# Patient Record
Sex: Male | Born: 1960 | Race: White | Hispanic: No | Smoking: Former smoker
Health system: Southern US, Community
[De-identification: ages and names within clinical notes are randomized; demographics above are authoritative.]

## PROBLEM LIST (undated history)

## (undated) ENCOUNTER — Emergency Department (HOSPITAL_COMMUNITY)

## (undated) DIAGNOSIS — C801 Malignant (primary) neoplasm, unspecified: Secondary | ICD-10-CM

## (undated) DIAGNOSIS — G8929 Other chronic pain: Secondary | ICD-10-CM

## (undated) DIAGNOSIS — J189 Pneumonia, unspecified organism: Secondary | ICD-10-CM

## (undated) DIAGNOSIS — F1910 Other psychoactive substance abuse, uncomplicated: Secondary | ICD-10-CM

## (undated) DIAGNOSIS — K089 Disorder of teeth and supporting structures, unspecified: Secondary | ICD-10-CM

## (undated) DIAGNOSIS — J449 Chronic obstructive pulmonary disease, unspecified: Secondary | ICD-10-CM

## (undated) HISTORY — PX: BACK SURGERY: SHX140

## (undated) HISTORY — PX: OTHER SURGICAL HISTORY: SHX169

---

## 2002-12-25 ENCOUNTER — Encounter: Payer: Self-pay | Admitting: Family Medicine

## 2002-12-25 ENCOUNTER — Ambulatory Visit (HOSPITAL_COMMUNITY): Admission: RE | Admit: 2002-12-25 | Discharge: 2002-12-25 | Payer: Self-pay | Admitting: Family Medicine

## 2004-11-29 ENCOUNTER — Emergency Department (HOSPITAL_COMMUNITY): Admission: EM | Admit: 2004-11-29 | Discharge: 2004-11-29 | Payer: Self-pay | Admitting: Emergency Medicine

## 2004-12-01 ENCOUNTER — Ambulatory Visit (HOSPITAL_BASED_OUTPATIENT_CLINIC_OR_DEPARTMENT_OTHER): Admission: RE | Admit: 2004-12-01 | Discharge: 2004-12-01 | Payer: Self-pay | Admitting: *Deleted

## 2004-12-01 ENCOUNTER — Ambulatory Visit (HOSPITAL_COMMUNITY): Admission: RE | Admit: 2004-12-01 | Discharge: 2004-12-01 | Payer: Self-pay | Admitting: *Deleted

## 2007-01-08 ENCOUNTER — Ambulatory Visit (HOSPITAL_COMMUNITY): Admission: RE | Admit: 2007-01-08 | Discharge: 2007-01-08 | Payer: Self-pay | Admitting: Family Medicine

## 2007-12-18 ENCOUNTER — Ambulatory Visit (HOSPITAL_COMMUNITY): Admission: RE | Admit: 2007-12-18 | Discharge: 2007-12-18 | Payer: Self-pay | Admitting: Family Medicine

## 2008-01-14 ENCOUNTER — Ambulatory Visit (HOSPITAL_COMMUNITY): Admission: RE | Admit: 2008-01-14 | Discharge: 2008-01-14 | Payer: Self-pay | Admitting: Family Medicine

## 2008-03-25 ENCOUNTER — Ambulatory Visit (HOSPITAL_COMMUNITY): Admission: RE | Admit: 2008-03-25 | Discharge: 2008-03-26 | Payer: Self-pay | Admitting: Neurosurgery

## 2008-09-14 ENCOUNTER — Inpatient Hospital Stay (HOSPITAL_COMMUNITY): Admission: EM | Admit: 2008-09-14 | Discharge: 2008-09-17 | Payer: Self-pay | Admitting: Emergency Medicine

## 2010-11-11 ENCOUNTER — Ambulatory Visit (HOSPITAL_COMMUNITY)
Admission: RE | Admit: 2010-11-11 | Discharge: 2010-11-11 | Payer: Self-pay | Source: Home / Self Care | Attending: Pulmonary Disease | Admitting: Pulmonary Disease
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
## 2011-03-01 NOTE — Op Note (Signed)
NAMEMARQUAY, Jerome Daniel                 ACCOUNT NO.:  [id number]   MEDICAL RECORD NO.:  [id number]          PATIENT TYPE:  OIB   LOCATION:  3029                         FACILITY:  [facility]   PHYSICIAN:  Payton Doughty, M.D.      DATE OF BIRTH:  1960/12/25   DATE OF PROCEDURE:  03/25/2008  DATE OF DISCHARGE:                               OPERATIVE REPORT   PREOPERATIVE DIAGNOSES:  1. Spondylosis L4-L5.  2. Spondylosis and herniated disk, L5-S1.   POSTOPERATIVE DIAGNOSES:  1. Spondylosis L4-L5.  2. Spondylosis and herniated disk, L5-S1.   PROCEDURES:  L4-L5, L5-S1 laminotomy, foraminotomy, and left L5-S1  diskectomy.   DICTATING DOCTOR:  Payton Doughty, MD   ANESTHESIA:  General endotracheal.   PREPARATION:  Prepped with alcohol wipe.   COMPLICATIONS:  None.   NURSE ASSISTANT:  Evon Slack   DOCTOR ASSISTANT:  Hewitt Shorts, MD   BODY TEXT:  A 50 year old gentleman with left L5-S1 radiculopathy, taken  to operating room, smoothly induced and intubated, placed prone on the  operating table.  Following shave, prep, and drape in the usual sterile  fashion, skin was infiltrated with 1% lidocaine with 1:400,000  epinephrine.  Skin was incised from the top of L4 to the bottom of L5,  and the lamina of L4-L5 were exposed on the left side in subperiosteal  plane.  An intraoperative x-ray confirmed correct level.  Having  confirmed correct level, hemi/semi-laminotomy was carried out at both L4  and L5 using the high-speed drill to the top of ligamentum flavum, which  was then removed with a Kerrison punch.  The laminotomy defect widened  laterally.  The S1 nerve root was identified, which was gently retracted  medially underneath.  There was found an annular tear with protruding  disk material.  This was removed without difficulty.  Disk space was  carefully explored.  No graspable fragments removed.  The anterior  epidural space was explored as well as neuroforamen found to be free.  At L4-L5, once the ligament have been removed, there was some bulging  disk demonstrated, but no frank herniated disk.  Generous foraminotomy  was carried out over the L5 root.  The nerve was explored in all  quadrant and found to be free.  The wound was irrigated.  Hemostasis  assured.  Depo-  Medrol soaked pad was used to fill both laminotomy defects and  successive layers of 0 Vicryl, 2-0 Vicryl, and 3-0 nylon were used to  close.  Betadine and Telfa dressing was applied, made occlusive to the  OpSite.  The patient returned to the recovery room in good condition.           ______________________________  Payton Doughty, M.D.     MWR/MEDQ  D:  03/25/2008  T:  03/26/2008  Job:  [id number]

## 2011-03-04 NOTE — Op Note (Signed)
NAMEMYLIN, HIRANO                 ACCOUNT NO.:  [id number]   MEDICAL RECORD NO.:  [id number]          PATIENT TYPE:  AMB   LOCATION:  DSC                          FACILITY:  [facility]   PHYSICIAN:  Tennis Must Meyerdierks, M.D.DATE OF BIRTH:  1961/01/13   DATE OF PROCEDURE:  12/01/2004  DATE OF DISCHARGE:                                 OPERATIVE REPORT   PREOPERATIVE DIAGNOSIS:  Comminuted intra-articular fracture distal phalanx,  right thumb.   POSTOPERATIVE DIAGNOSIS:  Comminuted intra-articular fracture distal  phalanx, right thumb.   PROCEDURE:  Closed reduction and percutaneous pinning of distal phalanx  fracture, right thumb.   SURGEON:  Lowell Bouton, M.D.   ANESTHESIA:  General.   OPERATIVE FINDINGS:  The patient had a fracture that extended through the  radial condyle of the distal phalanx and also a mallet type avulsion  fracture dorsally.  The distal phalanx was subluxed volarly. There was  significant comminution of the fracture at the IP joint.   PROCEDURE:  Under general anesthesia, the right hand was prepped and draped  in the usual fashion and under x-ray control, a 0.045 K-wire was placed  longitudinally through the distal phalanx.  The IP joint was reduced and the  K-wire was passed across the IP joint. X-ray showed good alignment. A second  0.045 K-wire was then used through the radial condyle fracture and it was  pinned from radial to ulnar to the distal phalanx. A third 0.045 K-wire was  then used dorsally to pin the dorsal fragment back to the distal phalanx.  The pins were bent over and left protruding from the skin. X-ray showed good  alignment. Then 0.5% Marcaine digital block was inserted in the thumb for  pain control. The patient was splinted with an Alumafoam splint. He went to  the recovery room awake, stable, in good condition.      EMM/MEDQ  D:  12/01/2004  T:  12/01/2004  Job:  [id number]   cc:   Teola Bradley, M.D.  [phone]  [place] [address]  Fax: [phone]

## 2011-04-04 ENCOUNTER — Other Ambulatory Visit (HOSPITAL_COMMUNITY): Payer: Self-pay | Admitting: Pulmonary Disease

## 2011-04-04 DIAGNOSIS — R911 Solitary pulmonary nodule: Secondary | ICD-10-CM

## 2011-04-25 ENCOUNTER — Ambulatory Visit (HOSPITAL_COMMUNITY)
Admission: RE | Admit: 2011-04-25 | Discharge: 2011-04-25 | Disposition: A | Discharge status: Home / Self Care | Payer: PRIVATE HEALTH INSURANCE | Source: Ambulatory Visit | Attending: Pulmonary Disease | Admitting: Pulmonary Disease

## 2011-04-25 DIAGNOSIS — J984 Other disorders of lung: Secondary | ICD-10-CM | POA: Insufficient documentation

## 2011-04-25 DIAGNOSIS — R911 Solitary pulmonary nodule: Secondary | ICD-10-CM

## 2011-07-14 LAB — CBC
HCT: 43.5
Hemoglobin: 15.2
RDW: 12.8
WBC: 7

## 2011-07-14 LAB — URINALYSIS, ROUTINE W REFLEX MICROSCOPIC
Bilirubin Urine: NEGATIVE
Hgb urine dipstick: NEGATIVE
Ketones, ur: NEGATIVE
Specific Gravity, Urine: 1.029

## 2011-07-14 LAB — COMPREHENSIVE METABOLIC PANEL
ALT: 37
AST: 24
Alkaline Phosphatase: 42
BUN: 11
Calcium: 9.4
GFR calc non Af Amer: >60
Sodium: 135

## 2011-07-14 LAB — PROTIME-INR
INR: 0.9
Prothrombin Time: 12.1

## 2011-07-14 LAB — DIFFERENTIAL
Basophils Absolute: 0.1
Eosinophils Absolute: 0.1
Eosinophils Relative: 2
Lymphocytes Relative: 26
Lymphs Abs: 1.8
Monocytes Absolute: 0.6
Neutro Abs: 4.4
Neutrophils Relative %: 64

## 2011-07-14 LAB — APTT: aPTT: 33

## 2011-07-19 LAB — CBC
HCT: 39.6 % (ref 39.0–52.0)
MCHC: 34.3 g/dL (ref 30.0–36.0)
MCV: 96.8 fL (ref 78.0–100.0)
MCV: 99.1 fL (ref 78.0–100.0)
Platelets: 233 10*3/uL (ref 150–400)
RBC: 3.68 MIL/uL — ABNORMAL LOW (ref 4.22–5.81)
RDW: 12.7 % (ref 11.5–15.5)
RDW: 13.3 % (ref 11.5–15.5)
WBC: 7.1 10*3/uL (ref 4.0–10.5)
WBC: 7.2 10*3/uL (ref 4.0–10.5)

## 2011-07-19 LAB — TYPE AND SCREEN: ABO/RH(D): O NEG

## 2011-07-19 LAB — POCT I-STAT, CHEM 8
Calcium, Ion: 1.12 mmol/L (ref 1.12–1.32)
Chloride: 105 mEq/L (ref 96–112)
Potassium: 4.3 mEq/L (ref 3.5–5.1)
Sodium: 138 mEq/L (ref 135–145)
TCO2: 24 mmol/L (ref 0–100)

## 2011-07-19 LAB — BASIC METABOLIC PANEL
BUN: 7 mg/dL (ref 6–23)
CO2: 25 mEq/L (ref 19–32)
Calcium: 7.6 mg/dL — ABNORMAL LOW (ref 8.4–10.5)
Sodium: 134 mEq/L — ABNORMAL LOW (ref 135–145)

## 2011-07-19 LAB — ABO/RH: ABO/RH(D): O NEG

## 2011-07-22 LAB — BASIC METABOLIC PANEL
CO2: 27 mEq/L (ref 19–32)
Calcium: 8.2 mg/dL — ABNORMAL LOW (ref 8.4–10.5)
Chloride: 101 mEq/L (ref 96–112)
Creatinine, Ser: 0.85 mg/dL (ref 0.4–1.5)
GFR calc Af Amer: >60 mL/min (ref >60)
Sodium: 136 mEq/L (ref 135–145)

## 2011-07-22 LAB — CBC
Hemoglobin: 12.1 g/dL — ABNORMAL LOW (ref 13.0–17.0)
MCHC: 34.5 g/dL (ref 30.0–36.0)
RBC: 3.55 MIL/uL — ABNORMAL LOW (ref 4.22–5.81)
WBC: 6.1 10*3/uL (ref 4.0–10.5)

## 2011-11-06 DIAGNOSIS — Z720 Tobacco use: Secondary | ICD-10-CM | POA: Insufficient documentation

## 2012-07-24 ENCOUNTER — Emergency Department (HOSPITAL_COMMUNITY)
Admission: EM | Admit: 2012-07-24 | Discharge: 2012-07-24 | Disposition: A | Discharge status: Home / Self Care | Payer: Self-pay | Attending: Emergency Medicine | Admitting: Emergency Medicine

## 2012-07-24 ENCOUNTER — Encounter (HOSPITAL_COMMUNITY): Payer: Self-pay | Admitting: *Deleted

## 2012-07-24 DIAGNOSIS — F1010 Alcohol abuse, uncomplicated: Secondary | ICD-10-CM | POA: Insufficient documentation

## 2012-07-24 DIAGNOSIS — F1310 Sedative, hypnotic or anxiolytic abuse, uncomplicated: Secondary | ICD-10-CM | POA: Insufficient documentation

## 2012-07-24 DIAGNOSIS — F1110 Opioid abuse, uncomplicated: Secondary | ICD-10-CM

## 2012-07-24 DIAGNOSIS — J449 Chronic obstructive pulmonary disease, unspecified: Secondary | ICD-10-CM | POA: Insufficient documentation

## 2012-07-24 DIAGNOSIS — J4489 Other specified chronic obstructive pulmonary disease: Secondary | ICD-10-CM | POA: Insufficient documentation

## 2012-07-24 HISTORY — DX: Chronic obstructive pulmonary disease, unspecified: J44.9

## 2012-07-24 LAB — CBC WITH DIFFERENTIAL/PLATELET
Eosinophils Absolute: 0.4 10*3/uL (ref 0.0–0.7)
Eosinophils Relative: 6 % — ABNORMAL HIGH (ref 0–5)
HCT: 44.1 % (ref 39.0–52.0)
Lymphocytes Relative: 34 % (ref 12–46)
Lymphs Abs: 2.2 10*3/uL (ref 0.7–4.0)
MCH: 34.1 pg — ABNORMAL HIGH (ref 26.0–34.0)
MCV: 102.3 fL — ABNORMAL HIGH (ref 78.0–100.0)
Monocytes Absolute: 0.3 10*3/uL (ref 0.1–1.0)
Monocytes Relative: 5 % (ref 3–12)
RBC: 4.31 MIL/uL (ref 4.22–5.81)
WBC: 6.6 10*3/uL (ref 4.0–10.5)

## 2012-07-24 LAB — COMPREHENSIVE METABOLIC PANEL
ALT: 30 U/L (ref 0–53)
BUN: 7 mg/dL (ref 6–23)
CO2: 30 mEq/L (ref 19–32)
Calcium: 9.4 mg/dL (ref 8.4–10.5)
Creatinine, Ser: 0.89 mg/dL (ref 0.50–1.35)
GFR calc Af Amer: >90 mL/min (ref >90)
GFR calc non Af Amer: >90 mL/min (ref >90)
Glucose, Bld: 100 mg/dL — ABNORMAL HIGH (ref 70–99)
Total Protein: 7.3 g/dL (ref 6.0–8.3)

## 2012-07-24 LAB — ETHANOL: Alcohol, Ethyl (B): 12 mg/dL — ABNORMAL HIGH (ref 0–11)

## 2012-07-24 LAB — RAPID URINE DRUG SCREEN, HOSP PERFORMED
Benzodiazepines: NOT DETECTED
Cocaine: NOT DETECTED
Opiates: NOT DETECTED

## 2012-07-24 MED ORDER — IBUPROFEN 800 MG PO TABS
800.0000 mg | ORAL_TABLET | Freq: Once | ORAL | Status: AC
  Administered 2012-07-24: 800 mg via ORAL
  Filled 2012-07-24: qty 1

## 2012-07-24 NOTE — ED Notes (Signed)
Wants to get off pain pills,tylox,

## 2012-07-24 NOTE — Progress Notes (Signed)
PT ACCEPTED AT [facility] BY [facility]. CALLED [facility], SPOKE WITH KATHY RECEIVED AUTHORIZATION # [id number].  INFORMED SANDY OF AUTHORIZATION #.  PT IS READY FOR D/C AND IS BEING TRANSPORTED TO [facility] BY HIS GIRLFRIEND
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
## 2012-07-24 NOTE — ED Notes (Signed)
Pt accepted at [facility] per Samson Frederic with ACT team
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
## 2012-11-24 ENCOUNTER — Emergency Department (HOSPITAL_COMMUNITY): Payer: Self-pay

## 2012-11-24 ENCOUNTER — Emergency Department (HOSPITAL_COMMUNITY)
Admission: EM | Admit: 2012-11-24 | Discharge: 2012-11-24 | Disposition: A | Discharge status: Home / Self Care | Payer: Self-pay | Attending: Emergency Medicine | Admitting: Emergency Medicine

## 2012-11-24 ENCOUNTER — Encounter (HOSPITAL_COMMUNITY): Payer: Self-pay

## 2012-11-24 DIAGNOSIS — Z79899 Other long term (current) drug therapy: Secondary | ICD-10-CM | POA: Insufficient documentation

## 2012-11-24 DIAGNOSIS — Z87891 Personal history of nicotine dependence: Secondary | ICD-10-CM | POA: Insufficient documentation

## 2012-11-24 DIAGNOSIS — Z87828 Personal history of other (healed) physical injury and trauma: Secondary | ICD-10-CM | POA: Insufficient documentation

## 2012-11-24 DIAGNOSIS — R059 Cough, unspecified: Secondary | ICD-10-CM | POA: Insufficient documentation

## 2012-11-24 DIAGNOSIS — J441 Chronic obstructive pulmonary disease with (acute) exacerbation: Secondary | ICD-10-CM | POA: Insufficient documentation

## 2012-11-24 DIAGNOSIS — R062 Wheezing: Secondary | ICD-10-CM | POA: Insufficient documentation

## 2012-11-24 DIAGNOSIS — R05 Cough: Secondary | ICD-10-CM | POA: Insufficient documentation

## 2012-11-24 LAB — CBC WITH DIFFERENTIAL/PLATELET
Eosinophils Relative: 8 % — ABNORMAL HIGH (ref 0–5)
Hemoglobin: 17.7 g/dL — ABNORMAL HIGH (ref 13.0–17.0)
Lymphocytes Relative: 31 % (ref 12–46)
Lymphs Abs: 2.2 10*3/uL (ref 0.7–4.0)
MCV: 101.1 fL — ABNORMAL HIGH (ref 78.0–100.0)
Monocytes Relative: 5 % (ref 3–12)
Platelets: 205 10*3/uL (ref 150–400)
RBC: 5.27 MIL/uL (ref 4.22–5.81)
WBC: 7.2 10*3/uL (ref 4.0–10.5)

## 2012-11-24 LAB — COMPREHENSIVE METABOLIC PANEL
ALT: 21 U/L (ref 0–53)
Alkaline Phosphatase: 63 U/L (ref 39–117)
CO2: 31 mEq/L (ref 19–32)
GFR calc Af Amer: >90 mL/min (ref >90)
GFR calc non Af Amer: >90 mL/min (ref >90)
Glucose, Bld: 101 mg/dL — ABNORMAL HIGH (ref 70–99)
Potassium: 4.3 mEq/L (ref 3.5–5.1)
Sodium: 138 mEq/L (ref 135–145)

## 2012-11-24 LAB — URINALYSIS, ROUTINE W REFLEX MICROSCOPIC
Bilirubin Urine: NEGATIVE
Ketones, ur: NEGATIVE mg/dL
Nitrite: NEGATIVE
Protein, ur: NEGATIVE mg/dL
Urobilinogen, UA: 0.2 mg/dL (ref 0.0–1.0)

## 2012-11-24 MED ORDER — PREDNISONE 20 MG PO TABS: ORAL_TABLET | ORAL | Status: DC

## 2012-11-24 MED ORDER — OXYCODONE-ACETAMINOPHEN 5-325 MG PO TABS
1.0000 | ORAL_TABLET | Freq: Once | ORAL | Status: AC
  Administered 2012-11-24: 1 via ORAL
  Filled 2012-11-24: qty 1

## 2012-11-24 MED ORDER — ALBUTEROL SULFATE HFA 108 (90 BASE) MCG/ACT IN AERS
2.0000 | INHALATION_SPRAY | RESPIRATORY_TRACT | Status: DC | PRN
  Administered 2012-11-24: 2 via RESPIRATORY_TRACT
  Filled 2012-11-24: qty 6.7

## 2012-11-24 MED ORDER — METHYLPREDNISOLONE SODIUM SUCC 125 MG IJ SOLR
125.0000 mg | Freq: Once | INTRAMUSCULAR | Status: AC
  Administered 2012-11-24: 125 mg via INTRAVENOUS
  Filled 2012-11-24: qty 2

## 2012-11-24 MED ORDER — ALBUTEROL (5 MG/ML) CONTINUOUS INHALATION SOLN
10.0000 mg/h | INHALATION_SOLUTION | RESPIRATORY_TRACT | Status: DC
  Administered 2012-11-24: 10 mg/h via RESPIRATORY_TRACT
  Filled 2012-11-24: qty 20

## 2012-11-24 MED ORDER — SODIUM CHLORIDE 0.9 % IV SOLN
INTRAVENOUS | Status: DC
  Administered 2012-11-24: 150 mL/h via INTRAVENOUS

## 2012-11-24 NOTE — ED Notes (Signed)
Pt c/o SOB since last night.  Reports " little" productive cough.  Denies pain or fever.  Pt has audible wheezing.

## 2012-11-24 NOTE — ED Notes (Signed)
Pt states that he is feeling better, pt and family updated on plan of care,
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
## 2012-11-24 NOTE — ED Notes (Signed)
Pt returned from xray, requesting pain medication for a headache, pt states "i need oxycodone or something strong,  advil or anything like that will not cut it for me", Dr Ignacia Palma notified additional orders given

## 2012-11-24 NOTE — ED Notes (Signed)
Pt arrives to er with c/o sob, wheezing, pt states that he first noticed having to use his inhaler more over the past month, cough is productive at times, unsure of any color, states "I don't look at it", during the night and this am inhaler did not seem to help with the wheezing, pt has audible expiratory wheezing noted on arrival to tx room,. Dr. Ignacia Palma at bedside,

## 2013-01-04 ENCOUNTER — Encounter (HOSPITAL_COMMUNITY): Payer: Self-pay | Admitting: *Deleted

## 2013-01-04 ENCOUNTER — Emergency Department (HOSPITAL_COMMUNITY)
Admission: EM | Admit: 2013-01-04 | Discharge: 2013-01-04 | Disposition: A | Discharge status: Home / Self Care | Payer: Self-pay | Attending: Emergency Medicine | Admitting: Emergency Medicine

## 2013-01-04 DIAGNOSIS — Z87891 Personal history of nicotine dependence: Secondary | ICD-10-CM | POA: Insufficient documentation

## 2013-01-04 DIAGNOSIS — Z79899 Other long term (current) drug therapy: Secondary | ICD-10-CM | POA: Insufficient documentation

## 2013-01-04 DIAGNOSIS — J449 Chronic obstructive pulmonary disease, unspecified: Secondary | ICD-10-CM

## 2013-01-04 DIAGNOSIS — R059 Cough, unspecified: Secondary | ICD-10-CM | POA: Insufficient documentation

## 2013-01-04 DIAGNOSIS — R05 Cough: Secondary | ICD-10-CM | POA: Insufficient documentation

## 2013-01-04 DIAGNOSIS — J441 Chronic obstructive pulmonary disease with (acute) exacerbation: Secondary | ICD-10-CM | POA: Insufficient documentation

## 2013-01-04 MED ORDER — PREDNISONE 50 MG PO TABS
60.0000 mg | ORAL_TABLET | Freq: Once | ORAL | Status: AC
  Administered 2013-01-04: 60 mg via ORAL
  Filled 2013-01-04: qty 1

## 2013-01-04 MED ORDER — ALBUTEROL SULFATE HFA 108 (90 BASE) MCG/ACT IN AERS
2.0000 | INHALATION_SPRAY | Freq: Once | RESPIRATORY_TRACT | Status: AC
  Administered 2013-01-04: 2 via RESPIRATORY_TRACT
  Filled 2013-01-04: qty 6.7

## 2013-01-04 MED ORDER — PREDNISONE 20 MG PO TABS: ORAL_TABLET | ORAL | Status: DC

## 2013-01-04 NOTE — ED Notes (Signed)
Once Respiratory did inhaler, pt left without receiving d/c instructions

## 2013-01-04 NOTE — ED Notes (Signed)
Pt states he has ran out of inhaler and usually uses 3-4 x/day, + cough and productive at times per pt, exp. Wheezing noted in all 4 fields

## 2013-01-04 NOTE — ED Notes (Signed)
Patient was informed that there is another MD that has just arrived and he shouldn't have to wait to much longer to see a physician.
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
## 2013-01-04 NOTE — ED Notes (Signed)
Patient is upset that a MD has not been in his room at this time. RN made aware.

## 2013-01-04 NOTE — ED Notes (Signed)
Ran out of inhaler today.  States here for another one.  Has rx for same at pharmacy but does not have the money to pay.  Speaking clear full sentences in triage.  No resp distress noted.

## 2013-01-04 NOTE — ED Notes (Signed)
MD at bedside. 

## 2013-03-05 ENCOUNTER — Emergency Department (HOSPITAL_COMMUNITY): Payer: Self-pay

## 2013-03-05 ENCOUNTER — Emergency Department (HOSPITAL_COMMUNITY)
Admission: EM | Admit: 2013-03-05 | Discharge: 2013-03-05 | Disposition: A | Discharge status: Home / Self Care | Payer: Self-pay | Attending: Emergency Medicine | Admitting: Emergency Medicine

## 2013-03-05 ENCOUNTER — Encounter (HOSPITAL_COMMUNITY): Payer: Self-pay | Admitting: *Deleted

## 2013-03-05 DIAGNOSIS — J441 Chronic obstructive pulmonary disease with (acute) exacerbation: Secondary | ICD-10-CM | POA: Insufficient documentation

## 2013-03-05 DIAGNOSIS — R05 Cough: Secondary | ICD-10-CM | POA: Insufficient documentation

## 2013-03-05 DIAGNOSIS — F17200 Nicotine dependence, unspecified, uncomplicated: Secondary | ICD-10-CM | POA: Insufficient documentation

## 2013-03-05 DIAGNOSIS — Z87828 Personal history of other (healed) physical injury and trauma: Secondary | ICD-10-CM | POA: Insufficient documentation

## 2013-03-05 DIAGNOSIS — J449 Chronic obstructive pulmonary disease, unspecified: Secondary | ICD-10-CM

## 2013-03-05 DIAGNOSIS — R059 Cough, unspecified: Secondary | ICD-10-CM | POA: Insufficient documentation

## 2013-03-05 DIAGNOSIS — Z79899 Other long term (current) drug therapy: Secondary | ICD-10-CM | POA: Insufficient documentation

## 2013-03-05 LAB — CBC WITH DIFFERENTIAL/PLATELET
Eosinophils Absolute: 0.7 10*3/uL (ref 0.0–0.7)
Eosinophils Relative: 7 % — ABNORMAL HIGH (ref 0–5)
HCT: 49.7 % (ref 39.0–52.0)
Hemoglobin: 16.7 g/dL (ref 13.0–17.0)
Lymphs Abs: 3.3 10*3/uL (ref 0.7–4.0)
MCH: 32.8 pg (ref 26.0–34.0)
MCV: 97.6 fL (ref 78.0–100.0)
Monocytes Relative: 5 % (ref 3–12)
RBC: 5.09 MIL/uL (ref 4.22–5.81)

## 2013-03-05 LAB — COMPREHENSIVE METABOLIC PANEL
Alkaline Phosphatase: 62 U/L (ref 39–117)
BUN: 11 mg/dL (ref 6–23)
Calcium: 8.6 mg/dL (ref 8.4–10.5)
GFR calc Af Amer: >90 mL/min (ref >90)
Glucose, Bld: 88 mg/dL (ref 70–99)
Potassium: 3.7 mEq/L (ref 3.5–5.1)
Total Protein: 7.5 g/dL (ref 6.0–8.3)

## 2013-03-05 MED ORDER — ALBUTEROL SULFATE HFA 108 (90 BASE) MCG/ACT IN AERS
2.0000 | INHALATION_SPRAY | RESPIRATORY_TRACT | Status: DC | PRN
  Filled 2013-03-05: qty 6.7

## 2013-03-05 MED ORDER — PREDNISONE 10 MG PO TABS: 20.0000 mg | ORAL_TABLET | Freq: Every day | ORAL | Status: DC

## 2013-03-05 MED ORDER — ALBUTEROL SULFATE HFA 108 (90 BASE) MCG/ACT IN AERS: 1.0000 | INHALATION_SPRAY | Freq: Four times a day (QID) | RESPIRATORY_TRACT | Status: DC | PRN

## 2013-03-05 MED ORDER — IPRATROPIUM BROMIDE 0.02 % IN SOLN
0.5000 mg | Freq: Once | RESPIRATORY_TRACT | Status: AC
  Administered 2013-03-05: 0.5 mg via RESPIRATORY_TRACT
  Filled 2013-03-05: qty 2.5

## 2013-03-05 MED ORDER — ALBUTEROL SULFATE (5 MG/ML) 0.5% IN NEBU
2.5000 mg | INHALATION_SOLUTION | Freq: Once | RESPIRATORY_TRACT | Status: AC
  Administered 2013-03-05: 2.5 mg via RESPIRATORY_TRACT
  Filled 2013-03-05: qty 0.5

## 2013-03-05 MED ORDER — AMOXICILLIN 500 MG PO CAPS: 500.0000 mg | ORAL_CAPSULE | Freq: Three times a day (TID) | ORAL | Status: DC

## 2013-03-05 MED ORDER — METHYLPREDNISOLONE SODIUM SUCC 125 MG IJ SOLR
125.0000 mg | Freq: Once | INTRAMUSCULAR | Status: AC
  Administered 2013-03-05: 125 mg via INTRAVENOUS
  Filled 2013-03-05: qty 2

## 2013-03-05 NOTE — ED Notes (Signed)
Still has expiratory wheezing but pt states he feels much better. Discussed options for filling medication RX. ( $4 rx @ [facility], forgo 2 packs of cigarretts for RX)

## 2013-03-05 NOTE — ED Notes (Signed)
Sob, I ran out of my inhaler, I cannot afford  Them.
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
## 2013-03-11 ENCOUNTER — Emergency Department (HOSPITAL_COMMUNITY): Payer: Self-pay

## 2013-03-11 ENCOUNTER — Encounter (HOSPITAL_COMMUNITY): Payer: Self-pay | Admitting: *Deleted

## 2013-03-11 ENCOUNTER — Emergency Department (HOSPITAL_COMMUNITY)
Admission: EM | Admit: 2013-03-11 | Discharge: 2013-03-12 | Disposition: A | Discharge status: Home / Self Care | Payer: Self-pay | Attending: Emergency Medicine | Admitting: Emergency Medicine

## 2013-03-11 DIAGNOSIS — R059 Cough, unspecified: Secondary | ICD-10-CM | POA: Insufficient documentation

## 2013-03-11 DIAGNOSIS — IMO0002 Reserved for concepts with insufficient information to code with codable children: Secondary | ICD-10-CM | POA: Insufficient documentation

## 2013-03-11 DIAGNOSIS — R05 Cough: Secondary | ICD-10-CM | POA: Insufficient documentation

## 2013-03-11 DIAGNOSIS — R51 Headache: Secondary | ICD-10-CM | POA: Insufficient documentation

## 2013-03-11 DIAGNOSIS — F17200 Nicotine dependence, unspecified, uncomplicated: Secondary | ICD-10-CM | POA: Insufficient documentation

## 2013-03-11 DIAGNOSIS — Z79899 Other long term (current) drug therapy: Secondary | ICD-10-CM | POA: Insufficient documentation

## 2013-03-11 DIAGNOSIS — J3489 Other specified disorders of nose and nasal sinuses: Secondary | ICD-10-CM | POA: Insufficient documentation

## 2013-03-11 DIAGNOSIS — J441 Chronic obstructive pulmonary disease with (acute) exacerbation: Secondary | ICD-10-CM | POA: Insufficient documentation

## 2013-03-11 MED ORDER — MOMETASONE FUROATE 50 MCG/ACT NA SUSP: 2.0000 | Freq: Every day | NASAL | Status: DC

## 2013-03-11 MED ORDER — OXYMETAZOLINE HCL 0.05 % NA SOLN: 2.0000 | Freq: Two times a day (BID) | NASAL | Status: DC

## 2013-03-11 MED ORDER — PREDNISONE 20 MG PO TABS: 40.0000 mg | ORAL_TABLET | Freq: Every day | ORAL | Status: DC

## 2013-03-11 MED ORDER — IBUPROFEN 400 MG PO TABS
600.0000 mg | ORAL_TABLET | Freq: Once | ORAL | Status: AC
  Administered 2013-03-11: 600 mg via ORAL
  Filled 2013-03-11: qty 2

## 2013-03-11 MED ORDER — GUAIFENESIN 200 MG PO TABS: 400.0000 mg | ORAL_TABLET | ORAL | Status: DC | PRN

## 2013-03-11 MED ORDER — METHYLPREDNISOLONE SODIUM SUCC 125 MG IJ SOLR
125.0000 mg | Freq: Once | INTRAMUSCULAR | Status: AC
  Administered 2013-03-11: 125 mg via INTRAMUSCULAR
  Filled 2013-03-11: qty 2

## 2013-03-11 MED ORDER — ALBUTEROL (5 MG/ML) CONTINUOUS INHALATION SOLN
10.0000 mg/h | INHALATION_SOLUTION | RESPIRATORY_TRACT | Status: DC
  Administered 2013-03-11: 10 mg/h via RESPIRATORY_TRACT
  Filled 2013-03-11: qty 20

## 2013-03-11 MED ORDER — TRAMADOL HCL 50 MG PO TABS
50.0000 mg | ORAL_TABLET | Freq: Once | ORAL | Status: AC
  Administered 2013-03-11: 50 mg via ORAL
  Filled 2013-03-11: qty 1

## 2013-03-11 NOTE — ED Notes (Signed)
States he has the worst headache he has ever had

## 2013-03-11 NOTE — ED Notes (Signed)
Short of breath for the past few hours
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
## 2013-03-11 NOTE — ED Notes (Signed)
Pt c/o severe headache and wheezing. Pt states she was seen here last week for sob.

## 2013-03-13 ENCOUNTER — Encounter (HOSPITAL_COMMUNITY): Payer: Self-pay | Admitting: *Deleted

## 2013-03-13 ENCOUNTER — Emergency Department (HOSPITAL_COMMUNITY)
Admission: EM | Admit: 2013-03-13 | Discharge: 2013-03-14 | Disposition: A | Discharge status: Home / Self Care | Payer: Self-pay | Attending: Emergency Medicine | Admitting: Emergency Medicine

## 2013-03-13 DIAGNOSIS — R51 Headache: Secondary | ICD-10-CM | POA: Insufficient documentation

## 2013-03-13 DIAGNOSIS — F17200 Nicotine dependence, unspecified, uncomplicated: Secondary | ICD-10-CM | POA: Insufficient documentation

## 2013-03-13 DIAGNOSIS — J441 Chronic obstructive pulmonary disease with (acute) exacerbation: Secondary | ICD-10-CM | POA: Insufficient documentation

## 2013-03-13 DIAGNOSIS — Z79899 Other long term (current) drug therapy: Secondary | ICD-10-CM | POA: Insufficient documentation

## 2013-03-13 DIAGNOSIS — J40 Bronchitis, not specified as acute or chronic: Secondary | ICD-10-CM

## 2013-03-13 MED ORDER — ALBUTEROL SULFATE (5 MG/ML) 0.5% IN NEBU
5.0000 mg | INHALATION_SOLUTION | Freq: Once | RESPIRATORY_TRACT | Status: AC
  Administered 2013-03-13: 5 mg via RESPIRATORY_TRACT

## 2013-03-13 MED ORDER — IPRATROPIUM BROMIDE 0.02 % IN SOLN
0.5000 mg | Freq: Once | RESPIRATORY_TRACT | Status: AC
  Administered 2013-03-13: 0.5 mg via RESPIRATORY_TRACT
  Filled 2013-03-13: qty 2.5

## 2013-03-13 NOTE — ED Notes (Signed)
Pt c/o sob x 3hrs

## 2013-03-14 ENCOUNTER — Emergency Department (HOSPITAL_COMMUNITY): Payer: Self-pay

## 2013-03-14 LAB — URINALYSIS, ROUTINE W REFLEX MICROSCOPIC
Ketones, ur: NEGATIVE mg/dL
Leukocytes, UA: NEGATIVE
Protein, ur: NEGATIVE mg/dL
Urobilinogen, UA: 0.2 mg/dL (ref 0.0–1.0)

## 2013-03-14 LAB — BLOOD GAS, ARTERIAL
Drawn by: 22223
O2 Content: 2 L/min
pCO2 arterial: 47.4 mmHg — ABNORMAL HIGH (ref 35.0–45.0)
pH, Arterial: 7.335 — ABNORMAL LOW (ref 7.350–7.450)
pO2, Arterial: 107 mmHg — ABNORMAL HIGH (ref 80.0–100.0)

## 2013-03-14 LAB — CBC WITH DIFFERENTIAL/PLATELET
Basophils Absolute: 0 10*3/uL (ref 0.0–0.1)
HCT: 48.5 % (ref 39.0–52.0)
Lymphocytes Relative: 45 % (ref 12–46)
Monocytes Absolute: 0.4 10*3/uL (ref 0.1–1.0)
Neutro Abs: 4.1 10*3/uL (ref 1.7–7.7)
Neutrophils Relative %: 46 % (ref 43–77)
RDW: 13.8 % (ref 11.5–15.5)
WBC: 8.8 10*3/uL (ref 4.0–10.5)

## 2013-03-14 LAB — ETHANOL: Alcohol, Ethyl (B): 31 mg/dL — ABNORMAL HIGH (ref 0–11)

## 2013-03-14 LAB — BASIC METABOLIC PANEL
CO2: 25 mEq/L (ref 19–32)
Chloride: 102 mEq/L (ref 96–112)
Potassium: 3.4 mEq/L — ABNORMAL LOW (ref 3.5–5.1)
Sodium: 141 mEq/L (ref 135–145)

## 2013-03-14 LAB — RAPID URINE DRUG SCREEN, HOSP PERFORMED
Opiates: NOT DETECTED
Tetrahydrocannabinol: NOT DETECTED

## 2013-03-14 MED ORDER — AEROCHAMBER Z-STAT PLUS/MEDIUM MISC
1.0000 | Freq: Once | Status: AC
  Administered 2013-03-14: 1
  Filled 2013-03-14: qty 1

## 2013-03-14 MED ORDER — ALBUTEROL SULFATE HFA 108 (90 BASE) MCG/ACT IN AERS
2.0000 | INHALATION_SPRAY | RESPIRATORY_TRACT | Status: DC | PRN
  Administered 2013-03-14: 2 via RESPIRATORY_TRACT
  Filled 2013-03-14: qty 6.7

## 2013-03-14 NOTE — ED Notes (Signed)
Patient in room resting with eyes closed and snoring. Dr.wentz in room and unable to talk patient

## 2013-03-14 NOTE — ED Notes (Signed)
Patient remains asleep.

## 2013-03-14 NOTE — ED Notes (Signed)
Patient went to ct and refused the ct.

## 2013-03-14 NOTE — ED Notes (Signed)
Patient in room snoring. No distress noted

## 2013-03-14 NOTE — ED Notes (Signed)
Patient in room sleeping and resting comfortably
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
## 2013-03-22 ENCOUNTER — Other Ambulatory Visit (HOSPITAL_COMMUNITY): Payer: Self-pay | Admitting: *Deleted

## 2013-03-22 ENCOUNTER — Ambulatory Visit (HOSPITAL_COMMUNITY)
Admission: RE | Admit: 2013-03-22 | Discharge: 2013-03-22 | Disposition: A | Discharge status: Home / Self Care | Payer: Self-pay | Source: Ambulatory Visit | Attending: Pulmonary Disease | Admitting: Pulmonary Disease

## 2013-03-22 DIAGNOSIS — M51379 Other intervertebral disc degeneration, lumbosacral region without mention of lumbar back pain or lower extremity pain: Secondary | ICD-10-CM | POA: Insufficient documentation

## 2013-03-22 DIAGNOSIS — M5137 Other intervertebral disc degeneration, lumbosacral region: Secondary | ICD-10-CM | POA: Insufficient documentation

## 2013-03-22 DIAGNOSIS — M549 Dorsalgia, unspecified: Secondary | ICD-10-CM

## 2013-03-22 DIAGNOSIS — M545 Low back pain, unspecified: Secondary | ICD-10-CM | POA: Insufficient documentation

## 2013-03-27 ENCOUNTER — Emergency Department (HOSPITAL_COMMUNITY)
Admission: EM | Admit: 2013-03-27 | Discharge: 2013-03-27 | Disposition: A | Discharge status: Home / Self Care | Payer: Self-pay | Attending: Emergency Medicine | Admitting: Emergency Medicine

## 2013-03-27 ENCOUNTER — Encounter (HOSPITAL_COMMUNITY): Payer: Self-pay | Admitting: Emergency Medicine

## 2013-03-27 DIAGNOSIS — J441 Chronic obstructive pulmonary disease with (acute) exacerbation: Secondary | ICD-10-CM | POA: Insufficient documentation

## 2013-03-27 DIAGNOSIS — Z79899 Other long term (current) drug therapy: Secondary | ICD-10-CM | POA: Insufficient documentation

## 2013-03-27 DIAGNOSIS — Z760 Encounter for issue of repeat prescription: Secondary | ICD-10-CM | POA: Insufficient documentation

## 2013-03-27 DIAGNOSIS — F17200 Nicotine dependence, unspecified, uncomplicated: Secondary | ICD-10-CM | POA: Insufficient documentation

## 2013-03-27 MED ORDER — ALBUTEROL SULFATE (5 MG/ML) 0.5% IN NEBU
2.5000 mg | INHALATION_SOLUTION | Freq: Once | RESPIRATORY_TRACT | Status: AC
  Administered 2013-03-27: 2.5 mg via RESPIRATORY_TRACT
  Filled 2013-03-27: qty 1

## 2013-03-27 MED ORDER — IPRATROPIUM BROMIDE 0.02 % IN SOLN: 0.5000 mg | Freq: Once | RESPIRATORY_TRACT | Status: DC

## 2013-03-27 MED ORDER — ALBUTEROL SULFATE HFA 108 (90 BASE) MCG/ACT IN AERS
INHALATION_SPRAY | RESPIRATORY_TRACT | Status: AC
  Administered 2013-03-27: 17:00:00
  Filled 2013-03-27: qty 6.7

## 2013-03-27 MED ORDER — ALBUTEROL SULFATE (5 MG/ML) 0.5% IN NEBU: 5.0000 mg | INHALATION_SOLUTION | Freq: Once | RESPIRATORY_TRACT | Status: DC

## 2013-03-27 MED ORDER — IPRATROPIUM BROMIDE 0.02 % IN SOLN
0.5000 mg | Freq: Once | RESPIRATORY_TRACT | Status: AC
  Administered 2013-03-27: 0.5 mg via RESPIRATORY_TRACT
  Filled 2013-03-27: qty 2.5

## 2013-03-27 MED ORDER — PREDNISONE 50 MG PO TABS: 60.0000 mg | ORAL_TABLET | Freq: Once | ORAL | Status: DC

## 2013-03-27 MED ORDER — IPRATROPIUM BROMIDE 0.02 % IN SOLN
RESPIRATORY_TRACT | Status: AC
  Administered 2013-03-27: 0.5 mg
  Filled 2013-03-27: qty 2.5

## 2013-03-27 MED ORDER — PREDNISONE 20 MG PO TABS: 40.0000 mg | ORAL_TABLET | Freq: Every day | ORAL | Status: DC

## 2013-03-27 MED ORDER — PREDNISONE 50 MG PO TABS
60.0000 mg | ORAL_TABLET | Freq: Once | ORAL | Status: AC
  Administered 2013-03-27: 60 mg via ORAL
  Filled 2013-03-27: qty 1

## 2013-03-27 MED ORDER — ALBUTEROL SULFATE HFA 108 (90 BASE) MCG/ACT IN AERS: 2.0000 | INHALATION_SPRAY | RESPIRATORY_TRACT | Status: DC

## 2013-03-27 MED ORDER — ALBUTEROL SULFATE (5 MG/ML) 0.5% IN NEBU
INHALATION_SOLUTION | RESPIRATORY_TRACT | Status: AC
  Administered 2013-03-27: 5 mg
  Filled 2013-03-27: qty 0.5

## 2013-03-27 NOTE — ED Notes (Signed)
Pt left without discharge paperwork.
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
## 2013-03-27 NOTE — ED Notes (Addendum)
Pt c/o sob while taking out trash this am. Pt walked to triage, speaking complete sentences.

## 2013-04-02 ENCOUNTER — Encounter (HOSPITAL_COMMUNITY): Payer: Self-pay

## 2013-04-02 ENCOUNTER — Emergency Department (HOSPITAL_COMMUNITY)
Admission: EM | Admit: 2013-04-02 | Discharge: 2013-04-02 | Disposition: A | Discharge status: Home / Self Care | Payer: Self-pay | Attending: Emergency Medicine | Admitting: Emergency Medicine

## 2013-04-02 ENCOUNTER — Emergency Department (HOSPITAL_COMMUNITY): Payer: Self-pay

## 2013-04-02 ENCOUNTER — Other Ambulatory Visit: Payer: Self-pay

## 2013-04-02 DIAGNOSIS — J441 Chronic obstructive pulmonary disease with (acute) exacerbation: Secondary | ICD-10-CM | POA: Insufficient documentation

## 2013-04-02 DIAGNOSIS — R000 Tachycardia, unspecified: Secondary | ICD-10-CM | POA: Insufficient documentation

## 2013-04-02 DIAGNOSIS — Z79899 Other long term (current) drug therapy: Secondary | ICD-10-CM | POA: Insufficient documentation

## 2013-04-02 DIAGNOSIS — F17200 Nicotine dependence, unspecified, uncomplicated: Secondary | ICD-10-CM | POA: Insufficient documentation

## 2013-04-02 MED ORDER — ALBUTEROL SULFATE HFA 108 (90 BASE) MCG/ACT IN AERS: 1.0000 | INHALATION_SPRAY | Freq: Four times a day (QID) | RESPIRATORY_TRACT | Status: DC | PRN

## 2013-04-02 MED ORDER — METHYLPREDNISOLONE SODIUM SUCC 125 MG IJ SOLR
125.0000 mg | Freq: Once | INTRAMUSCULAR | Status: AC
  Administered 2013-04-02: 125 mg via INTRAVENOUS
  Filled 2013-04-02: qty 2

## 2013-04-02 MED ORDER — ALBUTEROL SULFATE (5 MG/ML) 0.5% IN NEBU
2.5000 mg | INHALATION_SOLUTION | Freq: Once | RESPIRATORY_TRACT | Status: AC
  Administered 2013-04-02: 2.5 mg via RESPIRATORY_TRACT
  Filled 2013-04-02: qty 0.5

## 2013-04-02 MED ORDER — ALBUTEROL SULFATE HFA 108 (90 BASE) MCG/ACT IN AERS
2.0000 | INHALATION_SPRAY | Freq: Once | RESPIRATORY_TRACT | Status: AC
  Administered 2013-04-02: 2 via RESPIRATORY_TRACT
  Filled 2013-04-02: qty 6.7

## 2013-04-02 MED ORDER — IPRATROPIUM BROMIDE 0.02 % IN SOLN
0.5000 mg | Freq: Once | RESPIRATORY_TRACT | Status: AC
  Administered 2013-04-02: 0.5 mg via RESPIRATORY_TRACT
  Filled 2013-04-02: qty 2.5

## 2013-04-02 MED ORDER — PREDNISONE 20 MG PO TABS: ORAL_TABLET | ORAL | Status: DC

## 2013-04-02 NOTE — ED Notes (Signed)
Respiratory therapy notified of inhaler.

## 2013-04-02 NOTE — ED Notes (Signed)
Pt c/o SOb since yesterday.  Reports has COPD and lost his inhaler yesterday.  Pt c/o SOB, audible wheezing.  Denies any pain or cough.
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
## 2013-04-02 NOTE — ED Notes (Signed)
Patient with no complaints at this time. Respirations even and unlabored. Skin warm/dry. Discharge instructions reviewed with patient at this time. Patient given opportunity to voice concerns/ask questions. IV removed per policy and band-aid applied to site. Patient discharged at this time and left Emergency Department with steady gait.  

## 2013-04-20 ENCOUNTER — Encounter (HOSPITAL_COMMUNITY): Payer: Self-pay | Admitting: *Deleted

## 2013-04-20 ENCOUNTER — Emergency Department (HOSPITAL_COMMUNITY)
Admission: EM | Admit: 2013-04-20 | Discharge: 2013-04-20 | Disposition: A | Discharge status: Home / Self Care | Payer: Self-pay | Attending: Emergency Medicine | Admitting: Emergency Medicine

## 2013-04-20 DIAGNOSIS — J449 Chronic obstructive pulmonary disease, unspecified: Secondary | ICD-10-CM | POA: Insufficient documentation

## 2013-04-20 DIAGNOSIS — F411 Generalized anxiety disorder: Secondary | ICD-10-CM | POA: Insufficient documentation

## 2013-04-20 DIAGNOSIS — Z87828 Personal history of other (healed) physical injury and trauma: Secondary | ICD-10-CM | POA: Insufficient documentation

## 2013-04-20 DIAGNOSIS — F17200 Nicotine dependence, unspecified, uncomplicated: Secondary | ICD-10-CM | POA: Insufficient documentation

## 2013-04-20 DIAGNOSIS — Z79899 Other long term (current) drug therapy: Secondary | ICD-10-CM | POA: Insufficient documentation

## 2013-04-20 DIAGNOSIS — F419 Anxiety disorder, unspecified: Secondary | ICD-10-CM

## 2013-04-20 DIAGNOSIS — R450 Nervousness: Secondary | ICD-10-CM | POA: Insufficient documentation

## 2013-04-20 DIAGNOSIS — J4489 Other specified chronic obstructive pulmonary disease: Secondary | ICD-10-CM | POA: Insufficient documentation

## 2013-04-20 MED ORDER — LORAZEPAM 1 MG PO TABS
1.0000 mg | ORAL_TABLET | Freq: Once | ORAL | Status: AC
  Administered 2013-04-20: 1 mg via ORAL
  Filled 2013-04-20: qty 1

## 2013-04-20 MED ORDER — LORAZEPAM 1 MG PO TABS: 1.0000 mg | ORAL_TABLET | Freq: Three times a day (TID) | ORAL | Status: DC | PRN

## 2013-04-20 NOTE — ED Notes (Signed)
Pt under a lot of stress, feels like he is having a "nervous breakdown", " i can't handle it"
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
## 2013-04-20 NOTE — ED Notes (Signed)
Pt left d/c instructions in room, contact number called and left message with girlfriend that his d/c papers are at nurses desk for pick up

## 2013-04-20 NOTE — ED Notes (Signed)
Pt denies SI of HI at this time

## 2013-05-01 ENCOUNTER — Emergency Department (HOSPITAL_COMMUNITY)
Admission: EM | Admit: 2013-05-01 | Discharge: 2013-05-01 | Disposition: A | Discharge status: Home / Self Care | Payer: Self-pay | Attending: Emergency Medicine | Admitting: Emergency Medicine

## 2013-05-01 ENCOUNTER — Encounter (HOSPITAL_COMMUNITY): Payer: Self-pay | Admitting: *Deleted

## 2013-05-01 DIAGNOSIS — Z5900 Homelessness unspecified: Secondary | ICD-10-CM | POA: Insufficient documentation

## 2013-05-01 DIAGNOSIS — Z760 Encounter for issue of repeat prescription: Secondary | ICD-10-CM | POA: Insufficient documentation

## 2013-05-01 DIAGNOSIS — R05 Cough: Secondary | ICD-10-CM | POA: Insufficient documentation

## 2013-05-01 DIAGNOSIS — Z79899 Other long term (current) drug therapy: Secondary | ICD-10-CM | POA: Insufficient documentation

## 2013-05-01 DIAGNOSIS — J441 Chronic obstructive pulmonary disease with (acute) exacerbation: Secondary | ICD-10-CM | POA: Insufficient documentation

## 2013-05-01 DIAGNOSIS — R059 Cough, unspecified: Secondary | ICD-10-CM | POA: Insufficient documentation

## 2013-05-01 DIAGNOSIS — F17200 Nicotine dependence, unspecified, uncomplicated: Secondary | ICD-10-CM | POA: Insufficient documentation

## 2013-05-01 DIAGNOSIS — R0789 Other chest pain: Secondary | ICD-10-CM | POA: Insufficient documentation

## 2013-05-01 MED ORDER — PREDNISONE 50 MG PO TABS
60.0000 mg | ORAL_TABLET | Freq: Once | ORAL | Status: AC
  Administered 2013-05-01: 60 mg via ORAL
  Filled 2013-05-01: qty 1

## 2013-05-01 MED ORDER — ALBUTEROL SULFATE HFA 108 (90 BASE) MCG/ACT IN AERS
2.0000 | INHALATION_SPRAY | Freq: Once | RESPIRATORY_TRACT | Status: AC
  Administered 2013-05-01: 2 via RESPIRATORY_TRACT
  Filled 2013-05-01: qty 6.7

## 2013-05-01 MED ORDER — PREDNISONE 10 MG PO TABS: ORAL_TABLET | ORAL | Status: DC

## 2013-05-01 NOTE — ED Notes (Signed)
Pt presents with SOB complaint and worsening productive cough with clear/white sputum. Pt denies pain, states" feels good except COPD and cough and lack of an inhaler. Pt reports cannot afford the inhaler. Pt received an albuterol treatment via EMS with positive results. NAD noted.

## 2013-05-01 NOTE — ED Notes (Signed)
Pt presents with c/o COPD, cough x 2 days. Received via EMS. Pt refused IV, given albuterol treatment.
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
## 2013-05-05 NOTE — ED Provider Notes (Signed)
Medical screening examination/treatment/procedure(s) were performed by non-physician practitioner and as supervising physician I was immediately available for consultation/collaboration.  Charles B. Sheldon, MD 05/05/13 1655 

## 2013-05-15 ENCOUNTER — Emergency Department (HOSPITAL_COMMUNITY)
Admission: EM | Admit: 2013-05-15 | Discharge: 2013-05-15 | Disposition: A | Discharge status: Home / Self Care | Payer: Self-pay | Attending: Emergency Medicine | Admitting: Emergency Medicine

## 2013-05-15 ENCOUNTER — Encounter (HOSPITAL_COMMUNITY): Payer: Self-pay | Admitting: Emergency Medicine

## 2013-05-15 DIAGNOSIS — F17200 Nicotine dependence, unspecified, uncomplicated: Secondary | ICD-10-CM | POA: Insufficient documentation

## 2013-05-15 DIAGNOSIS — R062 Wheezing: Secondary | ICD-10-CM | POA: Insufficient documentation

## 2013-05-15 DIAGNOSIS — R059 Cough, unspecified: Secondary | ICD-10-CM | POA: Insufficient documentation

## 2013-05-15 DIAGNOSIS — R05 Cough: Secondary | ICD-10-CM | POA: Insufficient documentation

## 2013-05-15 DIAGNOSIS — J441 Chronic obstructive pulmonary disease with (acute) exacerbation: Secondary | ICD-10-CM

## 2013-05-15 MED ORDER — DEXAMETHASONE SODIUM PHOSPHATE 4 MG/ML IJ SOLN
10.0000 mg | Freq: Once | INTRAMUSCULAR | Status: AC
  Administered 2013-05-15: 10 mg via INTRAMUSCULAR
  Filled 2013-05-15: qty 3

## 2013-05-15 MED ORDER — PREDNISONE 50 MG PO TABS: 50.0000 mg | ORAL_TABLET | Freq: Every day | ORAL | Status: DC

## 2013-05-15 MED ORDER — ALBUTEROL SULFATE HFA 108 (90 BASE) MCG/ACT IN AERS
1.0000 | INHALATION_SPRAY | Freq: Once | RESPIRATORY_TRACT | Status: AC
  Administered 2013-05-15: 2 via RESPIRATORY_TRACT
  Filled 2013-05-15: qty 6.7
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
## 2013-05-15 NOTE — Progress Notes (Signed)
MATCH program, qualified and copay over ride will be processed.

## 2013-05-15 NOTE — ED Notes (Signed)
Productive cough with white sputum x 2 days. Sob since last night when ran out of inhaler. Pt refused iv in route. Pt states all he wants is an inhaler and wants to speak with betty ratlif. Pt educated on [facility] also. Pt in nad. Fire Dpt states pt was 86%ra on their arrival. Pt received 2.5 albuterol in route. Pt 98-100% ra at this time. No sob noted. Pt refused xray.

## 2013-05-16 NOTE — Care Management Note (Signed)
    Page 1 of 1   05/15/2013     12:08:07 PM   CARE MANAGEMENT NOTE 05/15/2013  Patient:  Jerome Daniel, Jerome Daniel   Account Number:  [id number]  Date Initiated:  05/15/2013  Documentation initiated by:  Rosemary Holms  Subjective/Objective Assessment:   Pt presented in the ED. 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 Needs medication assistance     Action/Plan:   Anticipated DC Date:  05/15/2013   Anticipated DC Plan:  HOME/SELF CARE      DC Planning Services  CM consult  MATCH Program      Choice offered to / List presented to:             Status of service:  Completed, signed off Medicare Important Message given?   (If response is "NO", the following Medicare IM given date fields will be blank) Date Medicare IM given:   Date Additional Medicare IM given:    Discharge Disposition:    Per UR Regulation:    If discussed at Long Length of Stay Meetings, dates discussed:    Comments:  05/15/13 Rosemary Holms RN BSN CM Amber RN to call [facility] when medication is in the AVM and Arline Asp will have copay overridden.

## 2013-08-08 ENCOUNTER — Other Ambulatory Visit (HOSPITAL_COMMUNITY): Payer: Self-pay | Admitting: Physician Assistant

## 2013-08-08 DIAGNOSIS — R222 Localized swelling, mass and lump, trunk: Secondary | ICD-10-CM

## 2013-08-26 ENCOUNTER — Ambulatory Visit (HOSPITAL_COMMUNITY)
Admission: RE | Admit: 2013-08-26 | Discharge: 2013-08-26 | Disposition: A | Discharge status: Home / Self Care | Payer: Self-pay | Source: Ambulatory Visit | Attending: Physician Assistant | Admitting: Physician Assistant

## 2013-08-26 DIAGNOSIS — J438 Other emphysema: Secondary | ICD-10-CM | POA: Insufficient documentation

## 2013-08-26 DIAGNOSIS — J984 Other disorders of lung: Secondary | ICD-10-CM | POA: Insufficient documentation

## 2013-08-26 DIAGNOSIS — R911 Solitary pulmonary nodule: Secondary | ICD-10-CM | POA: Insufficient documentation

## 2013-08-26 DIAGNOSIS — R222 Localized swelling, mass and lump, trunk: Secondary | ICD-10-CM

## 2013-08-26 DIAGNOSIS — F17200 Nicotine dependence, unspecified, uncomplicated: Secondary | ICD-10-CM | POA: Insufficient documentation

## 2013-08-31 ENCOUNTER — Encounter (HOSPITAL_COMMUNITY): Payer: Self-pay | Admitting: Emergency Medicine

## 2013-08-31 ENCOUNTER — Emergency Department (HOSPITAL_COMMUNITY)
Admission: EM | Admit: 2013-08-31 | Discharge: 2013-08-31 | Disposition: A | Discharge status: Home / Self Care | Payer: Self-pay | Attending: Emergency Medicine | Admitting: Emergency Medicine

## 2013-08-31 DIAGNOSIS — F17200 Nicotine dependence, unspecified, uncomplicated: Secondary | ICD-10-CM | POA: Insufficient documentation

## 2013-08-31 DIAGNOSIS — K089 Disorder of teeth and supporting structures, unspecified: Secondary | ICD-10-CM | POA: Insufficient documentation

## 2013-08-31 DIAGNOSIS — IMO0002 Reserved for concepts with insufficient information to code with codable children: Secondary | ICD-10-CM | POA: Insufficient documentation

## 2013-08-31 DIAGNOSIS — J449 Chronic obstructive pulmonary disease, unspecified: Secondary | ICD-10-CM | POA: Insufficient documentation

## 2013-08-31 DIAGNOSIS — K0889 Other specified disorders of teeth and supporting structures: Secondary | ICD-10-CM

## 2013-08-31 DIAGNOSIS — J4489 Other specified chronic obstructive pulmonary disease: Secondary | ICD-10-CM | POA: Insufficient documentation

## 2013-08-31 DIAGNOSIS — Z79899 Other long term (current) drug therapy: Secondary | ICD-10-CM | POA: Insufficient documentation

## 2013-08-31 MED ORDER — PENICILLIN V POTASSIUM 500 MG PO TABS: 500.0000 mg | ORAL_TABLET | Freq: Four times a day (QID) | ORAL | Status: AC

## 2013-08-31 MED ORDER — OXYCODONE-ACETAMINOPHEN 5-325 MG PO TABS: 1.0000 | ORAL_TABLET | Freq: Four times a day (QID) | ORAL | Status: DC | PRN

## 2013-08-31 NOTE — ED Notes (Signed)
Patient with no complaints at this time. Respirations even and unlabored. Skin warm/dry. Discharge instructions reviewed with patient at this time. Patient given opportunity to voice concerns/ask questions. Patient discharged at this time and left Emergency Department with steady gait.   

## 2013-08-31 NOTE — ED Notes (Signed)
Pt came back to er stating that he was unable to afford the medications written for him, case manager and social worker for ed contacted, advised that pt had already used his allotment for assistance for this year and that there was not anything that the hospital would be able to do for him. Pt notified of results, expressed understanding, RN staff contacted different pharmacies for cheaper prices and advised pt of cheapest price found,

## 2013-08-31 NOTE — Progress Notes (Signed)
Clinical Child psychotherapist (CSW) received call from [employer] stating that patient does not have enough money to pay for his prescriptions at the pharmacy. CSW contacted Case Manager and made her aware of above. Case manager reported that she would call RN. Please reconsult if furthur social work needs arise. CSW signing off.   Jetta Lout, LCSWA Weekend CSW [phone]

## 2013-08-31 NOTE — ED Notes (Signed)
Pt c/o left lower toothache that started yesterday,
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
## 2013-09-08 ENCOUNTER — Emergency Department: Payer: Self-pay | Admitting: Emergency Medicine

## 2013-09-08 LAB — COMPREHENSIVE METABOLIC PANEL
Alkaline Phosphatase: 87 U/L
Anion Gap: 4 — ABNORMAL LOW (ref 7–16)
Bilirubin,Total: 0.5 mg/dL (ref 0.2–1.0)
Chloride: 102 mmol/L (ref 98–107)
Creatinine: 0.73 mg/dL (ref 0.60–1.30)
EGFR (African American): >60
EGFR (Non-African Amer.): >60
Glucose: 94 mg/dL (ref 65–99)
Osmolality: 268 (ref 275–301)
Potassium: 3.9 mmol/L (ref 3.5–5.1)
SGOT(AST): 22 U/L (ref 15–37)
Total Protein: 7.7 g/dL (ref 6.4–8.2)

## 2013-09-08 LAB — CBC
HGB: 16.1 g/dL (ref 13.0–18.0)
MCH: 33.6 pg (ref 26.0–34.0)
MCHC: 33.6 g/dL (ref 32.0–36.0)
MCV: 100 fL (ref 80–100)
Platelet: 339 10*3/uL (ref 150–440)
RBC: 4.79 10*6/uL (ref 4.40–5.90)
RDW: 13.3 % (ref 11.5–14.5)

## 2013-09-08 LAB — SALICYLATE LEVEL: Salicylates, Serum: 3.5 mg/dL — ABNORMAL HIGH

## 2013-09-24 ENCOUNTER — Encounter (HOSPITAL_COMMUNITY): Payer: Self-pay | Admitting: Emergency Medicine

## 2013-09-24 ENCOUNTER — Emergency Department (HOSPITAL_COMMUNITY)
Admission: EM | Admit: 2013-09-24 | Discharge: 2013-09-24 | Disposition: A | Discharge status: Home / Self Care | Payer: Self-pay | Attending: Emergency Medicine | Admitting: Emergency Medicine

## 2013-09-24 DIAGNOSIS — F17200 Nicotine dependence, unspecified, uncomplicated: Secondary | ICD-10-CM | POA: Insufficient documentation

## 2013-09-24 DIAGNOSIS — J441 Chronic obstructive pulmonary disease with (acute) exacerbation: Secondary | ICD-10-CM | POA: Insufficient documentation

## 2013-09-24 DIAGNOSIS — IMO0002 Reserved for concepts with insufficient information to code with codable children: Secondary | ICD-10-CM | POA: Insufficient documentation

## 2013-09-24 DIAGNOSIS — Z79899 Other long term (current) drug therapy: Secondary | ICD-10-CM | POA: Insufficient documentation

## 2013-09-24 MED ORDER — IPRATROPIUM BROMIDE 0.02 % IN SOLN
RESPIRATORY_TRACT | Status: AC
  Administered 2013-09-24: 0.5 mg via RESPIRATORY_TRACT
  Filled 2013-09-24: qty 2.5

## 2013-09-24 MED ORDER — ALBUTEROL SULFATE (5 MG/ML) 0.5% IN NEBU
INHALATION_SOLUTION | RESPIRATORY_TRACT | Status: AC
  Administered 2013-09-24: 2.5 mg via RESPIRATORY_TRACT
  Filled 2013-09-24: qty 0.5

## 2013-09-24 MED ORDER — IPRATROPIUM BROMIDE 0.02 % IN SOLN
0.5000 mg | Freq: Once | RESPIRATORY_TRACT | Status: AC
  Filled 2013-09-24: qty 2.5

## 2013-09-24 MED ORDER — ALBUTEROL SULFATE (5 MG/ML) 0.5% IN NEBU
2.5000 mg | INHALATION_SOLUTION | Freq: Once | RESPIRATORY_TRACT | Status: AC
  Filled 2013-09-24: qty 0.5

## 2013-09-24 MED ORDER — DEXAMETHASONE SODIUM PHOSPHATE 10 MG/ML IJ SOLN
10.0000 mg | Freq: Once | INTRAMUSCULAR | Status: AC
  Administered 2013-09-24: 10 mg via INTRAMUSCULAR
  Filled 2013-09-24: qty 1

## 2013-09-24 MED ORDER — ALBUTEROL SULFATE HFA 108 (90 BASE) MCG/ACT IN AERS
2.0000 | INHALATION_SPRAY | Freq: Once | RESPIRATORY_TRACT | Status: AC
  Administered 2013-09-24: 2 via RESPIRATORY_TRACT
  Filled 2013-09-24: qty 6.7
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
## 2013-09-24 NOTE — ED Provider Notes (Signed)
Medical screening examination/treatment/procedure(s) were performed by non-physician practitioner and as supervising physician I was immediately available for consultation/collaboration.  EKG Interpretation   None         Gilda Crease, MD 09/24/13 [phone]

## 2013-09-24 NOTE — ED Notes (Signed)
Ran out of inhaler today. Says he usually uses it several times a day and "I can't breathe without it"  Says he cannot afford to get an inhaler "they usually give me one here"

## 2013-10-06 ENCOUNTER — Encounter (HOSPITAL_COMMUNITY): Payer: Self-pay | Admitting: Emergency Medicine

## 2013-10-06 ENCOUNTER — Emergency Department (HOSPITAL_COMMUNITY): Payer: Self-pay

## 2013-10-06 ENCOUNTER — Emergency Department (HOSPITAL_COMMUNITY)
Admission: EM | Admit: 2013-10-06 | Discharge: 2013-10-06 | Disposition: A | Discharge status: Home / Self Care | Payer: Self-pay | Attending: Emergency Medicine | Admitting: Emergency Medicine

## 2013-10-06 DIAGNOSIS — J441 Chronic obstructive pulmonary disease with (acute) exacerbation: Secondary | ICD-10-CM | POA: Insufficient documentation

## 2013-10-06 DIAGNOSIS — Z79899 Other long term (current) drug therapy: Secondary | ICD-10-CM | POA: Insufficient documentation

## 2013-10-06 DIAGNOSIS — F17200 Nicotine dependence, unspecified, uncomplicated: Secondary | ICD-10-CM | POA: Insufficient documentation

## 2013-10-06 MED ORDER — PREDNISONE 20 MG PO TABS: ORAL_TABLET | ORAL | Status: DC

## 2013-10-06 MED ORDER — ALBUTEROL SULFATE (5 MG/ML) 0.5% IN NEBU
5.0000 mg | INHALATION_SOLUTION | Freq: Once | RESPIRATORY_TRACT | Status: AC
  Administered 2013-10-06: 5 mg via RESPIRATORY_TRACT
  Filled 2013-10-06: qty 1

## 2013-10-06 MED ORDER — PREDNISONE 50 MG PO TABS
60.0000 mg | ORAL_TABLET | Freq: Once | ORAL | Status: AC
  Administered 2013-10-06: 17:00:00 60 mg via ORAL
  Filled 2013-10-06 (×2): qty 1

## 2013-10-06 MED ORDER — ALBUTEROL SULFATE HFA 108 (90 BASE) MCG/ACT IN AERS
1.0000 | INHALATION_SPRAY | RESPIRATORY_TRACT | Status: DC | PRN
  Administered 2013-10-06: 2 via RESPIRATORY_TRACT
  Filled 2013-10-06: qty 6.7

## 2013-10-06 MED ORDER — TIOTROPIUM BROMIDE MONOHYDRATE 18 MCG IN CAPS
ORAL_CAPSULE | RESPIRATORY_TRACT | Status: AC
  Filled 2013-10-06: qty 5

## 2013-10-06 MED ORDER — TIOTROPIUM BROMIDE MONOHYDRATE 18 MCG IN CAPS
18.0000 ug | ORAL_CAPSULE | Freq: Every day | RESPIRATORY_TRACT | Status: DC
  Administered 2013-10-06: 18 ug via RESPIRATORY_TRACT
  Filled 2013-10-06: qty 5

## 2013-10-06 MED ORDER — IPRATROPIUM BROMIDE 0.02 % IN SOLN
0.5000 mg | Freq: Once | RESPIRATORY_TRACT | Status: AC
  Administered 2013-10-06: 0.5 mg via RESPIRATORY_TRACT
  Filled 2013-10-06: qty 2.5

## 2013-10-06 NOTE — ED Notes (Signed)
Pt c/o sob today. States he has COPD and he is out of his inhaler.
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
## 2013-10-19 ENCOUNTER — Encounter (HOSPITAL_COMMUNITY): Payer: Self-pay | Admitting: Emergency Medicine

## 2013-10-19 ENCOUNTER — Emergency Department (HOSPITAL_COMMUNITY)
Admission: EM | Admit: 2013-10-19 | Discharge: 2013-10-19 | Disposition: A | Discharge status: Home / Self Care | Payer: Self-pay | Attending: Emergency Medicine | Admitting: Emergency Medicine

## 2013-10-19 DIAGNOSIS — F17200 Nicotine dependence, unspecified, uncomplicated: Secondary | ICD-10-CM | POA: Insufficient documentation

## 2013-10-19 DIAGNOSIS — IMO0002 Reserved for concepts with insufficient information to code with codable children: Secondary | ICD-10-CM | POA: Insufficient documentation

## 2013-10-19 DIAGNOSIS — L03211 Cellulitis of face: Principal | ICD-10-CM | POA: Insufficient documentation

## 2013-10-19 DIAGNOSIS — J449 Chronic obstructive pulmonary disease, unspecified: Secondary | ICD-10-CM | POA: Insufficient documentation

## 2013-10-19 DIAGNOSIS — L0291 Cutaneous abscess, unspecified: Secondary | ICD-10-CM

## 2013-10-19 DIAGNOSIS — J4489 Other specified chronic obstructive pulmonary disease: Secondary | ICD-10-CM | POA: Insufficient documentation

## 2013-10-19 DIAGNOSIS — L0201 Cutaneous abscess of face: Secondary | ICD-10-CM | POA: Insufficient documentation

## 2013-10-19 DIAGNOSIS — Z792 Long term (current) use of antibiotics: Secondary | ICD-10-CM | POA: Insufficient documentation

## 2013-10-19 DIAGNOSIS — Z79899 Other long term (current) drug therapy: Secondary | ICD-10-CM | POA: Insufficient documentation

## 2013-10-19 DIAGNOSIS — K089 Disorder of teeth and supporting structures, unspecified: Secondary | ICD-10-CM | POA: Insufficient documentation

## 2013-10-19 MED ORDER — CLINDAMYCIN HCL 150 MG PO CAPS
300.0000 mg | ORAL_CAPSULE | Freq: Once | ORAL | Status: AC
  Administered 2013-10-19: 300 mg via ORAL
  Filled 2013-10-19: qty 2

## 2013-10-19 MED ORDER — CEPHALEXIN 500 MG PO CAPS: 500.0000 mg | ORAL_CAPSULE | Freq: Four times a day (QID) | ORAL | Status: DC

## 2013-10-19 MED ORDER — OXYCODONE-ACETAMINOPHEN 5-325 MG PO TABS: 2.0000 | ORAL_TABLET | ORAL | Status: DC | PRN

## 2013-10-19 MED ORDER — OXYCODONE-ACETAMINOPHEN 5-325 MG PO TABS
2.0000 | ORAL_TABLET | Freq: Once | ORAL | Status: AC
  Administered 2013-10-19: 2 via ORAL

## 2013-10-19 MED ORDER — LIDOCAINE HCL (PF) 1 % IJ SOLN
5.0000 mL | Freq: Once | INTRAMUSCULAR | Status: AC
  Administered 2013-10-19: 5 mL

## 2013-10-19 MED ORDER — OXYCODONE-ACETAMINOPHEN 5-325 MG PO TABS
ORAL_TABLET | ORAL | Status: AC
  Administered 2013-10-19: 2 via ORAL
  Filled 2013-10-19: qty 2

## 2013-10-19 MED ORDER — LIDOCAINE HCL (PF) 1 % IJ SOLN
INTRAMUSCULAR | Status: AC
  Administered 2013-10-19: 5 mL
  Filled 2013-10-19: qty 5

## 2013-10-19 NOTE — ED Notes (Signed)
Pt reports onset of dental pain 2 days ago. Now has L sided facial edema and abscess to L maxillary region.

## 2013-10-19 NOTE — ED Provider Notes (Signed)
Medical screening examination/treatment/procedure(s) were performed by non-physician practitioner and as supervising physician I was immediately available for consultation/collaboration.  Lumen Brinlee L Desa Rech, MD 10/19/13 1545 

## 2013-10-19 NOTE — ED Notes (Signed)
Pt c/o dental pain to left lower jaw area that started two days ago with swelling noted to left jaw and ?abcess noted under left jaw area, denies any drainage,
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
## 2013-10-19 NOTE — Discharge Instructions (Signed)
Abscess An abscess is an infected area that contains a collection of pus and debris.It can occur in almost any part of the body. An abscess is also known as a furuncle or boil. CAUSES  An abscess occurs when tissue gets infected. This can occur from blockage of oil or sweat glands, infection of hair follicles, or a minor injury to the skin. As the body tries to fight the infection, pus collects in the area and creates pressure under the skin. This pressure causes pain. People with weakened immune systems have difficulty fighting infections and get certain abscesses more often.  SYMPTOMS Usually an abscess develops on the skin and becomes a painful mass that is red, warm, and tender. If the abscess forms under the skin, you may feel a moveable soft area under the skin. Some abscesses break open (rupture) on their own, but most will continue to get worse without care. The infection can spread deeper into the body and eventually into the bloodstream, causing you to feel ill.  DIAGNOSIS  Your caregiver will take your medical history and perform a physical exam. A sample of fluid may also be taken from the abscess to determine what is causing your infection. TREATMENT  Your caregiver may prescribe antibiotic medicines to fight the infection. However, taking antibiotics alone usually does not cure an abscess. Your caregiver may need to make a small cut (incision) in the abscess to drain the pus. In some cases, gauze is packed into the abscess to reduce pain and to continue draining the area. HOME CARE INSTRUCTIONS   Only take over-the-counter or prescription medicines for pain, discomfort, or fever as directed by your caregiver.  If you were prescribed antibiotics, take them as directed. Finish them even if you start to feel better.  If gauze is used, follow your caregiver's directions for changing the gauze.  To avoid spreading the infection:  Keep your draining abscess covered with a  bandage.  Wash your hands well.  Do not share personal care items, towels, or whirlpools with others.  Avoid skin contact with others.  Keep your skin and clothes clean around the abscess.  Keep all follow-up appointments as directed by your caregiver. SEEK MEDICAL CARE IF:   You have increased pain, swelling, redness, fluid drainage, or bleeding.  You have muscle aches, chills, or a general ill feeling.  You have a fever. MAKE SURE YOU:   Understand these instructions.  Will watch your condition.  Will get help right away if you are not doing well or get worse. Document Released: 07/13/2005 Document Revised: 04/03/2012 Document Reviewed: 12/16/2011 [employer] Patient Information 2014 [employer].  

## 2013-10-30 ENCOUNTER — Emergency Department (HOSPITAL_COMMUNITY)
Admission: EM | Admit: 2013-10-30 | Discharge: 2013-10-30 | Disposition: A | Discharge status: Home / Self Care | Payer: Self-pay | Attending: Emergency Medicine | Admitting: Emergency Medicine

## 2013-10-30 ENCOUNTER — Encounter (HOSPITAL_COMMUNITY): Payer: Self-pay | Admitting: Emergency Medicine

## 2013-10-30 DIAGNOSIS — G8929 Other chronic pain: Secondary | ICD-10-CM | POA: Insufficient documentation

## 2013-10-30 DIAGNOSIS — Z79899 Other long term (current) drug therapy: Secondary | ICD-10-CM | POA: Insufficient documentation

## 2013-10-30 DIAGNOSIS — K089 Disorder of teeth and supporting structures, unspecified: Secondary | ICD-10-CM | POA: Insufficient documentation

## 2013-10-30 DIAGNOSIS — K0889 Other specified disorders of teeth and supporting structures: Secondary | ICD-10-CM

## 2013-10-30 DIAGNOSIS — Z792 Long term (current) use of antibiotics: Secondary | ICD-10-CM | POA: Insufficient documentation

## 2013-10-30 DIAGNOSIS — IMO0002 Reserved for concepts with insufficient information to code with codable children: Secondary | ICD-10-CM | POA: Insufficient documentation

## 2013-10-30 DIAGNOSIS — F17200 Nicotine dependence, unspecified, uncomplicated: Secondary | ICD-10-CM | POA: Insufficient documentation

## 2013-10-30 DIAGNOSIS — J441 Chronic obstructive pulmonary disease with (acute) exacerbation: Secondary | ICD-10-CM | POA: Insufficient documentation

## 2013-10-30 HISTORY — DX: Other chronic pain: G89.29

## 2013-10-30 HISTORY — DX: Disorder of teeth and supporting structures, unspecified: K08.9

## 2013-10-30 MED ORDER — TRAMADOL HCL 50 MG PO TABS
50.0000 mg | ORAL_TABLET | Freq: Once | ORAL | Status: AC
  Administered 2013-10-30: 50 mg via ORAL
  Filled 2013-10-30: qty 1

## 2013-10-30 MED ORDER — PENICILLIN V POTASSIUM 250 MG PO TABS
500.0000 mg | ORAL_TABLET | Freq: Once | ORAL | Status: AC
Start: 2013-10-30 — End: 2013-10-30
  Administered 2013-10-30: 500 mg via ORAL
  Filled 2013-10-30: qty 2

## 2013-10-30 MED ORDER — IBUPROFEN 800 MG PO TABS: 800.0000 mg | ORAL_TABLET | Freq: Three times a day (TID) | ORAL | Status: DC

## 2013-10-30 MED ORDER — AMOXICILLIN 500 MG PO CAPS: 500.0000 mg | ORAL_CAPSULE | Freq: Three times a day (TID) | ORAL | Status: DC

## 2013-10-30 MED ORDER — TRAMADOL HCL 50 MG PO TABS: 50.0000 mg | ORAL_TABLET | Freq: Four times a day (QID) | ORAL | Status: DC | PRN

## 2013-10-30 MED ORDER — IBUPROFEN 800 MG PO TABS
800.0000 mg | ORAL_TABLET | Freq: Once | ORAL | Status: AC
  Administered 2013-10-30: 800 mg via ORAL
  Filled 2013-10-30: qty 1
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
## 2013-10-30 NOTE — ED Notes (Signed)
Pt c/o left lower dental pain. No obvious swelling noted. Pt asked twice alerady for something for pain. Advised will have to wait for dr. Godfrey Pick

## 2013-10-30 NOTE — Discharge Instructions (Signed)
Your vital signs are well within normal limits at this time. No dental emergency found on today's examination. It is important that you see a dentist for correction of this problem as sone as possible. Please use medications as prescribed. Ultram may cause drowsiness, please use with caution. Dental Pain Toothache is pain in or around a tooth. It may get worse with chewing or with cold or heat.  HOME CARE  Your dentist may use a numbing medicine during treatment. If so, you may need to avoid eating until the medicine wears off. Ask your dentist about this.  Only take medicine as told by your dentist or doctor.  Avoid chewing food near the painful tooth until after all treatment is done. Ask your dentist about this. GET HELP RIGHT AWAY IF:   The problem gets worse or new problems appear.  You have a fever.  There is redness and puffiness (swelling) of the face, jaw, or neck.  You cannot open your mouth.  There is pain in the jaw.  There is very bad pain that is not helped by medicine. MAKE SURE YOU:   Understand these instructions.  Will watch your condition.  Will get help right away if you are not doing well or get worse. Document Released: 03/21/2008 Document Revised: 12/26/2011 Document Reviewed: 03/21/2008 [facility] Patient Information 2014 [place], [place].

## 2013-11-02 NOTE — ED Provider Notes (Signed)
Medical screening examination/treatment/procedure(s) were performed by non-physician practitioner and as supervising physician I was immediately available for consultation/collaboration.  EKG Interpretation   None        Nat Christen, MD 11/02/13 1429

## 2013-11-12 ENCOUNTER — Emergency Department (HOSPITAL_COMMUNITY)
Admission: EM | Admit: 2013-11-12 | Discharge: 2013-11-12 | Disposition: A | Discharge status: Home / Self Care | Payer: Self-pay | Attending: Emergency Medicine | Admitting: Emergency Medicine

## 2013-11-12 ENCOUNTER — Encounter (HOSPITAL_COMMUNITY): Payer: Self-pay | Admitting: Emergency Medicine

## 2013-11-12 DIAGNOSIS — Z791 Long term (current) use of non-steroidal anti-inflammatories (NSAID): Secondary | ICD-10-CM | POA: Insufficient documentation

## 2013-11-12 DIAGNOSIS — J4489 Other specified chronic obstructive pulmonary disease: Secondary | ICD-10-CM | POA: Insufficient documentation

## 2013-11-12 DIAGNOSIS — Z792 Long term (current) use of antibiotics: Secondary | ICD-10-CM | POA: Insufficient documentation

## 2013-11-12 DIAGNOSIS — G8929 Other chronic pain: Secondary | ICD-10-CM | POA: Insufficient documentation

## 2013-11-12 DIAGNOSIS — J449 Chronic obstructive pulmonary disease, unspecified: Secondary | ICD-10-CM | POA: Insufficient documentation

## 2013-11-12 DIAGNOSIS — IMO0002 Reserved for concepts with insufficient information to code with codable children: Secondary | ICD-10-CM | POA: Insufficient documentation

## 2013-11-12 DIAGNOSIS — F17200 Nicotine dependence, unspecified, uncomplicated: Secondary | ICD-10-CM | POA: Insufficient documentation

## 2013-11-12 DIAGNOSIS — K089 Disorder of teeth and supporting structures, unspecified: Secondary | ICD-10-CM | POA: Insufficient documentation

## 2013-11-12 HISTORY — DX: Other psychoactive substance abuse, uncomplicated: F19.10

## 2013-11-12 MED ORDER — PENICILLIN V POTASSIUM 250 MG PO TABS: 250.0000 mg | ORAL_TABLET | Freq: Four times a day (QID) | ORAL | Status: DC

## 2013-11-12 MED ORDER — NAPROXEN 250 MG PO TABS: 250.0000 mg | ORAL_TABLET | Freq: Two times a day (BID) | ORAL | Status: DC
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
## 2013-11-12 NOTE — ED Notes (Signed)
Pt asking for pain medication, advised pt that the edp would have to see him first,

## 2013-11-12 NOTE — ED Notes (Signed)
Went into room to discharge pt, pt upset with not being given narcotics while in er, explained to pt that he had two prescriptions. Pt continues to ask for narcotic prior to leaving the er, explained to pt that he would be able to have the prescriptions filled, pt upset states "I don't know why I can't get anything else", explained to pt that he had been to er several times in the past few weeks for the same tooth and that he would need to follow up with a dentist, I circled several areas of dental clinics and their phone numbers on the paperwork for pt to follow up.

## 2013-11-12 NOTE — ED Notes (Addendum)
Pt c/o left lower dental pain that started last night, has been seen in er several times recently for same dental pain, states " I don't have the money to have the tooth pulled" pt asking for pain medication upon arrival to exam room, explained to pt that the edp would have to see him first,

## 2013-11-12 NOTE — Discharge Instructions (Signed)
°Emergency Department Resource Guide °1) Find a Doctor and Pay Out of Pocket °Although you won't have to find out who is covered by your insurance plan, it is a good idea to ask around and get recommendations. You will then need to call the office and see if the doctor you have chosen will accept you as a new patient and what types of options they offer for patients who are self-pay. Some doctors offer discounts or will set up payment plans for their patients who do not have insurance, but you will need to ask so you aren't surprised when you get to your appointment. ° °2) Contact Your Local Health Department °Not all health departments have doctors that can see patients for sick visits, but many do, so it is worth a call to see if yours does. If you don't know where your local health department is, you can check in your phone book. The CDC also has a tool to help you locate your state's health department, and many state websites also have listings of all of their local health departments. ° °3) Find a Walk-in Clinic °If your illness is not likely to be very severe or complicated, you may want to try a walk in clinic. These are popping up all over the country in pharmacies, drugstores, and shopping centers. They're usually staffed by nurse practitioners or physician assistants that have been trained to treat common illnesses and complaints. They're usually fairly quick and inexpensive. However, if you have serious medical issues or chronic medical problems, these are probably not your best option. ° °No Primary Care Doctor: °- Call [facility] at  832-8000 - they can help you locate a primary care doctor that  accepts your insurance, provides certain services, etc. °- Physician Referral Service- 1-800-533-3463 ° °Chronic Pain Problems: °Organization         Address  Phone   Notes  °[facility]  (336) 297-2271 Patients need to be referred by their primary care doctor.  ° °Medication  Assistance: °Organization         Address  Phone   Notes  °[place] [facility] [address] °[place], [place] [address] (336) 641-8030 --Must be a resident of [place] °-- Must have NO insurance coverage whatsoever (no Medicaid/ Medicare, etc.) °-- The pt. MUST have a primary care doctor that directs their care regularly and follows them in the community °  °[facility]  (866) 331-1348   °[employer]  (888) 892-1162   ° °Agencies that provide inexpensive medical care: °Organization         Address  Phone   Notes  °[facility]  (336) 832-8035   °[facility]    (336) 832-7272   °[facility] [address] °[place], [place] [address] (336) 832-4777   °[facility] [address], °[place] (336) 271-4999   °[facility]    (336) 373-0678   °[facility]    (336) 272-1050   °[facility] ° [address], [place] Phone:  (336) 832-4444, Fax:  (336) 832-4440 Hours of Operation:  9 am - 6 pm, M-F.  Also accepts Medicaid/Medicare and self-pay.  °[facility] ° [address], [place] Phone: (336) 832-3150, Fax: (336) 832-3151. Hours of Operation:  8:30 am - 5:30 pm, M-F.  Also accepts Medicaid and self-pay.  °[facility] [place] [address], [place] Phone: (336) 878-6027   °[facility] [address], [place], [place] (336)723-1848, Ext. 123 Mondays & Thursdays: 7-9 AM.  First 15 patients are seen on a first come, first serve basis. °  ° °Medicaid-accepting [place] Providers: ° °Organization         Address  Phone   Notes  °[facility] [address], [place] (336) 641-2100 Also accepts self-pay patients.  °[facility] [address], [place] ° (336) 856-9996   °[facility] [address], [place]  (336) 288-8857   °[facility] [address] [place] [address], [place] (336) 299-7000   °Veita Bland [address], [place]  ° (336) 373-1557 Only accepts [place] Access Medicaid patients after they have their name applied to their card.  ° °Self-Pay (no insurance) in [place]: ° °Organization         Address  Phone   Notes  °Sickle Cell Patients, [facility] [address], [place] (336) 832-1970   °[facility] [address], [place] (336) 832-4400   °[facility] [place] ° [address], [place] (336) 992-4800   °[facility]/Dr. Osei-Bonsu ° [address] [place] [address], [place] or [address], [place] (336) 841-8500 Phone number for both [place] and [place] locations is the same.  °[facility] [address], [place] (336) 299-0000   °[facility] [address] [place] [address], [place] or [address] (336) 852-7530 °(336) 878-2260   °[facility] [address], [place] (336) 350-1642, phone; (336) 294-5005, fax Sees patients 1st and 3rd Saturday of every month.  Must not qualify for public or private insurance (i.e. Medicaid, Medicare, [place] Health Choice, Veterans' Benefits) • Household income should be no more than 200% of the poverty level •The clinic cannot treat you if you are pregnant or think you are pregnant • Sexually transmitted diseases are not treated at the clinic.  ° ° °Dental Care: °Organization         Address  Phone  Notes  °[place] [facility] [address], [place] (336) 641-6152 Accepts children up to age 21 who are enrolled in Medicaid or [place] Health Choice; pregnant women with a Medicaid card; and children who have applied for Medicaid or [place] Health Choice, but were declined, whose parents can pay a reduced fee at time of service.  °[place]  [facility] [place]  [address], [place] (336) 641-7733 Accepts children up to age 21 who are enrolled in Medicaid or [place] Health Choice; pregnant women with a Medicaid card; and children who have applied for Medicaid or [place] Health Choice, but were declined, whose parents can pay a reduced fee at time of service.  °[facility] ° [address],  (336) 641-4533 Patients are seen by appointment only. Walk-ins are not accepted. [facility] will see patients 18 years of age and older. °Monday - Tuesday (8am-5pm) °Most Wednesdays (8:30-5pm) °$30 per visit, cash only  °[facility] ° [address], [place] (336) 641-4533 Patients are seen by appointment only. Walk-ins are not accepted. [facility] will see patients 18 years of age and older. °One   Wednesday Evening (Monthly: Volunteer Based).  $30 per visit, cash only  °[facility]  (919) 537-3737 for adults; Children under age 4, call Graduate Pediatric Dentistry at ([phone]) [phone]. Children aged 4-14, please call (919) [phone] to request a pediatric application. ° Dental services are provided in all areas of dental care including fillings, crowns and bridges, complete and partial dentures, implants, gum treatment, root canals, and extractions. Preventive care is also provided. Treatment is provided to both adults and children. °Patients are selected via a lottery and there is often a waiting list. °  °[facility] [address], °[place] ° (336) 763-8833 [email or web address].drcivils.[email or web address] °  °[facility] [address], [place], [place] (336)723-1848, Ext. 123 Second and Fourth Thursday of each month, opens at 6:30 AM; Clinic ends at 9 AM.  Patients are seen on a first-come first-served basis, and a limited number are seen during each clinic.  ° °[facility] ° [address], [place], [place] (336) 723-7904    Eligibility Requirements °You must have lived in [place], [place], or [place] counties for at least the last three months. °  You cannot be eligible for state or federal sponsored healthcare insurance, including Veterans Administration, Medicaid, or Medicare. °  You generally cannot be eligible for healthcare insurance through your employer.  °  How to apply: °Eligibility screenings are held every Tuesday and Wednesday afternoon from 1:00 pm until 4:00 pm. You do not need an appointment for the interview!  °[facility] [address], [place], [place] 336-631-2330   °[facility]  336-342-8273   °[facility]  336-703-3100   °[facility]  336-570-6415   ° °Behavioral Health Resources in the Community: °Intensive Outpatient Programs °Organization         Address  Phone  Notes  °[facility] [address], [place], [place] 336-878-6098   °[facility] [address], [place], [place] 336-832-9800   °[facility] [address], [place], [place] ° 336-882-2125   °[facility] [address],  °[place], [place] 1-800-853-5163 or 336-641-4981   °Substance Abuse Resources °Organization         Address  Phone  Notes  °[facility]  336-882-2125   °[facility]  336-784-9470   °[facility]  336-285-9073   °[facility]  336-845-3988   °Residential & Outpatient Substance Abuse Program  1-800-659-3381   °Psychological Services °Organization         Address  Phone  Notes  ° Health  336- 832-9600   °[facility]  336- 378-7881   °[facility] [address], [place] 1-800-853-5163 or 336-641-4981   ° °Mobile Crisis Teams °Organization         Address  Phone  Notes  °[facility], Mobile Crisis Care Unit  1-877-626-1772   °Assertive °[facility] ° [address]  [place], [place] 336-834-9664   °Sharon DeEsch [address] °[place] 336-554-5454   ° °Self-Help/Support Groups °Organization         Address  Phone             Notes  °[facility] [place] - variety of support groups  336- 373-1402 Call for more information  °Narcotics Anonymous (NA), [facility] [address], °[place]   2 meetings at this location  ° °  Residential Treatment Programs Organization         Address  Phone  Notes  [facility] [address]    [place]  1-626-876-9015   [facility]  [address], [place] [address], [place], [place]   [facility] [place], [place] (902) 666-9721 Admissions: 8am-3pm M-F  [facility] [place] [address],    [place], [place] 188-416-6063   [facility] [address] [place], [place], [place]   [facility] [address]  [place], [place]   [facility] Intensive Outpatient [address], [place], [place]   [facility] ([place]  [place], [place] 1-(947)028-5468 or [phone]   [facility] [address], [place], [place] Accepts Medicaid  [facility] [address],  [place] 1-203-065-3407 Substance Abuse/Addiction Treatment   [facility]         Address  Phone  Notes  [facility]  204-517-4502   Domenic Schwab, PhD [address] [place], [place]   (743) 537-5441 or 670-492-6190   [place] [address] [place], [place] [phone]   [facility] [address] [place], [place] (607)332-5431 Insurance/Medicaid/sponsorship through [facility] and Families [address] [place]                                    [place], [place] 319-370-1677 [place] [address], [place] ([phone]    Dr. Adele Schilder  [phone]   [facility] 1) [address], [place] 2) [place] 3)  [address], [place] ([phone] ([phone]) [phone]  [phone]   [place] [phone] or (786) [phone] (After Hours)       Take the prescriptions as directed.  Call your regular dentist today to schedule a follow up appointment within the week.  Return to the Emergency Department immediately sooner if worsening.

## 2013-11-18 ENCOUNTER — Emergency Department (HOSPITAL_COMMUNITY): Payer: Self-pay

## 2013-11-18 ENCOUNTER — Encounter (HOSPITAL_COMMUNITY): Payer: Self-pay | Admitting: Emergency Medicine

## 2013-11-18 ENCOUNTER — Emergency Department (HOSPITAL_COMMUNITY)
Admission: EM | Admit: 2013-11-18 | Discharge: 2013-11-18 | Discharge status: Against Medical Advice | Payer: Self-pay | Attending: Emergency Medicine | Admitting: Emergency Medicine

## 2013-11-18 DIAGNOSIS — Z79899 Other long term (current) drug therapy: Secondary | ICD-10-CM | POA: Insufficient documentation

## 2013-11-18 DIAGNOSIS — J159 Unspecified bacterial pneumonia: Secondary | ICD-10-CM | POA: Insufficient documentation

## 2013-11-18 DIAGNOSIS — J441 Chronic obstructive pulmonary disease with (acute) exacerbation: Secondary | ICD-10-CM | POA: Insufficient documentation

## 2013-11-18 DIAGNOSIS — Z791 Long term (current) use of non-steroidal anti-inflammatories (NSAID): Secondary | ICD-10-CM | POA: Insufficient documentation

## 2013-11-18 DIAGNOSIS — R0902 Hypoxemia: Secondary | ICD-10-CM | POA: Insufficient documentation

## 2013-11-18 DIAGNOSIS — G8929 Other chronic pain: Secondary | ICD-10-CM | POA: Insufficient documentation

## 2013-11-18 DIAGNOSIS — Z792 Long term (current) use of antibiotics: Secondary | ICD-10-CM | POA: Insufficient documentation

## 2013-11-18 DIAGNOSIS — R0789 Other chest pain: Secondary | ICD-10-CM | POA: Insufficient documentation

## 2013-11-18 DIAGNOSIS — J189 Pneumonia, unspecified organism: Secondary | ICD-10-CM

## 2013-11-18 DIAGNOSIS — F17200 Nicotine dependence, unspecified, uncomplicated: Secondary | ICD-10-CM | POA: Insufficient documentation

## 2013-11-18 LAB — URINALYSIS, ROUTINE W REFLEX MICROSCOPIC
Bilirubin Urine: NEGATIVE
Glucose, UA: NEGATIVE mg/dL
HGB URINE DIPSTICK: NEGATIVE
KETONES UR: NEGATIVE mg/dL
Leukocytes, UA: NEGATIVE
NITRITE: NEGATIVE
Specific Gravity, Urine: <1.005 — ABNORMAL LOW (ref 1.005–1.030)
UROBILINOGEN UA: 0.2 mg/dL (ref 0.0–1.0)
pH: 7 (ref 5.0–8.0)

## 2013-11-18 LAB — COMPREHENSIVE METABOLIC PANEL
ALT: 10 U/L (ref 0–53)
AST: 10 U/L (ref 0–37)
Albumin: 3.5 g/dL (ref 3.5–5.2)
Alkaline Phosphatase: 67 U/L (ref 39–117)
BUN: 14 mg/dL (ref 6–23)
CALCIUM: 9.3 mg/dL (ref 8.4–10.5)
CO2: 27 mEq/L (ref 19–32)
Chloride: 100 mEq/L (ref 96–112)
Creatinine, Ser: 0.73 mg/dL (ref 0.50–1.35)
GFR calc non Af Amer: >90 mL/min (ref >90)
GLUCOSE: 95 mg/dL (ref 70–99)
Potassium: 4.4 mEq/L (ref 3.7–5.3)
Sodium: 139 mEq/L (ref 137–147)
Total Bilirubin: 0.6 mg/dL (ref 0.3–1.2)
Total Protein: 8 g/dL (ref 6.0–8.3)

## 2013-11-18 LAB — LIPASE, BLOOD: LIPASE: 26 U/L (ref 11–59)

## 2013-11-18 LAB — CBC WITH DIFFERENTIAL/PLATELET
Basophils Absolute: 0 10*3/uL (ref 0.0–0.1)
Basophils Relative: 0 % (ref 0–1)
EOS ABS: 0.5 10*3/uL (ref 0.0–0.7)
Eosinophils Relative: 5 % (ref 0–5)
HCT: 47 % (ref 39.0–52.0)
HEMOGLOBIN: 15.6 g/dL (ref 13.0–17.0)
LYMPHS ABS: 1.1 10*3/uL (ref 0.7–4.0)
Lymphocytes Relative: 12 % (ref 12–46)
MCH: 32.4 pg (ref 26.0–34.0)
MCHC: 33.2 g/dL (ref 30.0–36.0)
MCV: 97.5 fL (ref 78.0–100.0)
MONOS PCT: 9 % (ref 3–12)
Monocytes Absolute: 0.8 10*3/uL (ref 0.1–1.0)
NEUTROS PCT: 74 % (ref 43–77)
Neutro Abs: 6.6 10*3/uL (ref 1.7–7.7)
PLATELETS: 282 10*3/uL (ref 150–400)
RBC: 4.82 MIL/uL (ref 4.22–5.81)
RDW: 12.5 % (ref 11.5–15.5)
WBC: 8.9 10*3/uL (ref 4.0–10.5)

## 2013-11-18 LAB — TROPONIN I

## 2013-11-18 LAB — URINE MICROSCOPIC-ADD ON

## 2013-11-18 LAB — D-DIMER, QUANTITATIVE (NOT AT ARMC): D DIMER QUANT: 0.48 ug{FEU}/mL (ref 0.00–0.48)

## 2013-11-18 MED ORDER — DEXTROSE 5 % IV SOLN: 500.0000 mg | Freq: Once | INTRAVENOUS | Status: DC

## 2013-11-18 MED ORDER — OXYCODONE-ACETAMINOPHEN 5-325 MG PO TABS
2.0000 | ORAL_TABLET | Freq: Once | ORAL | Status: AC
  Administered 2013-11-18: 2 via ORAL
  Filled 2013-11-18: qty 2

## 2013-11-18 MED ORDER — IOHEXOL 350 MG/ML SOLN
100.0000 mL | Freq: Once | INTRAVENOUS | Status: AC | PRN
  Administered 2013-11-18: 100 mL via INTRAVENOUS

## 2013-11-18 MED ORDER — LEVOFLOXACIN 750 MG PO TABS: 750.0000 mg | ORAL_TABLET | Freq: Every day | ORAL | Status: DC

## 2013-11-18 MED ORDER — LEVOFLOXACIN 750 MG PO TABS
750.0000 mg | ORAL_TABLET | Freq: Once | ORAL | Status: AC
  Administered 2013-11-18: 750 mg via ORAL
  Filled 2013-11-18 (×2): qty 1

## 2013-11-18 MED ORDER — DEXTROSE 5 % IV SOLN: 1.0000 g | Freq: Once | INTRAVENOUS | Status: DC

## 2013-11-18 MED ORDER — IBUPROFEN 800 MG PO TABS: 800.0000 mg | ORAL_TABLET | Freq: Three times a day (TID) | ORAL | Status: DC

## 2013-11-18 MED ORDER — IOHEXOL 350 MG/ML SOLN
80.0000 mL | Freq: Once | INTRAVENOUS | Status: AC | PRN
  Administered 2013-11-18: 80 mL via INTRAVENOUS
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
## 2013-11-18 NOTE — ED Notes (Signed)
Onset of left flank pain yesterday, but now states "it's in my left lung, i think i have pneumonia"

## 2013-11-18 NOTE — Discharge Instructions (Signed)
Pneumonia, Adult Admission was recommended but you decided to go home. Follow up with Dr. Luan Pulling this week. You need a repeat CT scan in 3 months to recheck your lungs. Return to the ED if you develop new or worsening symptoms. Pneumonia is an infection of the lungs.  CAUSES Pneumonia may be caused by bacteria or a virus. Usually, these infections are caused by breathing infectious particles into the lungs (respiratory tract). SYMPTOMS   Cough.  Fever.  Chest pain.  Increased rate of breathing.  Wheezing.  Mucus production. DIAGNOSIS  If you have the common symptoms of pneumonia, your caregiver will typically confirm the diagnosis with a chest X-ray. The X-ray will show an abnormality in the lung (pulmonary infiltrate) if you have pneumonia. Other tests of your blood, urine, or sputum may be done to find the specific cause of your pneumonia. Your caregiver may also do tests (blood gases or pulse oximetry) to see how well your lungs are working. TREATMENT  Some forms of pneumonia may be spread to other people when you cough or sneeze. You may be asked to wear a mask before and during your exam. Pneumonia that is caused by bacteria is treated with antibiotic medicine. Pneumonia that is caused by the influenza virus may be treated with an antiviral medicine. Most other viral infections must run their course. These infections will not respond to antibiotics.  PREVENTION A pneumococcal shot (vaccine) is available to prevent a common bacterial cause of pneumonia. This is usually suggested for:  People over 24 years old.  Patients on chemotherapy.  People with chronic lung problems, such as bronchitis or emphysema.  People with immune system problems. If you are over 65 or have a high risk condition, you may receive the pneumococcal vaccine if you have not received it before. In some countries, a routine influenza vaccine is also recommended. This vaccine can help prevent some cases of  pneumonia.You may be offered the influenza vaccine as part of your care. If you smoke, it is time to quit. You may receive instructions on how to stop smoking. Your caregiver can provide medicines and counseling to help you quit. HOME CARE INSTRUCTIONS   Cough suppressants may be used if you are losing too much rest. However, coughing protects you by clearing your lungs. You should avoid using cough suppressants if you can.  Your caregiver may have prescribed medicine if he or she thinks your pneumonia is caused by a bacteria or influenza. Finish your medicine even if you start to feel better.  Your caregiver may also prescribe an expectorant. This loosens the mucus to be coughed up.  Only take over-the-counter or prescription medicines for pain, discomfort, or fever as directed by your caregiver.  Do not smoke. Smoking is a common cause of bronchitis and can contribute to pneumonia. If you are a smoker and continue to smoke, your cough may last several weeks after your pneumonia has cleared.  A cold steam vaporizer or humidifier in your room or home may help loosen mucus.  Coughing is often worse at night. Sleeping in a semi-upright position in a recliner or using a couple pillows under your head will help with this.  Get rest as you feel it is needed. Your body will usually let you know when you need to rest. SEEK IMMEDIATE MEDICAL CARE IF:   Your illness becomes worse. This is especially true if you are elderly or weakened from any other disease.  You cannot control your cough with suppressants  and are losing sleep.  You begin coughing up blood.  You develop pain which is getting worse or is uncontrolled with medicines.  You have a fever.  Any of the symptoms which initially brought you in for treatment are getting worse rather than better.  You develop shortness of breath or chest pain. MAKE SURE YOU:   Understand these instructions.  Will watch your condition.  Will get  help right away if you are not doing well or get worse. Document Released: 10/03/2005 Document Revised: 12/26/2011 Document Reviewed: 12/23/2010 [facility] Patient Information 2014 [place], [place].

## 2013-11-18 NOTE — ED Notes (Signed)
Ambulated pt while monitoring O2. O2 stat began at 95 and heart rate at 85. Pt and I ambulated roughly 100 feet. Pt. Stated he felt ok and not short of breath. O2 stat dropped to 85% and heart rate increased to 105.

## 2013-12-26 ENCOUNTER — Emergency Department (HOSPITAL_COMMUNITY)
Admission: EM | Admit: 2013-12-26 | Discharge: 2013-12-26 | Disposition: A | Discharge status: Home / Self Care | Payer: Self-pay | Attending: Emergency Medicine | Admitting: Emergency Medicine

## 2013-12-26 ENCOUNTER — Emergency Department (HOSPITAL_COMMUNITY): Payer: Self-pay

## 2013-12-26 ENCOUNTER — Encounter (HOSPITAL_COMMUNITY): Payer: Self-pay | Admitting: Emergency Medicine

## 2013-12-26 DIAGNOSIS — Z8701 Personal history of pneumonia (recurrent): Secondary | ICD-10-CM | POA: Insufficient documentation

## 2013-12-26 DIAGNOSIS — Z8659 Personal history of other mental and behavioral disorders: Secondary | ICD-10-CM | POA: Insufficient documentation

## 2013-12-26 DIAGNOSIS — Z792 Long term (current) use of antibiotics: Secondary | ICD-10-CM | POA: Insufficient documentation

## 2013-12-26 DIAGNOSIS — J441 Chronic obstructive pulmonary disease with (acute) exacerbation: Secondary | ICD-10-CM | POA: Insufficient documentation

## 2013-12-26 DIAGNOSIS — Z79899 Other long term (current) drug therapy: Secondary | ICD-10-CM | POA: Insufficient documentation

## 2013-12-26 DIAGNOSIS — R0789 Other chest pain: Secondary | ICD-10-CM | POA: Insufficient documentation

## 2013-12-26 DIAGNOSIS — G8929 Other chronic pain: Secondary | ICD-10-CM | POA: Insufficient documentation

## 2013-12-26 DIAGNOSIS — F17200 Nicotine dependence, unspecified, uncomplicated: Secondary | ICD-10-CM | POA: Insufficient documentation

## 2013-12-26 DIAGNOSIS — R079 Chest pain, unspecified: Secondary | ICD-10-CM

## 2013-12-26 DIAGNOSIS — R059 Cough, unspecified: Secondary | ICD-10-CM | POA: Insufficient documentation

## 2013-12-26 DIAGNOSIS — Z791 Long term (current) use of non-steroidal anti-inflammatories (NSAID): Secondary | ICD-10-CM | POA: Insufficient documentation

## 2013-12-26 DIAGNOSIS — R05 Cough: Secondary | ICD-10-CM | POA: Insufficient documentation

## 2013-12-26 HISTORY — DX: Pneumonia, unspecified organism: J18.9

## 2013-12-26 LAB — BASIC METABOLIC PANEL
BUN: 11 mg/dL (ref 6–23)
CALCIUM: 9.3 mg/dL (ref 8.4–10.5)
CO2: 27 meq/L (ref 19–32)
CREATININE: 0.62 mg/dL (ref 0.50–1.35)
Chloride: 97 mEq/L (ref 96–112)
GFR calc Af Amer: >90 mL/min (ref >90)
GFR calc non Af Amer: >90 mL/min (ref >90)
GLUCOSE: 99 mg/dL (ref 70–99)
Potassium: 3.8 mEq/L (ref 3.7–5.3)
SODIUM: 137 meq/L (ref 137–147)

## 2013-12-26 LAB — CBC WITH DIFFERENTIAL/PLATELET
Basophils Absolute: 0 10*3/uL (ref 0.0–0.1)
Basophils Relative: 0 % (ref 0–1)
EOS PCT: 3 % (ref 0–5)
Eosinophils Absolute: 0.3 10*3/uL (ref 0.0–0.7)
HCT: 46.2 % (ref 39.0–52.0)
Hemoglobin: 15.4 g/dL (ref 13.0–17.0)
LYMPHS ABS: 1.2 10*3/uL (ref 0.7–4.0)
Lymphocytes Relative: 13 % (ref 12–46)
MCH: 31.4 pg (ref 26.0–34.0)
MCHC: 33.3 g/dL (ref 30.0–36.0)
MCV: 94.3 fL (ref 78.0–100.0)
MONO ABS: 0.7 10*3/uL (ref 0.1–1.0)
Monocytes Relative: 7 % (ref 3–12)
Neutro Abs: 7.4 10*3/uL (ref 1.7–7.7)
Neutrophils Relative %: 77 % (ref 43–77)
Platelets: 245 10*3/uL (ref 150–400)
RBC: 4.9 MIL/uL (ref 4.22–5.81)
RDW: 13.1 % (ref 11.5–15.5)
WBC: 9.6 10*3/uL (ref 4.0–10.5)

## 2013-12-26 LAB — TROPONIN I

## 2013-12-26 MED ORDER — ALBUTEROL SULFATE (2.5 MG/3ML) 0.083% IN NEBU
5.0000 mg | INHALATION_SOLUTION | Freq: Once | RESPIRATORY_TRACT | Status: DC
  Filled 2013-12-26: qty 6

## 2013-12-26 MED ORDER — HYDROMORPHONE HCL PF 1 MG/ML IJ SOLN
1.0000 mg | Freq: Once | INTRAMUSCULAR | Status: AC
  Administered 2013-12-26: 1 mg via INTRAMUSCULAR
  Filled 2013-12-26: qty 1

## 2013-12-26 MED ORDER — IPRATROPIUM-ALBUTEROL 0.5-2.5 (3) MG/3ML IN SOLN
3.0000 mL | Freq: Once | RESPIRATORY_TRACT | Status: AC
  Administered 2013-12-26: 3 mL via RESPIRATORY_TRACT
  Filled 2013-12-26: qty 3

## 2013-12-26 MED ORDER — ALBUTEROL SULFATE (2.5 MG/3ML) 0.083% IN NEBU
2.5000 mg | INHALATION_SOLUTION | Freq: Once | RESPIRATORY_TRACT | Status: AC
  Administered 2013-12-26: 2.5 mg via RESPIRATORY_TRACT

## 2013-12-26 MED ORDER — ALBUTEROL SULFATE (2.5 MG/3ML) 0.083% IN NEBU
INHALATION_SOLUTION | RESPIRATORY_TRACT | Status: AC
  Filled 2013-12-26: qty 3

## 2013-12-26 MED ORDER — HYDROCODONE-ACETAMINOPHEN 5-325 MG PO TABS: 1.0000 | ORAL_TABLET | Freq: Four times a day (QID) | ORAL | Status: DC | PRN

## 2013-12-26 NOTE — ED Notes (Signed)
Pt alert & oriented x4, stable gait. Patient given discharge instructions, paperwork & prescription(s). Patient  instructed to stop at the registration desk to finish any additional paperwork. Patient verbalized understanding. Pt left department w/ no further questions. 
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
## 2013-12-26 NOTE — Discharge Instructions (Signed)
Follow up with your md next week. °

## 2013-12-26 NOTE — ED Notes (Signed)
Patient complaining of right-sided chest pain radiating into right side. Reports started today. Recently admitted for pneumonia at [facility].

## 2014-06-01 ENCOUNTER — Emergency Department (HOSPITAL_COMMUNITY): Payer: Self-pay

## 2014-06-01 ENCOUNTER — Encounter (HOSPITAL_COMMUNITY): Payer: Self-pay | Admitting: Emergency Medicine

## 2014-06-01 ENCOUNTER — Emergency Department (HOSPITAL_COMMUNITY)
Admission: EM | Admit: 2014-06-01 | Discharge: 2014-06-01 | Disposition: A | Discharge status: Home / Self Care | Payer: Self-pay | Attending: Emergency Medicine | Admitting: Emergency Medicine

## 2014-06-01 DIAGNOSIS — IMO0002 Reserved for concepts with insufficient information to code with codable children: Secondary | ICD-10-CM | POA: Insufficient documentation

## 2014-06-01 DIAGNOSIS — J159 Unspecified bacterial pneumonia: Secondary | ICD-10-CM | POA: Insufficient documentation

## 2014-06-01 DIAGNOSIS — J189 Pneumonia, unspecified organism: Secondary | ICD-10-CM

## 2014-06-01 DIAGNOSIS — R0789 Other chest pain: Secondary | ICD-10-CM | POA: Insufficient documentation

## 2014-06-01 DIAGNOSIS — J449 Chronic obstructive pulmonary disease, unspecified: Secondary | ICD-10-CM | POA: Insufficient documentation

## 2014-06-01 DIAGNOSIS — R928 Other abnormal and inconclusive findings on diagnostic imaging of breast: Secondary | ICD-10-CM | POA: Insufficient documentation

## 2014-06-01 DIAGNOSIS — R0781 Pleurodynia: Secondary | ICD-10-CM

## 2014-06-01 DIAGNOSIS — R079 Chest pain, unspecified: Secondary | ICD-10-CM | POA: Insufficient documentation

## 2014-06-01 DIAGNOSIS — R918 Other nonspecific abnormal finding of lung field: Secondary | ICD-10-CM

## 2014-06-01 DIAGNOSIS — R222 Localized swelling, mass and lump, trunk: Secondary | ICD-10-CM | POA: Insufficient documentation

## 2014-06-01 DIAGNOSIS — R9389 Abnormal findings on diagnostic imaging of other specified body structures: Secondary | ICD-10-CM

## 2014-06-01 DIAGNOSIS — Z87891 Personal history of nicotine dependence: Secondary | ICD-10-CM | POA: Insufficient documentation

## 2014-06-01 DIAGNOSIS — J4489 Other specified chronic obstructive pulmonary disease: Secondary | ICD-10-CM | POA: Insufficient documentation

## 2014-06-01 DIAGNOSIS — Z792 Long term (current) use of antibiotics: Secondary | ICD-10-CM | POA: Insufficient documentation

## 2014-06-01 DIAGNOSIS — Z791 Long term (current) use of non-steroidal anti-inflammatories (NSAID): Secondary | ICD-10-CM | POA: Insufficient documentation

## 2014-06-01 HISTORY — DX: Pneumonia, unspecified organism: J18.9

## 2014-06-01 LAB — COMPREHENSIVE METABOLIC PANEL
ALT: 8 U/L (ref 0–53)
ANION GAP: 9 (ref 5–15)
AST: 12 U/L (ref 0–37)
Albumin: 3.6 g/dL (ref 3.5–5.2)
Alkaline Phosphatase: 55 U/L (ref 39–117)
BUN: 17 mg/dL (ref 6–23)
CALCIUM: 9.1 mg/dL (ref 8.4–10.5)
CO2: 27 mEq/L (ref 19–32)
Chloride: 101 mEq/L (ref 96–112)
Creatinine, Ser: 0.63 mg/dL (ref 0.50–1.35)
GFR calc non Af Amer: >90 mL/min (ref >90)
GLUCOSE: 96 mg/dL (ref 70–99)
Potassium: 4.5 mEq/L (ref 3.7–5.3)
SODIUM: 137 meq/L (ref 137–147)
Total Bilirubin: 0.3 mg/dL (ref 0.3–1.2)
Total Protein: 7.8 g/dL (ref 6.0–8.3)

## 2014-06-01 LAB — CBC WITH DIFFERENTIAL/PLATELET
Basophils Absolute: 0 10*3/uL (ref 0.0–0.1)
Basophils Relative: 1 % (ref 0–1)
EOS ABS: 0.5 10*3/uL (ref 0.0–0.7)
EOS PCT: 6 % — AB (ref 0–5)
HCT: 45.1 % (ref 39.0–52.0)
Hemoglobin: 14.7 g/dL (ref 13.0–17.0)
Lymphocytes Relative: 23 % (ref 12–46)
Lymphs Abs: 1.9 10*3/uL (ref 0.7–4.0)
MCH: 30.1 pg (ref 26.0–34.0)
MCHC: 32.6 g/dL (ref 30.0–36.0)
MCV: 92.2 fL (ref 78.0–100.0)
MONOS PCT: 7 % (ref 3–12)
Monocytes Absolute: 0.6 10*3/uL (ref 0.1–1.0)
Neutro Abs: 5.2 10*3/uL (ref 1.7–7.7)
Neutrophils Relative %: 63 % (ref 43–77)
PLATELETS: 228 10*3/uL (ref 150–400)
RBC: 4.89 MIL/uL (ref 4.22–5.81)
RDW: 14.7 % (ref 11.5–15.5)
WBC: 8.2 10*3/uL (ref 4.0–10.5)

## 2014-06-01 LAB — TROPONIN I: Troponin I: <0.3 ng/mL (ref <0.30)

## 2014-06-01 MED ORDER — FENTANYL CITRATE 0.05 MG/ML IJ SOLN
50.0000 ug | Freq: Once | INTRAMUSCULAR | Status: AC
  Administered 2014-06-01: 50 ug via INTRAVENOUS
  Filled 2014-06-01: qty 2

## 2014-06-01 MED ORDER — KETOROLAC TROMETHAMINE 30 MG/ML IJ SOLN
30.0000 mg | Freq: Once | INTRAMUSCULAR | Status: AC
  Administered 2014-06-01: 30 mg via INTRAVENOUS
  Filled 2014-06-01: qty 1

## 2014-06-01 MED ORDER — NAPROXEN 500 MG PO TABS: 500.0000 mg | ORAL_TABLET | Freq: Two times a day (BID) | ORAL | Status: DC

## 2014-06-01 MED ORDER — CYCLOBENZAPRINE HCL 10 MG PO TABS: 10.0000 mg | ORAL_TABLET | Freq: Three times a day (TID) | ORAL | Status: DC | PRN

## 2014-06-01 MED ORDER — HYDROCODONE-ACETAMINOPHEN 5-325 MG PO TABS: 1.0000 | ORAL_TABLET | ORAL | Status: DC | PRN

## 2014-06-01 MED ORDER — AZITHROMYCIN 250 MG PO TABS: ORAL_TABLET | ORAL | Status: DC

## 2014-06-01 MED ORDER — IOHEXOL 350 MG/ML SOLN
100.0000 mL | Freq: Once | INTRAVENOUS | Status: AC | PRN
  Administered 2014-06-01: 100 mL via INTRAVENOUS

## 2014-06-01 MED ORDER — SODIUM CHLORIDE 0.9 % IV SOLN
INTRAVENOUS | Status: DC
  Administered 2014-06-01: 13:00:00 via INTRAVENOUS

## 2014-06-01 NOTE — Discharge Instructions (Signed)
Take the medications as prescribed. I put in an order for a PET scan which is only done at [facility], you should hear from them tomorrow. If you don't hear by noon, call [phone].  Follow up with the health department to finish your evaluation. Return to the ED if you get a fever, struggle to breathe or you feel worse.

## 2014-06-01 NOTE — ED Provider Notes (Signed)
CSN: [id number]     Arrival date & time 06/01/14  1156 History  This chart was scribed for Janice Norrie, MD by Starleen Arms, ED Scribe. This patient was seen in room APA06/APA06 and the patient's care was started at 12:42 PM.    Chief Complaint  Patient presents with  . Chest Pain   The history is provided by the patient. No language interpreter was used.    HPI Comments: Jerome Daniel is a 53 y.o. male with a history of COPD and PNA who presents to the Emergency Department complaining of complaining of right-sided CP that woke him this morning.  He describes the pain as "achey".  The pain is exacerbated by deep breathing and movement and relieved by laying on his right side.  He reports pain upon inspiration. He denies fever, change in cough, or shortness of breath. He has a chronic dry cough however he did quit smoking 6-8 months ago. He denies any known injury. Patient had a similar episode of pain when he was diagnosed with pneumonia in the spring of this year.  Patient uses an albuterol inhaler and Advair and is compliant.  He is prescribed Advair at the [facility].  Patient reports a non-productive cough normal to baseline.  Patient denies fever.  Patient denies recent injury.  Patient does not work currently and states he is trying to apply for disability.    Primary Care: [facility]/ [facility].    Past Medical History  Diagnosis Date  . PNA (pneumonia)   . COPD (chronic obstructive pulmonary disease)    Past Surgical History  Procedure Laterality Date  . Back surgery     History reviewed. No pertinent family history. History  Substance Use Topics  . Smoking status: Former Research scientist (life sciences)  . Smokeless tobacco: Not on file  . Alcohol Use: Yes  applying for disability for COPD Quit smoking 6-8 months ago  Review of Systems  Respiratory: Positive for cough. Negative for shortness of breath.   Cardiovascular: Positive for chest pain.  All  other systems reviewed and are negative.     Allergies  Review of patient's allergies indicates no known allergies.  Home Medications   Prior to Admission medications   Medication Sig Start Date End Date Taking? Authorizing Provider  albuterol (PROVENTIL HFA;VENTOLIN HFA) 108 (90 BASE) MCG/ACT inhaler Inhale 2 puffs into the lungs every 4 (four) hours as needed. 03/30/12  Yes Historical Provider, MD  Fluticasone-Salmeterol (ADVAIR) 100-50 MCG/DOSE AEPB Inhale 1 puff into the lungs 2 (two) times daily.   Yes Historical Provider, MD  azithromycin (ZITHROMAX Z-PAK) 250 MG tablet Take 2 po the first day then once a day for the next 4 days. 06/01/14   Janice Norrie, MD  cyclobenzaprine (FLEXERIL) 10 MG tablet Take 1 tablet (10 mg total) by mouth 3 (three) times daily as needed (pain). 06/01/14   Janice Norrie, MD  HYDROcodone-acetaminophen (NORCO/VICODIN) 5-325 MG per tablet Take 1 tablet by mouth every 4 (four) hours as needed for moderate pain. 06/01/14   Janice Norrie, MD  naproxen (NAPROSYN) 500 MG tablet Take 1 tablet (500 mg total) by mouth 2 (two) times daily. 06/01/14   Janice Norrie, MD   BP 109/58  Pulse 94  Temp(Src) 98.2 F (36.8 C) (Oral)  Resp 18  Ht 5\' 9"  (1.753 m)  Wt 195 lb (88.451 kg)  BMI 28.78 kg/m2  SpO2 98%  Vital signs normal    Physical Exam  Nursing note and vitals reviewed. Constitutional: He is oriented to person, place, and time. He appears well-developed and well-nourished.  Non-toxic appearance. He does not appear ill. He appears distressed.  Holds his right side when he breathes deeply  HENT:  Head: Normocephalic and atraumatic.  Right Ear: External ear normal.  Left Ear: External ear normal.  Nose: Nose normal. No mucosal edema or rhinorrhea.  Mouth/Throat: Oropharynx is clear and moist and mucous membranes are normal. No dental abscesses or uvula swelling.  Eyes: Conjunctivae and EOM are normal. Pupils are equal, round, and reactive to light.  Neck: Normal  range of motion and full passive range of motion without pain. Neck supple.  Cardiovascular: Normal rate, regular rhythm and normal heart sounds.  Exam reveals no gallop and no friction rub.   No murmur heard. Pulmonary/Chest: Effort normal and breath sounds normal. No respiratory distress. He has no wheezes. He has no rhonchi. He has no rales. He exhibits no tenderness and no crepitus.    Area of pain noted, but is nontender  Abdominal: Soft. Normal appearance and bowel sounds are normal. He exhibits no distension. There is no tenderness. There is no rebound and no guarding.  Musculoskeletal: Normal range of motion. He exhibits no edema and no tenderness.  Moves all extremities well.   Neurological: He is alert and oriented to person, place, and time. He has normal strength. No cranial nerve deficit.  Skin: Skin is warm, dry and intact. No rash noted. No erythema. No pallor.  Psychiatric: He has a normal mood and affect. His speech is normal and behavior is normal. His mood appears not anxious.    ED Course  Procedures (including critical care time)  DIAGNOSTIC STUDIES: Oxygen Saturation is 99% on RA, normal by my interpretation.    COORDINATION OF CARE:  12:48 PM Discussed treatment plan with patient at bedside.  Patient acknowledges and agrees with plan.    Discussed need to do CT scan because the abnormal chest x-ray. Patient is agreeable.  Patient ambulated by nursing staff with no shortness of breath. His pulse ox was 100% with heart rate 82.  Patient was given the results of his CT scan and the need to followup with his doctors at the health department. He understands this can be a lung cancer. He'll also be treated for possible pneumonia. He was given the option to be admitted, but he preferred to be discharged.   Labs Review  Results for orders placed during the hospital encounter of 06/01/14  CBC WITH DIFFERENTIAL      Result Value Ref Range   WBC 8.2  4.0 - 10.5 K/uL    RBC 4.89  4.22 - 5.81 MIL/uL   Hemoglobin 14.7  13.0 - 17.0 g/dL   HCT 45.1  39.0 - 52.0 %   MCV 92.2  78.0 - 100.0 fL   MCH 30.1  26.0 - 34.0 pg   MCHC 32.6  30.0 - 36.0 g/dL   RDW 14.7  11.5 - 15.5 %   Platelets 228  150 - 400 K/uL   Neutrophils Relative % 63  43 - 77 %   Neutro Abs 5.2  1.7 - 7.7 K/uL   Lymphocytes Relative 23  12 - 46 %   Lymphs Abs 1.9  0.7 - 4.0 K/uL   Monocytes Relative 7  3 - 12 %   Monocytes Absolute 0.6  0.1 - 1.0 K/uL   Eosinophils Relative 6 (*) 0 - 5 %   Eosinophils  Absolute 0.5  0.0 - 0.7 K/uL   Basophils Relative 1  0 - 1 %   Basophils Absolute 0.0  0.0 - 0.1 K/uL  COMPREHENSIVE METABOLIC PANEL      Result Value Ref Range   Sodium 137  137 - 147 mEq/L   Potassium 4.5  3.7 - 5.3 mEq/L   Chloride 101  96 - 112 mEq/L   CO2 27  19 - 32 mEq/L   Glucose, Bld 96  70 - 99 mg/dL   BUN 17  6 - 23 mg/dL   Creatinine, Ser 0.63  0.50 - 1.35 mg/dL   Calcium 9.1  8.4 - 10.5 mg/dL   Total Protein 7.8  6.0 - 8.3 g/dL   Albumin 3.6  3.5 - 5.2 g/dL   AST 12  0 - 37 U/L   ALT 8  0 - 53 U/L   Alkaline Phosphatase 55  39 - 117 U/L   Total Bilirubin 0.3  0.3 - 1.2 mg/dL   GFR calc non Af Amer >90  >90 mL/min   GFR calc Af Amer >90  >90 mL/min   Anion gap 9  5 - 15  TROPONIN I      Result Value Ref Range   Troponin I <0.30  <0.30 ng/mL   Laboratory interpretation all normal  Imaging Review Dg Chest 2 View  06/01/2014   CLINICAL DATA:  Right-sided chest pain  EXAM: CHEST  2 VIEW  COMPARISON:  None.  FINDINGS: Normal cardiac silhouette. There is an opacity projecting over the lateral aspect of the right lower hemi thorax which partially obscures the diaphragm and measures approximately 5 cm. Lesion difficult to localize on the lateral projection and may be within the right middle lobe. Normal pulmonary vasculature. No osseous abnormality.  IMPRESSION: Right lung opacity represents either a focus of pneumonia or a pulmonary mass. Recommend CT thorax for further  evaluation.   Electronically Signed   By: Suzy Bouchard M.D.   On: 06/01/2014 12:39   Ct Angio Chest W/cm &/or Wo Cm  06/01/2014   CLINICAL DATA:  Chest pain  EXAM: CT ANGIOGRAPHY CHEST WITH CONTRAST  TECHNIQUE: Multidetector CT imaging of the chest was performed using the standard protocol during bolus administration of intravenous contrast. Multiplanar CT image reconstructions and MIPs were obtained to evaluate the vascular anatomy.  CONTRAST:  170mL OMNIPAQUE IOHEXOL 350 MG/ML SOLN  COMPARISON:  None.  FINDINGS: There are no filling defects in the pulmonary arterial tree to suggest acute pulmonary thromboembolism.  No abnormal mediastinal adenopathy  Left lung is clear. Indeterminate abnormalities are present in the right lung parenchyma. 1.4 cm masslike opacity in the superior segment of the right lower lobe on image 50. 5.1 cm masslike opacity in the lateral basal segment of the right lower lobe on image 74. Patchy irregular opacities in the posterior basal segment of the right lower lobe. Small amount of right pleural fluid.  Left ventricular myocardial hypertrophy is suspected.  No acute bony deformity.  Review of the MIP images confirms the above findings.  IMPRESSION: No evidence of acute pulmonary thromboembolism.  Masslike opacities in the right lower lobe. PET-CT may be helpful. Malignancy is not excluded.   Electronically Signed   By: Maryclare Bean M.D.   On: 06/01/2014 14:09     EKG Interpretation   Date/Time:  Sunday June 01 2014 13:02:32 EDT Ventricular Rate:  97 PR Interval:  152 QRS Duration: 94 QT Interval:  358 QTC Calculation: 455 R  Axis:   86 Text Interpretation:  Sinus rhythm Normal ECG No old tracing to compare  Confirmed by Desirae Mancusi  MD-I, Kemaria Dedic ([id number]) on 06/01/2014 1:40:11 PM      MDM   Final diagnoses:  Pleuritic chest pain  Abnormal chest x-ray  Mass of lower lobe of right lung  CAP (community acquired pneumonia)    Discharge Medication List as of 06/01/2014  3:20  PM    START taking these medications   Details  azithromycin (ZITHROMAX Z-PAK) 250 MG tablet Take 2 po the first day then once a day for the next 4 days., Print    cyclobenzaprine (FLEXERIL) 10 MG tablet Take 1 tablet (10 mg total) by mouth 3 (three) times daily as needed (pain)., Starting 06/01/2014, Until Discontinued, Print    HYDROcodone-acetaminophen (NORCO/VICODIN) 5-325 MG per tablet Take 1 tablet by mouth every 4 (four) hours as needed for moderate pain., Starting 06/01/2014, Until Discontinued, Print    naproxen (NAPROSYN) 500 MG tablet Take 1 tablet (500 mg total) by mouth 2 (two) times daily., Starting 06/01/2014, Until Discontinued, Print        Plan discharge  Rolland Porter, MD, FACEP    I personally performed the services described in this documentation, which was scribed in my presence. The recorded information has been reviewed and considered.  Rolland Porter, MD, FACEP    Janice Norrie, MD 06/01/14 [phone]

## 2014-06-01 NOTE — ED Notes (Signed)
Pt presents with right side pain upon inspiration. States he has had pneumonia in the past and this feels similar. He is very guarded with breathing, holding his breath to keep from inhaling. Upon inspiration, his right side doesn't move much and breath sounds are diminished. Pt has no knowledge of any injury. He quit smoking approximately 6 months ago; has hx of COPD. Pt rates pain 8/10.

## 2014-06-01 NOTE — ED Notes (Signed)
Pt with c/o Sob and right lower rib pain that started this morning

## 2014-06-01 NOTE — ED Notes (Signed)
Pt ambulated around nursing desk with no sob. sats stayed 100% and hr 82. NAD.

## 2014-06-01 NOTE — ED Notes (Signed)
Pt requesting more pain meds. edp aware

## 2014-06-08 ENCOUNTER — Encounter (HOSPITAL_COMMUNITY): Payer: Self-pay | Admitting: Emergency Medicine

## 2014-06-08 ENCOUNTER — Emergency Department (HOSPITAL_COMMUNITY)
Admission: EM | Admit: 2014-06-08 | Discharge: 2014-06-08 | Disposition: A | Discharge status: Home / Self Care | Payer: Self-pay | Attending: Emergency Medicine | Admitting: Emergency Medicine

## 2014-06-08 ENCOUNTER — Emergency Department (HOSPITAL_COMMUNITY): Payer: Self-pay

## 2014-06-08 DIAGNOSIS — Z8701 Personal history of pneumonia (recurrent): Secondary | ICD-10-CM | POA: Insufficient documentation

## 2014-06-08 DIAGNOSIS — J441 Chronic obstructive pulmonary disease with (acute) exacerbation: Secondary | ICD-10-CM | POA: Insufficient documentation

## 2014-06-08 DIAGNOSIS — J189 Pneumonia, unspecified organism: Secondary | ICD-10-CM | POA: Insufficient documentation

## 2014-06-08 DIAGNOSIS — Z792 Long term (current) use of antibiotics: Secondary | ICD-10-CM | POA: Insufficient documentation

## 2014-06-08 DIAGNOSIS — J181 Lobar pneumonia, unspecified organism: Secondary | ICD-10-CM

## 2014-06-08 DIAGNOSIS — Z87891 Personal history of nicotine dependence: Secondary | ICD-10-CM | POA: Insufficient documentation

## 2014-06-08 DIAGNOSIS — IMO0002 Reserved for concepts with insufficient information to code with codable children: Secondary | ICD-10-CM | POA: Insufficient documentation

## 2014-06-08 DIAGNOSIS — R0602 Shortness of breath: Secondary | ICD-10-CM | POA: Insufficient documentation

## 2014-06-08 LAB — CBC WITH DIFFERENTIAL/PLATELET
BASOS ABS: 0 10*3/uL (ref 0.0–0.1)
BASOS PCT: 0 % (ref 0–1)
Eosinophils Absolute: 0.5 10*3/uL (ref 0.0–0.7)
Eosinophils Relative: 6 % — ABNORMAL HIGH (ref 0–5)
HCT: 45 % (ref 39.0–52.0)
HEMOGLOBIN: 14.5 g/dL (ref 13.0–17.0)
Lymphocytes Relative: 18 % (ref 12–46)
Lymphs Abs: 1.5 10*3/uL (ref 0.7–4.0)
MCH: 30 pg (ref 26.0–34.0)
MCHC: 32.2 g/dL (ref 30.0–36.0)
MCV: 93.2 fL (ref 78.0–100.0)
MONOS PCT: 6 % (ref 3–12)
Monocytes Absolute: 0.5 10*3/uL (ref 0.1–1.0)
NEUTROS ABS: 6 10*3/uL (ref 1.7–7.7)
Neutrophils Relative %: 70 % (ref 43–77)
Platelets: 199 10*3/uL (ref 150–400)
RBC: 4.83 MIL/uL (ref 4.22–5.81)
RDW: 14.5 % (ref 11.5–15.5)
WBC: 8.6 10*3/uL (ref 4.0–10.5)

## 2014-06-08 LAB — COMPREHENSIVE METABOLIC PANEL
ALBUMIN: 3.6 g/dL (ref 3.5–5.2)
ALK PHOS: 59 U/L (ref 39–117)
ALT: 9 U/L (ref 0–53)
AST: 14 U/L (ref 0–37)
Anion gap: 8 (ref 5–15)
BUN: 13 mg/dL (ref 6–23)
CHLORIDE: 98 meq/L (ref 96–112)
CO2: 32 mEq/L (ref 19–32)
Calcium: 9.1 mg/dL (ref 8.4–10.5)
Creatinine, Ser: 0.75 mg/dL (ref 0.50–1.35)
GFR calc Af Amer: >90 mL/min (ref >90)
GFR calc non Af Amer: >90 mL/min (ref >90)
Glucose, Bld: 103 mg/dL — ABNORMAL HIGH (ref 70–99)
POTASSIUM: 4 meq/L (ref 3.7–5.3)
SODIUM: 138 meq/L (ref 137–147)
TOTAL PROTEIN: 7.7 g/dL (ref 6.0–8.3)
Total Bilirubin: 0.5 mg/dL (ref 0.3–1.2)

## 2014-06-08 LAB — RAPID URINE DRUG SCREEN, HOSP PERFORMED
AMPHETAMINES: NOT DETECTED
BENZODIAZEPINES: NOT DETECTED
Barbiturates: NOT DETECTED
Cocaine: NOT DETECTED
OPIATES: POSITIVE — AB
Tetrahydrocannabinol: NOT DETECTED

## 2014-06-08 LAB — TROPONIN I: Troponin I: <0.3 ng/mL (ref <0.30)

## 2014-06-08 LAB — URINALYSIS, ROUTINE W REFLEX MICROSCOPIC
Bilirubin Urine: NEGATIVE
GLUCOSE, UA: NEGATIVE mg/dL
Hgb urine dipstick: NEGATIVE
KETONES UR: NEGATIVE mg/dL
Leukocytes, UA: NEGATIVE
Nitrite: NEGATIVE
Protein, ur: NEGATIVE mg/dL
Specific Gravity, Urine: 1.025 (ref 1.005–1.030)
Urobilinogen, UA: 0.2 mg/dL (ref 0.0–1.0)
pH: 6.5 (ref 5.0–8.0)

## 2014-06-08 LAB — PRO B NATRIURETIC PEPTIDE: Pro B Natriuretic peptide (BNP): 28.6 pg/mL (ref 0–125)

## 2014-06-08 LAB — ETHANOL

## 2014-06-08 MED ORDER — ALBUTEROL (5 MG/ML) CONTINUOUS INHALATION SOLN
15.0000 mg/h | INHALATION_SOLUTION | Freq: Once | RESPIRATORY_TRACT | Status: AC
  Administered 2014-06-08: 15 mg/h via RESPIRATORY_TRACT
  Filled 2014-06-08: qty 20

## 2014-06-08 MED ORDER — METHYLPREDNISOLONE SODIUM SUCC 125 MG IJ SOLR
125.0000 mg | Freq: Once | INTRAMUSCULAR | Status: AC
  Administered 2014-06-08: 125 mg via INTRAVENOUS
  Filled 2014-06-08: qty 2

## 2014-06-08 MED ORDER — AMOXICILLIN 500 MG PO CAPS: 500.0000 mg | ORAL_CAPSULE | Freq: Three times a day (TID) | ORAL | Status: DC

## 2014-06-08 MED ORDER — SODIUM CHLORIDE 0.9 % IV SOLN
INTRAVENOUS | Status: DC
  Administered 2014-06-08: 17:00:00 via INTRAVENOUS

## 2014-06-08 MED ORDER — NAPROXEN 500 MG PO TABS: 500.0000 mg | ORAL_TABLET | Freq: Two times a day (BID) | ORAL | Status: DC

## 2014-06-08 MED ORDER — KETOROLAC TROMETHAMINE 30 MG/ML IJ SOLN
30.0000 mg | Freq: Once | INTRAMUSCULAR | Status: AC
  Administered 2014-06-08: 30 mg via INTRAVENOUS
  Filled 2014-06-08: qty 1

## 2014-06-08 MED ORDER — IPRATROPIUM BROMIDE 0.02 % IN SOLN
0.5000 mg | Freq: Once | RESPIRATORY_TRACT | Status: AC
  Administered 2014-06-08: 0.5 mg via RESPIRATORY_TRACT
  Filled 2014-06-08: qty 2.5

## 2014-06-08 MED ORDER — PREDNISONE 20 MG PO TABS: ORAL_TABLET | ORAL | Status: DC

## 2014-06-08 MED ORDER — CYCLOBENZAPRINE HCL 10 MG PO TABS: 10.0000 mg | ORAL_TABLET | Freq: Three times a day (TID) | ORAL | Status: DC | PRN

## 2014-06-08 NOTE — ED Notes (Signed)
Patient with no complaints at this time. Respirations even and unlabored. Skin warm/dry. Discharge instructions reviewed with patient at this time. Patient given opportunity to voice concerns/ask questions. IV removed per policy and band-aid applied to site. Patient discharged at this time and left Emergency Department with steady gait.  

## 2014-06-08 NOTE — ED Notes (Signed)
Neb completed.

## 2014-06-08 NOTE — ED Notes (Signed)
Chest tightness began this morning upon waking.  Shortness of breath has worsened throughout day.  Pain generalized, worse with deep breathing.  Hospitalized 5 months ago at [facility] for PNA.  Denies fevers, nausea, lightheadedness.
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
## 2014-06-08 NOTE — Discharge Instructions (Signed)
Drink plenty of fluids. Use your albuterol inhaler when you have wheezing. Take the antibiotic until gone. Take the naproxen and flexeril for your chest wall pain. Follow up at the health department to get another Chest Xray in 3-4 weeks and hopefully the pneumonia will be gone.    Pneumonia Pneumonia is an infection of the lungs.  CAUSES Pneumonia may be caused by bacteria or a virus. Usually, these infections are caused by breathing infectious particles into the lungs (respiratory tract). SIGNS AND SYMPTOMS   Cough.  Fever.  Chest pain.  Increased rate of breathing.  Wheezing.  Mucus production. DIAGNOSIS  If you have the common symptoms of pneumonia, your health care provider will typically confirm the diagnosis with a chest X-ray. The X-ray will show an abnormality in the lung (pulmonary infiltrate) if you have pneumonia. Other tests of your blood, urine, or sputum may be done to find the specific cause of your pneumonia. Your health care provider may also do tests (blood gases or pulse oximetry) to see how well your lungs are working. TREATMENT  Some forms of pneumonia may be spread to other people when you cough or sneeze. You may be asked to wear a mask before and during your exam. Pneumonia that is caused by bacteria is treated with antibiotic medicine. Pneumonia that is caused by the influenza virus may be treated with an antiviral medicine. Most other viral infections must run their course. These infections will not respond to antibiotics.  HOME CARE INSTRUCTIONS   Cough suppressants may be used if you are losing too much rest. However, coughing protects you by clearing your lungs. You should avoid using cough suppressants if you can.  Your health care provider may have prescribed medicine if he or she thinks your pneumonia is caused by bacteria or influenza. Finish your medicine even if you start to feel better.  Your health care provider may also prescribe an expectorant.  This loosens the mucus to be coughed up.  Take medicines only as directed by your health care provider.  Do not smoke. Smoking is a common cause of bronchitis and can contribute to pneumonia. If you are a smoker and continue to smoke, your cough may last several weeks after your pneumonia has cleared.  A cold steam vaporizer or humidifier in your room or home may help loosen mucus.  Coughing is often worse at night. Sleeping in a semi-upright position in a recliner or using a couple pillows under your head will help with this.  Get rest as you feel it is needed. Your body will usually let you know when you need to rest. PREVENTION A pneumococcal shot (vaccine) is available to prevent a common bacterial cause of pneumonia. This is usually suggested for:  People over 55 years old.  Patients on chemotherapy.  People with chronic lung problems, such as bronchitis or emphysema.  People with immune system problems. If you are over 65 or have a high risk condition, you may receive the pneumococcal vaccine if you have not received it before. In some countries, a routine influenza vaccine is also recommended. This vaccine can help prevent some cases of pneumonia.You may be offered the influenza vaccine as part of your care. If you smoke, it is time to quit. You may receive instructions on how to stop smoking. Your health care provider can provide medicines and counseling to help you quit. SEEK MEDICAL CARE IF: You have a fever. SEEK IMMEDIATE MEDICAL CARE IF:   Your illness becomes  worse. This is especially true if you are elderly or weakened from any other disease.  You cannot control your cough with suppressants and are losing sleep.  You begin coughing up blood.  You develop pain which is getting worse or is uncontrolled with medicines.  Any of the symptoms which initially brought you in for treatment are getting worse rather than better.  You develop shortness of breath or chest  pain. MAKE SURE YOU:   Understand these instructions.  Will watch your condition.  Will get help right away if you are not doing well or get worse. Document Released: 10/03/2005 Document Revised: 02/17/2014 Document Reviewed: 12/23/2010 [facility] Patient Information 2015 [place], [place]. This information is not intended to replace advice given to you by your health care provider. Make sure you discuss any questions you have with your health care provider.  Chronic Obstructive Pulmonary Disease Exacerbation Chronic obstructive pulmonary disease (COPD) is a common lung condition in which airflow from the lungs is limited. COPD is a general term that can be used to describe many different lung problems that limit airflow, including chronic bronchitis and emphysema. COPD exacerbations are episodes when breathing symptoms become much worse and require extra treatment. Without treatment, COPD exacerbations can be life threatening, and frequent COPD exacerbations can cause further damage to your lungs. CAUSES   Respiratory infections.   Exposure to smoke.   Exposure to air pollution, chemical fumes, or dust. Sometimes there is no apparent cause or trigger. RISK FACTORS  Smoking cigarettes.  Older age.  Frequent prior COPD exacerbations. SIGNS AND SYMPTOMS   Increased coughing.   Increased thick spit (sputum) production.   Increased wheezing.   Increased shortness of breath.   Rapid breathing.   Chest tightness. DIAGNOSIS  Your medical history, a physical exam, and tests will help your health care provider make a diagnosis. Tests may include:  A chest X-ray.  Basic lab tests.  Sputum testing.  An arterial blood gas test. TREATMENT  Depending on the severity of your COPD exacerbation, you may need to be admitted to a hospital for treatment. Some of the treatments commonly used to treat COPD exacerbations are:   Antibiotic medicines.   Bronchodilators. These are  drugs that expand the air passages. They may be given with an inhaler or nebulizer. Spacer devices may be needed to help improve drug delivery.  Corticosteroid medicines.  Supplemental oxygen therapy.  HOME CARE INSTRUCTIONS   Do not smoke. Quitting smoking is very important to prevent COPD from getting worse and exacerbations from happening as often.  Avoid exposure to all substances that irritate the airway, especially to tobacco smoke.   If you were prescribed an antibiotic medicine, finish it all even if you start to feel better.  Take all medicines as directed by your health care provider.It is important to use correct technique with inhaled medicines.  Drink enough fluids to keep your urine clear or pale yellow (unless you have a medical condition that requires fluid restriction).  Use a cool mist vaporizer. This makes it easier to clear your chest when you cough.   If you have a home nebulizer and oxygen, continue to use them as directed.   Maintain all necessary vaccinations to prevent infections.   Exercise regularly.   Eat a healthy diet.   Keep all follow-up appointments as directed by your health care provider. SEEK IMMEDIATE MEDICAL CARE IF:  You have worsening shortness of breath.   You have trouble talking.   You have  severe chest pain.  You have blood in your sputum.  You have a fever.  You have weakness, vomit repeatedly, or faint.   You feel confused.   You continue to get worse. MAKE SURE YOU:   Understand these instructions.  Will watch your condition.  Will get help right away if you are not doing well or get worse. Document Released: 07/31/2007 Document Revised: 02/17/2014 Document Reviewed: 06/07/2013 [facility] Patient Information 2015 [place], [place]. This information is not intended to replace advice given to you by your health care provider. Make sure you discuss any questions you have with your health care provider.

## 2014-06-13 ENCOUNTER — Encounter (HOSPITAL_COMMUNITY): Payer: Self-pay | Admitting: Emergency Medicine

## 2014-06-18 ENCOUNTER — Ambulatory Visit (HOSPITAL_COMMUNITY): Payer: Self-pay

## 2014-06-19 ENCOUNTER — Other Ambulatory Visit (HOSPITAL_COMMUNITY): Payer: Self-pay | Admitting: Emergency Medicine

## 2014-06-19 DIAGNOSIS — R9389 Abnormal findings on diagnostic imaging of other specified body structures: Secondary | ICD-10-CM

## 2014-06-19 NOTE — Progress Notes (Signed)
This encounter was created in error - please disregard.

## 2014-06-20 ENCOUNTER — Encounter (HOSPITAL_COMMUNITY): Admission: RE | Admit: 2014-06-20 | Payer: Self-pay | Source: Ambulatory Visit

## 2014-07-03 ENCOUNTER — Other Ambulatory Visit (HOSPITAL_COMMUNITY): Payer: Self-pay | Admitting: Hematology and Oncology

## 2014-07-03 ENCOUNTER — Encounter (HOSPITAL_COMMUNITY): Payer: Self-pay | Admitting: Lab

## 2014-07-03 ENCOUNTER — Ambulatory Visit (HOSPITAL_COMMUNITY): Payer: Self-pay

## 2014-07-03 DIAGNOSIS — R9389 Abnormal findings on diagnostic imaging of other specified body structures: Secondary | ICD-10-CM

## 2014-07-03 NOTE — Progress Notes (Signed)
I called patient and left message and also sent him a letter on 9/17 with appointments.

## 2014-07-04 NOTE — Progress Notes (Signed)
This encounter was created in error - please disregard.

## 2014-07-07 ENCOUNTER — Encounter (HOSPITAL_COMMUNITY): Payer: Self-pay | Admitting: Lab

## 2014-07-07 NOTE — Progress Notes (Signed)
I was unable to contact patient.  So then I called contact Claiborne Billings ( [phone]  )  and told her of appointments and she said she would contact Remo Lipps with his appointments.

## 2014-07-10 ENCOUNTER — Encounter (HOSPITAL_COMMUNITY): Payer: Self-pay

## 2014-07-10 ENCOUNTER — Encounter (HOSPITAL_COMMUNITY)
Admission: RE | Admit: 2014-07-10 | Discharge: 2014-07-10 | Disposition: A | Discharge status: Home / Self Care | Payer: Self-pay | Source: Ambulatory Visit | Attending: Diagnostic Radiology | Admitting: Diagnostic Radiology

## 2014-07-10 DIAGNOSIS — J449 Chronic obstructive pulmonary disease, unspecified: Secondary | ICD-10-CM | POA: Insufficient documentation

## 2014-07-10 DIAGNOSIS — J4489 Other specified chronic obstructive pulmonary disease: Secondary | ICD-10-CM | POA: Insufficient documentation

## 2014-07-10 DIAGNOSIS — R9389 Abnormal findings on diagnostic imaging of other specified body structures: Secondary | ICD-10-CM

## 2014-07-10 DIAGNOSIS — J984 Other disorders of lung: Secondary | ICD-10-CM | POA: Insufficient documentation

## 2014-07-10 DIAGNOSIS — F17200 Nicotine dependence, unspecified, uncomplicated: Secondary | ICD-10-CM | POA: Insufficient documentation

## 2014-07-10 DIAGNOSIS — R599 Enlarged lymph nodes, unspecified: Secondary | ICD-10-CM | POA: Insufficient documentation

## 2014-07-10 LAB — GLUCOSE, CAPILLARY: GLUCOSE-CAPILLARY: 94 mg/dL (ref 70–99)

## 2014-07-10 MED ORDER — FLUDEOXYGLUCOSE F - 18 (FDG) INJECTION
10.1100 | Freq: Once | INTRAVENOUS | Status: AC | PRN
  Administered 2014-07-10: 10.11 via INTRAVENOUS

## 2014-07-15 ENCOUNTER — Encounter (HOSPITAL_COMMUNITY): Payer: Self-pay

## 2014-07-15 ENCOUNTER — Encounter (HOSPITAL_COMMUNITY): Payer: Self-pay | Attending: Hematology and Oncology

## 2014-07-15 ENCOUNTER — Emergency Department (HOSPITAL_COMMUNITY)
Admission: EM | Admit: 2014-07-15 | Discharge: 2014-07-15 | Disposition: A | Discharge status: Home / Self Care | Payer: Self-pay

## 2014-07-15 VITALS — BP 136/62 | HR 77 | Temp 98.2°F | Resp 18 | Wt 208.9 lb

## 2014-07-15 DIAGNOSIS — R942 Abnormal results of pulmonary function studies: Secondary | ICD-10-CM | POA: Insufficient documentation

## 2014-07-15 DIAGNOSIS — R9389 Abnormal findings on diagnostic imaging of other specified body structures: Secondary | ICD-10-CM

## 2014-07-15 DIAGNOSIS — R0789 Other chest pain: Secondary | ICD-10-CM

## 2014-07-15 DIAGNOSIS — J438 Other emphysema: Secondary | ICD-10-CM

## 2014-07-15 DIAGNOSIS — J4489 Other specified chronic obstructive pulmonary disease: Secondary | ICD-10-CM | POA: Insufficient documentation

## 2014-07-15 DIAGNOSIS — J449 Chronic obstructive pulmonary disease, unspecified: Secondary | ICD-10-CM | POA: Insufficient documentation

## 2014-07-15 LAB — CBC WITH DIFFERENTIAL/PLATELET
BASOS ABS: 0 10*3/uL (ref 0.0–0.1)
Basophils Relative: 1 % (ref 0–1)
EOS PCT: 6 % — AB (ref 0–5)
Eosinophils Absolute: 0.4 10*3/uL (ref 0.0–0.7)
HEMATOCRIT: 45 % (ref 39.0–52.0)
HEMOGLOBIN: 15.2 g/dL (ref 13.0–17.0)
LYMPHS PCT: 24 % (ref 12–46)
Lymphs Abs: 1.5 10*3/uL (ref 0.7–4.0)
MCH: 31.3 pg (ref 26.0–34.0)
MCHC: 33.8 g/dL (ref 30.0–36.0)
MCV: 92.6 fL (ref 78.0–100.0)
MONO ABS: 0.5 10*3/uL (ref 0.1–1.0)
MONOS PCT: 7 % (ref 3–12)
NEUTROS ABS: 3.8 10*3/uL (ref 1.7–7.7)
Neutrophils Relative %: 62 % (ref 43–77)
Platelets: 201 10*3/uL (ref 150–400)
RBC: 4.86 MIL/uL (ref 4.22–5.81)
RDW: 13.5 % (ref 11.5–15.5)
WBC: 6.1 10*3/uL (ref 4.0–10.5)

## 2014-07-15 LAB — COMPREHENSIVE METABOLIC PANEL
ALBUMIN: 3.7 g/dL (ref 3.5–5.2)
ALT: 11 U/L (ref 0–53)
AST: 18 U/L (ref 0–37)
Alkaline Phosphatase: 72 U/L (ref 39–117)
Anion gap: 12 (ref 5–15)
BUN: 14 mg/dL (ref 6–23)
CALCIUM: 9.3 mg/dL (ref 8.4–10.5)
CO2: 26 meq/L (ref 19–32)
Chloride: 101 mEq/L (ref 96–112)
Creatinine, Ser: 0.8 mg/dL (ref 0.50–1.35)
GFR calc Af Amer: >90 mL/min (ref >90)
Glucose, Bld: 96 mg/dL (ref 70–99)
Potassium: 4.2 mEq/L (ref 3.7–5.3)
SODIUM: 139 meq/L (ref 137–147)
Total Bilirubin: 0.2 mg/dL — ABNORMAL LOW (ref 0.3–1.2)
Total Protein: 7.8 g/dL (ref 6.0–8.3)

## 2014-07-15 LAB — LACTATE DEHYDROGENASE: LDH: 227 U/L (ref 94–250)

## 2014-07-15 NOTE — ED Notes (Signed)
Pt states he wants some pain pills because he hurts. Declined to check in for evaluation by EDP

## 2014-07-15 NOTE — Patient Instructions (Signed)
[facility] Discharge Instructions  RECOMMENDATIONS MADE BY THE CONSULTANT AND ANY TEST RESULTS WILL BE SENT TO YOUR REFERRING PHYSICIAN.  No follow up needed at this time. We will call you if any of your labs are abnormal or need further follow up.   Thank you for choosing [facility] to provide your oncology and hematology care.  To afford each patient quality time with our providers, please arrive at least 15 minutes before your scheduled appointment time.  With your help, our goal is to use those 15 minutes to complete the necessary work-up to ensure our physicians have the information they need to help with your evaluation and healthcare recommendations.    Effective January 1st, 2014, we ask that you re-schedule your appointment with our physicians should you arrive 10 or more minutes late for your appointment.  We strive to give you quality time with our providers, and arriving late affects you and other patients whose appointments are after yours.    Again, thank you for choosing [facility].  Our hope is that these requests will decrease the amount of time that you wait before being seen by our physicians.       _____________________________________________________________  Should you have questions after your visit to [facility], please contact our office at [phone] between the hours of 8:30 a.m. and 4:30 p.m.  Voicemails left after 4:30 p.m. will not be returned until the following business day.  For prescription refill requests, have your pharmacy contact our office with your prescription refill request.    _______________________________________________________________  We hope that we have given you very good care.  You may receive a patient satisfaction survey in the mail, please complete it and return it as soon as possible.  We value your  feedback!  _______________________________________________________________  Have you asked about our STAR program?  STAR stands for Survivorship Training and Rehabilitation, and this is a nationally recognized cancer care program that focuses on survivorship and rehabilitation.  Cancer and cancer treatments may cause problems, such as, pain, making you feel tired and keeping you from doing the things that you need or want to do. Cancer rehabilitation can help. Our goal is to reduce these troubling effects and help you have the best quality of life possible.  You may receive a survey from a nurse that asks questions about your current state of health.  Based on the survey results, all eligible patients will be referred to the [facility] program for an evaluation so we can better serve you!  A frequently asked questions sheet is available upon request.

## 2014-07-15 NOTE — Progress Notes (Signed)
Jerome Daniel's reason for visit today is for labs as scheduled per MD orders.  Venipuncture performed with a 23 gauge butterfly needle to R Antecubital.  Jerome Daniel tolerated procedure well and without incident; questions were answered and patient was discharged.  Pt. Requested narcotic medication at arrival.  MD was notified. No further follow up needed at this clinic.  At discharge pt. Became irate when he was told he was not getting a prescription and would need to follow up with his doctor.
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
## 2014-07-17 LAB — ANGIOTENSIN CONVERTING ENZYME: Angiotensin-Converting Enzyme: 37 U/L (ref 8–52)

## 2014-08-25 ENCOUNTER — Emergency Department (HOSPITAL_COMMUNITY)
Admission: EM | Admit: 2014-08-25 | Discharge: 2014-08-25 | Disposition: A | Discharge status: Home / Self Care | Payer: Self-pay | Attending: Emergency Medicine | Admitting: Emergency Medicine

## 2014-08-25 ENCOUNTER — Emergency Department (HOSPITAL_COMMUNITY): Payer: Self-pay

## 2014-08-25 ENCOUNTER — Encounter (HOSPITAL_COMMUNITY): Payer: Self-pay | Admitting: *Deleted

## 2014-08-25 DIAGNOSIS — R059 Cough, unspecified: Secondary | ICD-10-CM

## 2014-08-25 DIAGNOSIS — G8929 Other chronic pain: Secondary | ICD-10-CM | POA: Insufficient documentation

## 2014-08-25 DIAGNOSIS — Z7982 Long term (current) use of aspirin: Secondary | ICD-10-CM | POA: Insufficient documentation

## 2014-08-25 DIAGNOSIS — Z8701 Personal history of pneumonia (recurrent): Secondary | ICD-10-CM | POA: Insufficient documentation

## 2014-08-25 DIAGNOSIS — Z87891 Personal history of nicotine dependence: Secondary | ICD-10-CM | POA: Insufficient documentation

## 2014-08-25 DIAGNOSIS — Z7951 Long term (current) use of inhaled steroids: Secondary | ICD-10-CM | POA: Insufficient documentation

## 2014-08-25 DIAGNOSIS — J441 Chronic obstructive pulmonary disease with (acute) exacerbation: Secondary | ICD-10-CM | POA: Insufficient documentation

## 2014-08-25 DIAGNOSIS — R0789 Other chest pain: Secondary | ICD-10-CM | POA: Insufficient documentation

## 2014-08-25 DIAGNOSIS — Z791 Long term (current) use of non-steroidal anti-inflammatories (NSAID): Secondary | ICD-10-CM | POA: Insufficient documentation

## 2014-08-25 DIAGNOSIS — Z79899 Other long term (current) drug therapy: Secondary | ICD-10-CM | POA: Insufficient documentation

## 2014-08-25 DIAGNOSIS — R05 Cough: Secondary | ICD-10-CM

## 2014-08-25 LAB — BASIC METABOLIC PANEL
Anion gap: 11 (ref 5–15)
BUN: 22 mg/dL (ref 6–23)
CALCIUM: 9.2 mg/dL (ref 8.4–10.5)
CO2: 27 mEq/L (ref 19–32)
CREATININE: 0.93 mg/dL (ref 0.50–1.35)
Chloride: 98 mEq/L (ref 96–112)
GFR calc Af Amer: >90 mL/min (ref >90)
GFR calc non Af Amer: >90 mL/min (ref >90)
GLUCOSE: 93 mg/dL (ref 70–99)
POTASSIUM: 3.9 meq/L (ref 3.7–5.3)
Sodium: 136 mEq/L — ABNORMAL LOW (ref 137–147)

## 2014-08-25 LAB — CBC WITH DIFFERENTIAL/PLATELET
BASOS PCT: 0 % (ref 0–1)
Basophils Absolute: 0 10*3/uL (ref 0.0–0.1)
EOS PCT: 4 % (ref 0–5)
Eosinophils Absolute: 0.4 10*3/uL (ref 0.0–0.7)
HEMATOCRIT: 49.1 % (ref 39.0–52.0)
Hemoglobin: 16.7 g/dL (ref 13.0–17.0)
LYMPHS ABS: 1.8 10*3/uL (ref 0.7–4.0)
Lymphocytes Relative: 15 % (ref 12–46)
MCH: 32 pg (ref 26.0–34.0)
MCHC: 34 g/dL (ref 30.0–36.0)
MCV: 94.1 fL (ref 78.0–100.0)
MONO ABS: 0.9 10*3/uL (ref 0.1–1.0)
MONOS PCT: 7 % (ref 3–12)
Neutro Abs: 9 10*3/uL — ABNORMAL HIGH (ref 1.7–7.7)
Neutrophils Relative %: 74 % (ref 43–77)
Platelets: 202 10*3/uL (ref 150–400)
RBC: 5.22 MIL/uL (ref 4.22–5.81)
RDW: 13.2 % (ref 11.5–15.5)
WBC: 12.2 10*3/uL — ABNORMAL HIGH (ref 4.0–10.5)

## 2014-08-25 MED ORDER — PREDNISONE 20 MG PO TABS: ORAL_TABLET | ORAL | Status: DC

## 2014-08-25 MED ORDER — HYDROCODONE-ACETAMINOPHEN 5-325 MG PO TABS
1.0000 | ORAL_TABLET | Freq: Once | ORAL | Status: AC
  Administered 2014-08-25: 1 via ORAL
  Filled 2014-08-25: qty 1

## 2014-08-25 MED ORDER — HYDROCODONE-ACETAMINOPHEN 5-325 MG PO TABS: 1.0000 | ORAL_TABLET | Freq: Four times a day (QID) | ORAL | Status: DC | PRN

## 2014-08-25 NOTE — Discharge Instructions (Signed)
You appear to have an upper respiratory infection (URI). An upper respiratory tract infection, or cold, is a viral infection of the air passages leading to the lungs. It is contagious and can be spread to others, especially during the first 3 or 4 days. It cannot be cured by antibiotics or other medicines. RETURN IMMEDIATELY IF you develop shortness of breath, confusion or altered mental status, a new rash, become dizzy, faint, or poorly responsive, or are unable to be cared for at home.  You have been diagnosed by your caregiver as having chest wall pain. SEEK IMMEDIATE MEDICAL ATTENTION IF: You develop a fever.  Your chest pains become severe or intolerable.  You develop new, unexplained symptoms (problems).  You develop shortness of breath, nausea, vomiting, sweating or feel light headed.  You develop a new cough or you cough up blood.  Chronic Pain Discharge Instructions  Emergency care providers appreciate that many patients coming to [place] are in severe pain and we wish to address their pain in the safest, most responsible manner.  It is important to recognize however, that the proper treatment of chronic pain differs from that of the pain of injuries and acute illnesses.  Our goal is to provide quality, safe, personalized care and we thank you for giving [place] the opportunity to serve you. The use of narcotics and related agents for chronic pain syndromes may lead to additional physical and psychological problems.  Nearly as many people die from prescription narcotics each year as die from car crashes.  Additionally, this risk is increased if such prescriptions are obtained from a variety of sources.  Therefore, only your primary care physician or a pain management specialist is able to safely treat such syndromes with narcotic medications long-term.    Documentation revealing such prescriptions have been sought from multiple sources may prohibit [place] from providing a refill or different narcotic  medication.  Your name may be checked first through the [place].  This database is a record of controlled substance medication prescriptions that the patient has received.  This has been established by [facility] in an effort to eliminate the dangerous, and often life threatening, practice of obtaining multiple prescriptions from different medical providers.   If you have a chronic pain syndrome (i.e. chronic headaches, recurrent back or neck pain, dental pain, abdominal or pelvis pain without a specific diagnosis, or neuropathic pain such as fibromyalgia) or recurrent visits for the same condition without an acute diagnosis, you may be treated with non-narcotics and other non-addictive medicines.  Allergic reactions or negative side effects that may be reported by a patient to such medications will not typically lead to the use of a narcotic analgesic or other controlled substance as an alternative.   Patients managing chronic pain with a personal physician should have provisions in place for breakthrough pain.  If you are in crisis, you should call your physician.  If your physician directs you to the emergency department, please have the doctor call and speak to our attending physician concerning your care.   When patients come to the Emergency Department (ED) with acute medical conditions in which the Emergency Department physician feels appropriate to prescribe narcotic or sedating pain medication, the physician will prescribe these in very limited quantities.  The amount of these medications will last only until you can see your primary care physician in his/her office.  Any patient who returns to the ED seeking refills should expect only non-narcotic pain medications.  In the event of an acute medical condition exists and the emergency physician feels it is necessary that the patient be given a narcotic or sedating medication -  a responsible adult  driver should be present in the room prior to the medication being given by the nurse.   Prescriptions for narcotic or sedating medications that have been lost, stolen or expired will not be refilled in the Emergency Department.    Patients who have chronic pain may receive non-narcotic prescriptions until seen by their primary care physician.  It is every patients personal responsibility to maintain active prescriptions with his or her primary care physician or specialist.

## 2014-08-25 NOTE — ED Notes (Signed)
Pt refused repeat vital signs.
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
## 2014-08-25 NOTE — ED Notes (Signed)
Pain bil lower ant ribs, onset this am.  Hx of copd, cough, ,  No fever.   Hurts to take a deep breath and cough

## 2014-09-22 ENCOUNTER — Emergency Department (HOSPITAL_COMMUNITY): Payer: Self-pay

## 2014-09-22 ENCOUNTER — Emergency Department (HOSPITAL_COMMUNITY)
Admission: EM | Admit: 2014-09-22 | Discharge: 2014-09-22 | Disposition: A | Discharge status: Home / Self Care | Payer: Self-pay | Attending: Emergency Medicine | Admitting: Emergency Medicine

## 2014-09-22 ENCOUNTER — Encounter (HOSPITAL_COMMUNITY): Payer: Self-pay | Admitting: Cardiology

## 2014-09-22 DIAGNOSIS — Y9389 Activity, other specified: Secondary | ICD-10-CM | POA: Insufficient documentation

## 2014-09-22 DIAGNOSIS — Z7951 Long term (current) use of inhaled steroids: Secondary | ICD-10-CM | POA: Insufficient documentation

## 2014-09-22 DIAGNOSIS — G8929 Other chronic pain: Secondary | ICD-10-CM | POA: Insufficient documentation

## 2014-09-22 DIAGNOSIS — Z8701 Personal history of pneumonia (recurrent): Secondary | ICD-10-CM | POA: Insufficient documentation

## 2014-09-22 DIAGNOSIS — S20219A Contusion of unspecified front wall of thorax, initial encounter: Secondary | ICD-10-CM

## 2014-09-22 DIAGNOSIS — J449 Chronic obstructive pulmonary disease, unspecified: Secondary | ICD-10-CM | POA: Insufficient documentation

## 2014-09-22 DIAGNOSIS — Z8719 Personal history of other diseases of the digestive system: Secondary | ICD-10-CM | POA: Insufficient documentation

## 2014-09-22 DIAGNOSIS — Y998 Other external cause status: Secondary | ICD-10-CM | POA: Insufficient documentation

## 2014-09-22 DIAGNOSIS — S20211A Contusion of right front wall of thorax, initial encounter: Secondary | ICD-10-CM | POA: Insufficient documentation

## 2014-09-22 DIAGNOSIS — Y9289 Other specified places as the place of occurrence of the external cause: Secondary | ICD-10-CM | POA: Insufficient documentation

## 2014-09-22 DIAGNOSIS — W19XXXA Unspecified fall, initial encounter: Secondary | ICD-10-CM

## 2014-09-22 DIAGNOSIS — Z87891 Personal history of nicotine dependence: Secondary | ICD-10-CM | POA: Insufficient documentation

## 2014-09-22 DIAGNOSIS — Z7982 Long term (current) use of aspirin: Secondary | ICD-10-CM | POA: Insufficient documentation

## 2014-09-22 DIAGNOSIS — Z7952 Long term (current) use of systemic steroids: Secondary | ICD-10-CM | POA: Insufficient documentation

## 2014-09-22 MED ORDER — TRAMADOL HCL 50 MG PO TABS: 50.0000 mg | ORAL_TABLET | Freq: Four times a day (QID) | ORAL | Status: DC | PRN

## 2014-09-22 MED ORDER — IBUPROFEN 800 MG PO TABS
800.0000 mg | ORAL_TABLET | Freq: Once | ORAL | Status: AC
  Administered 2014-09-22: 800 mg via ORAL

## 2014-09-22 MED ORDER — IBUPROFEN 800 MG PO TABS
ORAL_TABLET | ORAL | Status: AC
  Filled 2014-09-22: qty 1

## 2014-09-22 MED ORDER — IBUPROFEN 600 MG PO TABS: 600.0000 mg | ORAL_TABLET | Freq: Four times a day (QID) | ORAL | Status: DC | PRN

## 2014-09-22 MED ORDER — HYDROCODONE-ACETAMINOPHEN 5-325 MG PO TABS
1.0000 | ORAL_TABLET | Freq: Once | ORAL | Status: AC
  Administered 2014-09-22: 1 via ORAL
  Filled 2014-09-22: qty 1

## 2014-09-22 MED ORDER — TRAMADOL HCL 50 MG PO TABS
50.0000 mg | ORAL_TABLET | Freq: Once | ORAL | Status: AC
  Administered 2014-09-22: 50 mg via ORAL
  Filled 2014-09-22: qty 1
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
## 2014-09-22 NOTE — Discharge Instructions (Signed)
Rib Contusion °A rib contusion (bruise) can occur by a blow to the chest or by a fall against a hard object. Usually these will be much better in a couple weeks. If X-rays were taken today and there are no broken bones (fractures), the diagnosis of bruising is made. However, broken ribs may not show up for several days, or may be discovered later on a routine X-ray when signs of healing show up. If this happens to you, it does not mean that something was missed on the X-ray, but simply that it did not show up on the first X-rays. Earlier diagnosis will not usually change the treatment. °HOME CARE INSTRUCTIONS  °· Avoid strenuous activity. Be careful during activities and avoid bumping the injured ribs. Activities that pull on the injured ribs and cause pain should be avoided, if possible. °· For the first day or two, an ice pack used every 20 minutes while awake may be helpful. Put ice in a plastic bag and put a towel between the bag and the skin. °· Eat a normal, well-balanced diet. Drink plenty of fluids to avoid constipation. °· Take deep breaths several times a day to keep lungs free of infection. Try to cough several times a day. Splint the injured area with a pillow while coughing to ease pain. Coughing can help prevent pneumonia. °· Wear a rib belt or binder only if told to do so by your caregiver. If you are wearing a rib belt or binder, you must do the breathing exercises as directed by your caregiver. If not used properly, rib belts or binders restrict breathing which can lead to pneumonia. °· Only take over-the-counter or prescription medicines for pain, discomfort, or fever as directed by your caregiver. °SEEK MEDICAL CARE IF:  °· You or your child has an oral temperature above 102° F (38.9° C). °· Your baby is older than 3 months with a rectal temperature of 100.5° F (38.1° C) or higher for more than 1 day. °· You develop a cough, with thick or bloody sputum. °SEEK IMMEDIATE MEDICAL CARE IF:  °· You  have difficulty breathing. °· You feel sick to your stomach (nausea), have vomiting or belly (abdominal) pain. °· You have worsening pain, not controlled with medications, or there is a change in the location of the pain. °· You develop sweating or radiation of the pain into the arms, jaw or shoulders, or become light headed or faint. °· You or your child has an oral temperature above 102° F (38.9° C), not controlled by medicine. °· Your or your baby is older than 3 months with a rectal temperature of 102° F (38.9° C) or higher. °· Your baby is 3 months old or younger with a rectal temperature of 100.4° F (38° C) or higher. °MAKE SURE YOU:  °· Understand these instructions. °· Will watch your condition. °· Will get help right away if you are not doing well or get worse. °Document Released: 06/28/2001 Document Revised: 01/28/2013 Document Reviewed: 05/21/2008 °ExitCare® Patient Information ©2015 [employer]. This information is not intended to replace advice given to you by your health care provider. Make sure you discuss any questions you have with your health care provider. ° °
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
## 2014-09-24 ENCOUNTER — Encounter (HOSPITAL_COMMUNITY): Payer: Self-pay | Admitting: *Deleted

## 2014-09-24 ENCOUNTER — Emergency Department (HOSPITAL_COMMUNITY)
Admission: EM | Admit: 2014-09-24 | Discharge: 2014-09-24 | Disposition: A | Discharge status: Home / Self Care | Payer: Self-pay | Attending: Emergency Medicine | Admitting: Emergency Medicine

## 2014-09-24 DIAGNOSIS — G8929 Other chronic pain: Secondary | ICD-10-CM | POA: Insufficient documentation

## 2014-09-24 DIAGNOSIS — Y998 Other external cause status: Secondary | ICD-10-CM | POA: Insufficient documentation

## 2014-09-24 DIAGNOSIS — Z7982 Long term (current) use of aspirin: Secondary | ICD-10-CM | POA: Insufficient documentation

## 2014-09-24 DIAGNOSIS — Z87891 Personal history of nicotine dependence: Secondary | ICD-10-CM | POA: Insufficient documentation

## 2014-09-24 DIAGNOSIS — Z8701 Personal history of pneumonia (recurrent): Secondary | ICD-10-CM | POA: Insufficient documentation

## 2014-09-24 DIAGNOSIS — J449 Chronic obstructive pulmonary disease, unspecified: Secondary | ICD-10-CM | POA: Insufficient documentation

## 2014-09-24 DIAGNOSIS — Y9289 Other specified places as the place of occurrence of the external cause: Secondary | ICD-10-CM | POA: Insufficient documentation

## 2014-09-24 DIAGNOSIS — Y9389 Activity, other specified: Secondary | ICD-10-CM | POA: Insufficient documentation

## 2014-09-24 DIAGNOSIS — S20219A Contusion of unspecified front wall of thorax, initial encounter: Secondary | ICD-10-CM | POA: Insufficient documentation

## 2014-09-24 MED ORDER — CYCLOBENZAPRINE HCL 10 MG PO TABS: 10.0000 mg | ORAL_TABLET | Freq: Three times a day (TID) | ORAL | Status: DC | PRN

## 2014-09-24 MED ORDER — OXYCODONE-ACETAMINOPHEN 5-325 MG PO TABS
1.0000 | ORAL_TABLET | Freq: Once | ORAL | Status: AC
  Administered 2014-09-24: 1 via ORAL
  Filled 2014-09-24: qty 1

## 2014-09-24 MED ORDER — OXYCODONE-ACETAMINOPHEN 5-325 MG PO TABS: 1.0000 | ORAL_TABLET | ORAL | Status: DC | PRN

## 2014-09-24 NOTE — ED Notes (Signed)
Pt states continued rib pain since last visit (seen for a fall). Pt states "I need something for this pain, whatever they gave me wasn't any good"

## 2014-09-24 NOTE — Discharge Instructions (Signed)
Chest Contusion °A contusion is a deep bruise. Bruises happen when an injury causes bleeding under the skin. Signs of bruising include pain, puffiness (swelling), and discolored skin. The bruise may turn blue, purple, or yellow.  °HOME CARE °· Put ice on the injured area. °¨ Put ice in a plastic bag. °¨ Place a towel between the skin and the bag. °¨ Leave the ice on for 15-20 minutes at a time, 03-04 times a day for the first 48 hours. °· Only take medicine as told by your doctor. °· Rest. °· Take deep breaths (deep-breathing exercises) as told by your doctor. °· Stop smoking if you smoke. °· Do not lift objects over 5 pounds (2.3 kilograms) for 3 days or longer if told by your doctor. °GET HELP RIGHT AWAY IF:  °· You have more bruising or puffiness. °· You have pain that gets worse. °· You have trouble breathing. °· You are dizzy, weak, or pass out (faint). °· You have blood in your pee (urine) or poop (stool). °· You cough up or throw up (vomit) blood. °· Your puffiness or pain is not helped with medicines. °MAKE SURE YOU:  °· Understand these instructions. °· Will watch your condition. °· Will get help right away if you are not doing well or get worse. °Document Released: 03/21/2008 Document Revised: 06/27/2012 Document Reviewed: 03/26/2012 °ExitCare® Patient Information ©2015 [employer]. This information is not intended to replace advice given to you by your health care provider. Make sure you discuss any questions you have with your health care provider. ° °
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
## 2014-11-03 ENCOUNTER — Emergency Department (HOSPITAL_COMMUNITY)
Admission: EM | Admit: 2014-11-03 | Discharge: 2014-11-03 | Disposition: A | Discharge status: Home / Self Care | Payer: Self-pay | Attending: Emergency Medicine | Admitting: Emergency Medicine

## 2014-11-03 ENCOUNTER — Encounter (HOSPITAL_COMMUNITY): Payer: Self-pay | Admitting: Emergency Medicine

## 2014-11-03 ENCOUNTER — Emergency Department (HOSPITAL_COMMUNITY): Payer: Self-pay

## 2014-11-03 DIAGNOSIS — G8929 Other chronic pain: Secondary | ICD-10-CM | POA: Insufficient documentation

## 2014-11-03 DIAGNOSIS — R05 Cough: Secondary | ICD-10-CM

## 2014-11-03 DIAGNOSIS — Z87891 Personal history of nicotine dependence: Secondary | ICD-10-CM | POA: Insufficient documentation

## 2014-11-03 DIAGNOSIS — R079 Chest pain, unspecified: Secondary | ICD-10-CM | POA: Insufficient documentation

## 2014-11-03 DIAGNOSIS — J441 Chronic obstructive pulmonary disease with (acute) exacerbation: Secondary | ICD-10-CM | POA: Insufficient documentation

## 2014-11-03 DIAGNOSIS — R059 Cough, unspecified: Secondary | ICD-10-CM

## 2014-11-03 DIAGNOSIS — Z8701 Personal history of pneumonia (recurrent): Secondary | ICD-10-CM | POA: Insufficient documentation

## 2014-11-03 DIAGNOSIS — Z7982 Long term (current) use of aspirin: Secondary | ICD-10-CM | POA: Insufficient documentation

## 2014-11-03 MED ORDER — KETOROLAC TROMETHAMINE 60 MG/2ML IM SOLN
30.0000 mg | Freq: Once | INTRAMUSCULAR | Status: AC
  Administered 2014-11-03: 30 mg via INTRAMUSCULAR
  Filled 2014-11-03: qty 2

## 2014-11-03 MED ORDER — IPRATROPIUM-ALBUTEROL 0.5-2.5 (3) MG/3ML IN SOLN
3.0000 mL | Freq: Once | RESPIRATORY_TRACT | Status: AC
  Administered 2014-11-03: 3 mL via RESPIRATORY_TRACT
  Filled 2014-11-03: qty 3

## 2014-11-03 NOTE — ED Notes (Addendum)
Pt states he wants to go home. Staff verbalized to pt DG ordered and asked if he would be willing to stay; pt complied.

## 2014-11-03 NOTE — ED Notes (Signed)
Per EMS: pt c/ pain with inspiration, has hx of COPD. Pt asking for pain meds with EMS. Pt does not appear SOB, pt NAD. Pt wanted to make sure he did not have pneumonia.

## 2014-11-03 NOTE — ED Notes (Signed)
DG on way to get back. Pt NOT seen in room. Staff searched department and lobby for pt. Pt left AMA. PA notified.

## 2014-11-03 NOTE — ED Notes (Signed)
Pt waiting at door stating chest xray is taking too long. Staff went to [facility] staff. Pt is next on the list; pt made aware.

## 2014-11-03 NOTE — ED Notes (Signed)
Reinforced to patient that he is not up for discharge. Standing at doorway. RR even/unlabored. Breathing treatment finished. Did not acknowledge my statement.
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
## 2015-07-03 DIAGNOSIS — M549 Dorsalgia, unspecified: Secondary | ICD-10-CM | POA: Diagnosis not present

## 2015-07-03 DIAGNOSIS — Z23 Encounter for immunization: Secondary | ICD-10-CM | POA: Diagnosis not present

## 2015-07-03 DIAGNOSIS — J449 Chronic obstructive pulmonary disease, unspecified: Secondary | ICD-10-CM | POA: Diagnosis not present

## 2015-07-03 DIAGNOSIS — Z0000 Encounter for general adult medical examination without abnormal findings: Secondary | ICD-10-CM | POA: Diagnosis not present

## 2015-07-03 DIAGNOSIS — Z79899 Other long term (current) drug therapy: Secondary | ICD-10-CM | POA: Diagnosis not present

## 2015-10-02 DIAGNOSIS — F17200 Nicotine dependence, unspecified, uncomplicated: Secondary | ICD-10-CM | POA: Diagnosis not present

## 2015-10-02 DIAGNOSIS — M545 Low back pain: Secondary | ICD-10-CM | POA: Diagnosis not present

## 2015-10-02 DIAGNOSIS — J449 Chronic obstructive pulmonary disease, unspecified: Secondary | ICD-10-CM | POA: Diagnosis not present

## 2015-10-09 ENCOUNTER — Emergency Department (HOSPITAL_COMMUNITY)
Admission: EM | Admit: 2015-10-09 | Discharge: 2015-10-09 | Disposition: A | Discharge status: Home / Self Care | Payer: Medicare Other | Attending: Emergency Medicine | Admitting: Emergency Medicine

## 2015-10-09 ENCOUNTER — Emergency Department (HOSPITAL_COMMUNITY): Payer: Medicare Other

## 2015-10-09 ENCOUNTER — Encounter (HOSPITAL_COMMUNITY): Payer: Self-pay | Admitting: Emergency Medicine

## 2015-10-09 DIAGNOSIS — R05 Cough: Secondary | ICD-10-CM | POA: Diagnosis not present

## 2015-10-09 DIAGNOSIS — Z8701 Personal history of pneumonia (recurrent): Secondary | ICD-10-CM | POA: Insufficient documentation

## 2015-10-09 DIAGNOSIS — F17210 Nicotine dependence, cigarettes, uncomplicated: Secondary | ICD-10-CM | POA: Diagnosis not present

## 2015-10-09 DIAGNOSIS — J449 Chronic obstructive pulmonary disease, unspecified: Secondary | ICD-10-CM | POA: Diagnosis not present

## 2015-10-09 DIAGNOSIS — G8929 Other chronic pain: Secondary | ICD-10-CM | POA: Diagnosis not present

## 2015-10-09 DIAGNOSIS — Z7982 Long term (current) use of aspirin: Secondary | ICD-10-CM | POA: Insufficient documentation

## 2015-10-09 DIAGNOSIS — J441 Chronic obstructive pulmonary disease with (acute) exacerbation: Secondary | ICD-10-CM | POA: Diagnosis not present

## 2015-10-09 DIAGNOSIS — Z8659 Personal history of other mental and behavioral disorders: Secondary | ICD-10-CM | POA: Insufficient documentation

## 2015-10-09 DIAGNOSIS — IMO0001 Reserved for inherently not codable concepts without codable children: Secondary | ICD-10-CM

## 2015-10-09 DIAGNOSIS — J40 Bronchitis, not specified as acute or chronic: Secondary | ICD-10-CM | POA: Diagnosis not present

## 2015-10-09 DIAGNOSIS — R0602 Shortness of breath: Secondary | ICD-10-CM | POA: Diagnosis not present

## 2015-10-09 LAB — CBC
HCT: 45.7 % (ref 39.0–52.0)
Hemoglobin: 15.2 g/dL (ref 13.0–17.0)
MCH: 32.8 pg (ref 26.0–34.0)
MCHC: 33.3 g/dL (ref 30.0–36.0)
MCV: 98.5 fL (ref 78.0–100.0)
PLATELETS: 288 10*3/uL (ref 150–400)
RBC: 4.64 MIL/uL (ref 4.22–5.81)
RDW: 13.3 % (ref 11.5–15.5)
WBC: 8.2 10*3/uL (ref 4.0–10.5)

## 2015-10-09 LAB — BASIC METABOLIC PANEL
Anion gap: 7 (ref 5–15)
BUN: 7 mg/dL (ref 6–20)
CO2: 30 mmol/L (ref 22–32)
CREATININE: 0.84 mg/dL (ref 0.61–1.24)
Calcium: 9 mg/dL (ref 8.9–10.3)
Chloride: 102 mmol/L (ref 101–111)
GFR calc non Af Amer: >60 mL/min (ref >60)
Glucose, Bld: 83 mg/dL (ref 65–99)
Potassium: 3.5 mmol/L (ref 3.5–5.1)
Sodium: 139 mmol/L (ref 135–145)

## 2015-10-09 LAB — HEPATIC FUNCTION PANEL
ALT: 13 U/L — AB (ref 17–63)
AST: 16 U/L (ref 15–41)
Albumin: 4.1 g/dL (ref 3.5–5.0)
Alkaline Phosphatase: 53 U/L (ref 38–126)
BILIRUBIN DIRECT: 0.2 mg/dL (ref 0.1–0.5)
BILIRUBIN INDIRECT: 0.5 mg/dL (ref 0.3–0.9)
TOTAL PROTEIN: 7.3 g/dL (ref 6.5–8.1)
Total Bilirubin: 0.7 mg/dL (ref 0.3–1.2)

## 2015-10-09 MED ORDER — ACETAMINOPHEN 325 MG PO TABS
650.0000 mg | ORAL_TABLET | Freq: Once | ORAL | Status: AC
  Administered 2015-10-09: 650 mg via ORAL
  Filled 2015-10-09: qty 2

## 2015-10-09 MED ORDER — ALBUTEROL SULFATE (2.5 MG/3ML) 0.083% IN NEBU
5.0000 mg | INHALATION_SOLUTION | Freq: Once | RESPIRATORY_TRACT | Status: DC
Start: 2015-10-09 — End: 2015-10-10
  Filled 2015-10-09: qty 6

## 2015-10-09 MED ORDER — HYDROCODONE-ACETAMINOPHEN 5-325 MG PO TABS
1.0000 | ORAL_TABLET | Freq: Once | ORAL | Status: AC
  Administered 2015-10-09: 1 via ORAL
  Filled 2015-10-09: qty 1

## 2015-10-09 MED ORDER — AZITHROMYCIN 250 MG PO TABS: ORAL_TABLET | ORAL | Status: DC

## 2015-10-09 MED ORDER — IPRATROPIUM-ALBUTEROL 0.5-2.5 (3) MG/3ML IN SOLN
3.0000 mL | Freq: Once | RESPIRATORY_TRACT | Status: AC
  Administered 2015-10-09: 3 mL via RESPIRATORY_TRACT
  Filled 2015-10-09: qty 3

## 2015-10-09 MED ORDER — SODIUM CHLORIDE 0.9 % IV BOLUS (SEPSIS)
500.0000 mL | Freq: Once | INTRAVENOUS | Status: AC
  Administered 2015-10-09: 500 mL via INTRAVENOUS

## 2015-10-09 MED ORDER — ALBUTEROL SULFATE HFA 108 (90 BASE) MCG/ACT IN AERS
2.0000 | INHALATION_SPRAY | RESPIRATORY_TRACT | Status: DC | PRN
  Administered 2015-10-09: 2 via RESPIRATORY_TRACT
  Filled 2015-10-09: qty 6.7

## 2015-10-09 MED ORDER — PREDNISONE 20 MG PO TABS: ORAL_TABLET | ORAL | Status: DC

## 2015-10-09 MED ORDER — METHYLPREDNISOLONE SODIUM SUCC 125 MG IJ SOLR
125.0000 mg | Freq: Once | INTRAMUSCULAR | Status: AC
Start: 2015-10-09 — End: 2015-10-09
  Administered 2015-10-09: 125 mg via INTRAVENOUS
  Filled 2015-10-09: qty 2

## 2015-10-09 MED ORDER — ALBUTEROL SULFATE (2.5 MG/3ML) 0.083% IN NEBU
2.5000 mg | INHALATION_SOLUTION | Freq: Once | RESPIRATORY_TRACT | Status: AC
  Administered 2015-10-09: 2.5 mg via RESPIRATORY_TRACT
  Filled 2015-10-09: qty 3

## 2015-10-09 NOTE — ED Notes (Signed)
Pt alert & oriented x4, stable gait. Patient given discharge instructions, paperwork & prescription(s). Patient  instructed to stop at the registration desk to finish any additional paperwork. Patient verbalized understanding. Pt left department w/ no further questions. 
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
## 2015-10-09 NOTE — ED Notes (Signed)
Started getting SOB about 1 hour PTA.  HX of COPD

## 2015-10-09 NOTE — Discharge Instructions (Signed)
Follow up with your md next week. °

## 2015-10-25 ENCOUNTER — Emergency Department (HOSPITAL_COMMUNITY): Payer: Medicare Other

## 2015-10-25 ENCOUNTER — Encounter (HOSPITAL_COMMUNITY): Payer: Self-pay | Admitting: *Deleted

## 2015-10-25 ENCOUNTER — Emergency Department (HOSPITAL_COMMUNITY)
Admission: EM | Admit: 2015-10-25 | Discharge: 2015-10-25 | Disposition: A | Discharge status: Home / Self Care | Payer: Medicare Other | Attending: Emergency Medicine | Admitting: Emergency Medicine

## 2015-10-25 DIAGNOSIS — Z7951 Long term (current) use of inhaled steroids: Secondary | ICD-10-CM | POA: Insufficient documentation

## 2015-10-25 DIAGNOSIS — R51 Headache: Secondary | ICD-10-CM | POA: Insufficient documentation

## 2015-10-25 DIAGNOSIS — Z79899 Other long term (current) drug therapy: Secondary | ICD-10-CM | POA: Insufficient documentation

## 2015-10-25 DIAGNOSIS — F17210 Nicotine dependence, cigarettes, uncomplicated: Secondary | ICD-10-CM | POA: Diagnosis not present

## 2015-10-25 DIAGNOSIS — G8929 Other chronic pain: Secondary | ICD-10-CM | POA: Diagnosis not present

## 2015-10-25 DIAGNOSIS — Z8701 Personal history of pneumonia (recurrent): Secondary | ICD-10-CM | POA: Diagnosis not present

## 2015-10-25 DIAGNOSIS — J449 Chronic obstructive pulmonary disease, unspecified: Secondary | ICD-10-CM

## 2015-10-25 DIAGNOSIS — J441 Chronic obstructive pulmonary disease with (acute) exacerbation: Secondary | ICD-10-CM | POA: Insufficient documentation

## 2015-10-25 DIAGNOSIS — R519 Headache, unspecified: Secondary | ICD-10-CM

## 2015-10-25 DIAGNOSIS — R0602 Shortness of breath: Secondary | ICD-10-CM | POA: Diagnosis present

## 2015-10-25 MED ORDER — ALBUTEROL SULFATE HFA 108 (90 BASE) MCG/ACT IN AERS
2.0000 | INHALATION_SPRAY | RESPIRATORY_TRACT | Status: DC | PRN
  Administered 2015-10-25: 2 via RESPIRATORY_TRACT
  Filled 2015-10-25: qty 6.7

## 2015-10-25 MED ORDER — KETOROLAC TROMETHAMINE 60 MG/2ML IM SOLN
60.0000 mg | Freq: Once | INTRAMUSCULAR | Status: AC
  Administered 2015-10-25: 60 mg via INTRAMUSCULAR
  Filled 2015-10-25: qty 2

## 2015-10-25 MED ORDER — IPRATROPIUM-ALBUTEROL 0.5-2.5 (3) MG/3ML IN SOLN
3.0000 mL | Freq: Once | RESPIRATORY_TRACT | Status: AC
  Administered 2015-10-25: 3 mL via RESPIRATORY_TRACT
  Filled 2015-10-25: qty 3

## 2015-10-25 NOTE — ED Notes (Signed)
Pt c/o sob since it got dark. Pt has used inhaler without relief. Pt also requesting pain meds for a headache.

## 2015-10-25 NOTE — Discharge Instructions (Signed)
General Headache Without Cause A headache is pain or discomfort felt around the head or neck area. The specific cause of a headache may not be found. There are many causes and types of headaches. A few common ones are:  Tension headaches.  Migraine headaches.  Cluster headaches.  Chronic daily headaches. HOME CARE INSTRUCTIONS  Watch your condition for any changes. Take these steps to help with your condition: Managing Pain  Take over-the-counter and prescription medicines only as told by your health care provider.  Lie down in a dark, quiet room when you have a headache.  If directed, apply ice to the head and neck area:  Put ice in a plastic bag.  Place a towel between your skin and the bag.  Leave the ice on for 20 minutes, 2-3 times per day.  Use a heating pad or hot shower to apply heat to the head and neck area as told by your health care provider.  Keep lights dim if bright lights bother you or make your headaches worse. Eating and Drinking  Eat meals on a regular schedule.  Limit alcohol use.  Decrease the amount of caffeine you drink, or stop drinking caffeine. General Instructions  Keep all follow-up visits as told by your health care provider. This is important.  Keep a headache journal to help find out what may trigger your headaches. For example, write down:  What you eat and drink.  How much sleep you get.  Any change to your diet or medicines.  Try massage or other relaxation techniques.  Limit stress.  Sit up straight, and do not tense your muscles.  Do not use tobacco products, including cigarettes, chewing tobacco, or e-cigarettes. If you need help quitting, ask your health care provider.  Exercise regularly as told by your health care provider.  Sleep on a regular schedule. Get 7-9 hours of sleep, or the amount recommended by your health care provider. SEEK MEDICAL CARE IF:   Your symptoms are not helped by medicine.  You have a  headache that is different from the usual headache.  You have nausea or you vomit.  You have a fever. SEEK IMMEDIATE MEDICAL CARE IF:   Your headache becomes severe.  You have repeated vomiting.  You have a stiff neck.  You have a loss of vision.  You have problems with speech.  You have pain in the eye or ear.  You have muscular weakness or loss of muscle control.  You lose your balance or have trouble walking.  You feel faint or pass out.  You have confusion.   This information is not intended to replace advice given to you by your health care provider. Make sure you discuss any questions you have with your health care provider.   Document Released: 10/03/2005 Document Revised: 06/24/2015 Document Reviewed: 01/26/2015 Elsevier Interactive Patient Education 2016 [employer]  Chronic Obstructive Pulmonary Disease Exacerbation Chronic obstructive pulmonary disease (COPD) is a common lung condition in which airflow from the lungs is limited. COPD is a general term that can be used to describe many different lung problems that limit airflow, including chronic bronchitis and emphysema. COPD exacerbations are episodes when breathing symptoms become much worse and require extra treatment. Without treatment, COPD exacerbations can be life threatening, and frequent COPD exacerbations can cause further damage to your lungs. CAUSES  Respiratory infections.  Exposure to smoke.  Exposure to air pollution, chemical fumes, or dust. Sometimes there is no apparent cause or trigger. RISK FACTORS  Smoking cigarettes.  Older age.  Frequent prior COPD exacerbations. SIGNS AND SYMPTOMS  Increased coughing.  Increased thick spit (sputum) production.  Increased wheezing.  Increased shortness of breath.  Rapid breathing.  Chest tightness. DIAGNOSIS Your medical history, a physical exam, and tests will help your health care provider make a diagnosis. Tests may include:  A  chest X-ray.  Basic lab tests.  Sputum testing.  An arterial blood gas test. TREATMENT Depending on the severity of your COPD exacerbation, you may need to be admitted to a hospital for treatment. Some of the treatments commonly used to treat COPD exacerbations are:   Antibiotic medicines.  Bronchodilators. These are drugs that expand the air passages. They may be given with an inhaler or nebulizer. Spacer devices may be needed to help improve drug delivery.  Corticosteroid medicines.  Supplemental oxygen therapy.  Airway clearing techniques, such as noninvasive ventilation (NIV) and positive expiratory pressure (PEP). These provide respiratory support through a mask or other noninvasive device. HOME CARE INSTRUCTIONS  Do not smoke. Quitting smoking is very important to prevent COPD from getting worse and exacerbations from happening as often.  Avoid exposure to all substances that irritate the airway, especially to tobacco smoke.  If you were prescribed an antibiotic medicine, finish it all even if you start to feel better.  Take all medicines as directed by your health care provider.It is important to use correct technique with inhaled medicines.  Drink enough fluids to keep your urine clear or pale yellow (unless you have a medical condition that requires fluid restriction).  Use a cool mist vaporizer. This makes it easier to clear your chest when you cough.  If you have a home nebulizer and oxygen, continue to use them as directed.  Maintain all necessary vaccinations to prevent infections.  Exercise regularly.  Eat a healthy diet.  Keep all follow-up appointments as directed by your health care provider. SEEK IMMEDIATE MEDICAL CARE IF:  You have worsening shortness of breath.  You have trouble talking.  You have severe chest pain.  You have blood in your sputum.  You have a fever.  You have weakness, vomit repeatedly, or faint.  You feel confused.  You  continue to get worse. MAKE SURE YOU:  Understand these instructions.  Will watch your condition.  Will get help right away if you are not doing well or get worse.   This information is not intended to replace advice given to you by your health care provider. Make sure you discuss any questions you have with your health care provider.   Document Released: 07/31/2007 Document Revised: 10/24/2014 Document Reviewed: 06/07/2013 Elsevier Interactive Patient Education [employer].
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
## 2015-11-12 ENCOUNTER — Encounter (HOSPITAL_COMMUNITY): Payer: Self-pay

## 2015-11-12 DIAGNOSIS — Z79899 Other long term (current) drug therapy: Secondary | ICD-10-CM | POA: Diagnosis not present

## 2015-11-12 DIAGNOSIS — Z7951 Long term (current) use of inhaled steroids: Secondary | ICD-10-CM | POA: Insufficient documentation

## 2015-11-12 DIAGNOSIS — Z8701 Personal history of pneumonia (recurrent): Secondary | ICD-10-CM | POA: Insufficient documentation

## 2015-11-12 DIAGNOSIS — J441 Chronic obstructive pulmonary disease with (acute) exacerbation: Secondary | ICD-10-CM | POA: Insufficient documentation

## 2015-11-12 DIAGNOSIS — R05 Cough: Secondary | ICD-10-CM | POA: Diagnosis present

## 2015-11-12 DIAGNOSIS — G8929 Other chronic pain: Secondary | ICD-10-CM | POA: Insufficient documentation

## 2015-11-12 DIAGNOSIS — F17210 Nicotine dependence, cigarettes, uncomplicated: Secondary | ICD-10-CM | POA: Insufficient documentation

## 2015-11-12 NOTE — ED Notes (Signed)
Pt states he feels he needs a neb treatment, states he feels more sob and has been coughing, using inhaler at home.  Pt also c/o headache.

## 2015-11-13 ENCOUNTER — Emergency Department (HOSPITAL_COMMUNITY)
Admission: EM | Admit: 2015-11-13 | Discharge: 2015-11-13 | Disposition: A | Discharge status: Home / Self Care | Payer: Medicare Other | Attending: Emergency Medicine | Admitting: Emergency Medicine

## 2015-11-13 DIAGNOSIS — J441 Chronic obstructive pulmonary disease with (acute) exacerbation: Secondary | ICD-10-CM | POA: Diagnosis not present

## 2015-11-13 MED ORDER — IPRATROPIUM-ALBUTEROL 0.5-2.5 (3) MG/3ML IN SOLN
3.0000 mL | RESPIRATORY_TRACT | Status: AC
  Administered 2015-11-13 (×3): 3 mL via RESPIRATORY_TRACT
  Filled 2015-11-13: qty 9

## 2015-11-13 MED ORDER — ALBUTEROL SULFATE HFA 108 (90 BASE) MCG/ACT IN AERS: 2.0000 | INHALATION_SPRAY | RESPIRATORY_TRACT | Status: DC | PRN

## 2015-11-13 MED ORDER — PREDNISONE 50 MG PO TABS
60.0000 mg | ORAL_TABLET | Freq: Once | ORAL | Status: AC
  Administered 2015-11-13: 60 mg via ORAL
  Filled 2015-11-13: qty 1

## 2015-11-13 MED ORDER — PREDNISONE 20 MG PO TABS: 60.0000 mg | ORAL_TABLET | Freq: Every day | ORAL | Status: DC

## 2015-11-13 MED ORDER — KETOROLAC TROMETHAMINE 30 MG/ML IJ SOLN
60.0000 mg | Freq: Once | INTRAMUSCULAR | Status: AC
Start: 2015-11-13 — End: 2015-11-13
  Administered 2015-11-13: 60 mg via INTRAMUSCULAR
  Filled 2015-11-13: qty 2

## 2015-11-13 NOTE — ED Provider Notes (Signed)
TIME SEEN: 11:50 PM  CHIEF COMPLAINT: Cough, shortness of breath  HPI: Pt is a 55 y.o. M with an hx of COPD that presents to the ED with SOB that began earlier this evening.  Pt endorses associated cough productive of yellow mucus, wheezing, and a HA. He notes similar symptoms in the past. He has used an inhaler with slight relief. He denies an hx of DVT, PE, CAD, CHF. He denies fever and CP. Patient is a current smoker but only smokes occasionally. States that he normally gets similar headaches when he has had a COPD exacerbation. He is requesting a "shot for pain", and has a treatments. He is not currently on steroids.  ROS: See HPI Constitutional: no fever  Eyes: no drainage  ENT: no runny nose   Cardiovascular:  no chest pain  Resp:  SOB  GI: no vomiting GU: no dysuria Integumentary: no rash  Allergy: no hives  Musculoskeletal: no leg swelling  Neurological: no slurred speech ROS otherwise negative  PAST MEDICAL HISTORY/PAST SURGICAL HISTORY:  Past Medical History  Diagnosis Date  . PNA (pneumonia)   . COPD (chronic obstructive pulmonary disease) ([place])   . Chronic dental pain   . Polysubstance abuse     etoh, rx narcotics ("buy them off the street")  . Pneumonia     MEDICATIONS:  Prior to Admission medications   Medication Sig Start Date End Date Taking? Authorizing Provider  Fluticasone-Salmeterol (ADVAIR) 100-50 MCG/DOSE AEPB Inhale 1 puff into the lungs 2 (two) times daily.   Yes Historical Provider, MD  ibuprofen (ADVIL,MOTRIN) 200 MG tablet Take 200-800 mg by mouth every 6 (six) hours as needed for moderate pain.   Yes Historical Provider, MD  PROAIR HFA 108 [phone] Base) MCG/ACT inhaler Inhale 1-2 puffs into the lungs every 6 (six) hours as needed for wheezing or shortness of breath.  10/12/15  Yes Historical Provider, MD  SPIRIVA HANDIHALER 18 MCG inhalation capsule Place 18 mcg into inhaler and inhale daily.  10/12/15  Yes Historical Provider, MD  traMADol-acetaminophen  (ULTRACET) 37.5-325 MG tablet Take 1-2 tablets by mouth every 8 (eight) hours as needed for moderate pain or severe pain.  10/12/15  Yes Historical Provider, MD    ALLERGIES:  No Known Allergies  SOCIAL HISTORY:  Social History  Substance Use Topics  . Smoking status: Current Some Day Smoker -- 1.00 packs/day    Types: Cigarettes  . Smokeless tobacco: Current User    Types: Snuff  . Alcohol Use: No    FAMILY HISTORY: Family History  Problem Relation Age of Onset  . Diabetes Mother     EXAM: BP 109/75 mmHg  Pulse 85  Temp(Src) 97.8 F (36.6 C) (Oral)  Resp 20  Ht 5\' 9"  (1.753 m)  Wt 197 lb (89.359 kg)  BMI 29.08 kg/m2  SpO2 100% CONSTITUTIONAL: Alert and oriented and responds appropriately to questions. Chronically ill-appearing, smells strongly of cigarettes, disheveled appearing HEAD: Normocephalic EYES: Conjunctivae clear, PERRL ENT: normal nose; no rhinorrhea; moist mucous membranes; pharynx without lesions noted NECK: Supple, no meningismus, no LAD  CARD: RRR; S1 and S2 appreciated; no murmurs, no clicks, no rubs, no gallops RESP: Normal chest excursion without splinting or tachypnea; breath sounds equal bilaterally but he does have scattered wheezing, no rhonchi or rales, no hypoxia or respiratory distress, speaking full sentences ABD/GI: Normal bowel sounds; non-distended; soft, non-tender, no rebound, no guarding BACK:  The back appears normal and is non-tender to palpation, there is no CVA tenderness EXT:  Normal ROM in all joints; non-tender to palpation; no edema; normal capillary refill; no cyanosis    SKIN: Normal color for age and race; warm NEURO: Moves all extremities equally; sensation to light touch intact diffusely, cranial nerves II through XII intact, normal gait PSYCH: The patient's mood and manner are appropriate. Grooming and personal hygiene are appropriate.  MEDICAL DECISION MAKING: Patient here with wheezing, shortness of breath. He states he  does have a cough with yellow sputum production. Reports is chronic. No chest pain or fever. No lower extreme swelling or pain. He is requesting nebulizer treatment for his wheezing. Feels that this is a COPD exacerbation. He is not in any distress. We'll give duo nebs, prednisone. As for his headache he has had similar headaches before. No fever, meningismus, nuchal rigidity or neurologic deficits. Will give dose of IM Toradol. I do not feel he needs head imaging at this time. Low suspicion for infectious etiology, intracranial hemorrhage. Have offered patient a chest x-ray given his shortness of breath to rule out pneumothorax, infiltrate, edema but patient refuses.  ED PROGRESS: 1:10 AM  Pt reports feeling much better after 2 duo nebs. His lungs are now clear to auscultation with good aeration. I feel he is safe to be discharged him. We'll discharge with prescriptions for albuterol inhaler, prednisone burst. Have instructed him to alternate Tylenol and ibuprofen for pain. Discussed return precautions and recommended he quit smoking. He verbalizes understanding and is comfortable with this plan.    I personally performed the services described in this documentation, which was scribed in my presence. The recorded information has been reviewed and is accurate.     [place], DO 11/13/15 [phone]

## 2015-11-13 NOTE — Discharge Instructions (Signed)
Chronic Obstructive Pulmonary Disease Exacerbation °Chronic obstructive pulmonary disease (COPD) is a common lung condition in which airflow from the lungs is limited. COPD is a general term that can be used to describe many different lung problems that limit airflow, including chronic bronchitis and emphysema. COPD exacerbations are episodes when breathing symptoms become much worse and require extra treatment. Without treatment, COPD exacerbations can be life threatening, and frequent COPD exacerbations can cause further damage to your lungs. °CAUSES °· Respiratory infections. °· Exposure to smoke. °· Exposure to air pollution, chemical fumes, or dust. °Sometimes there is no apparent cause or trigger. °RISK FACTORS °· Smoking cigarettes. °· Older age. °· Frequent prior COPD exacerbations. °SIGNS AND SYMPTOMS °· Increased coughing. °· Increased thick spit (sputum) production. °· Increased wheezing. °· Increased shortness of breath. °· Rapid breathing. °· Chest tightness. °DIAGNOSIS °Your medical history, a physical exam, and tests will help your health care provider make a diagnosis. Tests may include: °· A chest X-ray. °· Basic lab tests. °· Sputum testing. °· An arterial blood gas test. °TREATMENT °Depending on the severity of your COPD exacerbation, you may need to be admitted to a hospital for treatment. Some of the treatments commonly used to treat COPD exacerbations are:  °· Antibiotic medicines. °· Bronchodilators. These are drugs that expand the air passages. They may be given with an inhaler or nebulizer. Spacer devices may be needed to help improve drug delivery. °· Corticosteroid medicines. °· Supplemental oxygen therapy. °· Airway clearing techniques, such as noninvasive ventilation (NIV) and positive expiratory pressure (PEP). These provide respiratory support through a mask or other noninvasive device. °HOME CARE INSTRUCTIONS °· Do not smoke. Quitting smoking is very important to prevent COPD from  getting worse and exacerbations from happening as often. °· Avoid exposure to all substances that irritate the airway, especially to tobacco smoke. °· If you were prescribed an antibiotic medicine, finish it all even if you start to feel better. °· Take all medicines as directed by your health care provider. It is important to use correct technique with inhaled medicines. °· Drink enough fluids to keep your urine clear or pale yellow (unless you have a medical condition that requires fluid restriction). °· Use a cool mist vaporizer. This makes it easier to clear your chest when you cough. °· If you have a home nebulizer and oxygen, continue to use them as directed. °· Maintain all necessary vaccinations to prevent infections. °· Exercise regularly. °· Eat a healthy diet. °· Keep all follow-up appointments as directed by your health care provider. °SEEK IMMEDIATE MEDICAL CARE IF: °· You have worsening shortness of breath. °· You have trouble talking. °· You have severe chest pain. °· You have blood in your sputum. °· You have a fever. °· You have weakness, vomit repeatedly, or faint. °· You feel confused. °· You continue to get worse. °MAKE SURE YOU: °· Understand these instructions. °· Will watch your condition. °· Will get help right away if you are not doing well or get worse. °  °This information is not intended to replace advice given to you by your health care provider. Make sure you discuss any questions you have with your health care provider. °  °Document Released: 07/31/2007 Document Revised: 10/24/2014 Document Reviewed: 06/07/2013 °Elsevier Interactive Patient Education ©2016 Elsevier Inc. ° ° °Smoking Cessation, Tips for Success °If you are ready to quit smoking, congratulations! You have chosen to help yourself be healthier. Cigarettes bring nicotine, tar, carbon monoxide, and other irritants into your body.   Your lungs, heart, and blood vessels will be able to work better without these poisons. There  are many different ways to quit smoking. Nicotine gum, nicotine patches, a nicotine inhaler, or nicotine nasal spray can help with physical craving. Hypnosis, support groups, and medicines help break the habit of smoking. °WHAT THINGS CAN I DO TO MAKE QUITTING EASIER?  °Here are some tips to help you quit for good: °· Pick a date when you will quit smoking completely. Tell all of your friends and family about your plan to quit on that date. °· Do not try to slowly cut down on the number of cigarettes you are smoking. Pick a quit date and quit smoking completely starting on that day. °· Throw away all cigarettes.   °· Clean and remove all ashtrays from your home, work, and car. °· On a card, write down your reasons for quitting. Carry the card with you and read it when you get the urge to smoke. °· Cleanse your body of nicotine. Drink enough water and fluids to keep your urine clear or pale yellow. Do this after quitting to flush the nicotine from your body. °· Learn to predict your moods. Do not let a bad situation be your excuse to have a cigarette. Some situations in your life might tempt you into wanting a cigarette. °· Never have "just one" cigarette. It leads to wanting another and another. Remind yourself of your decision to quit. °· Change habits associated with smoking. If you smoked while driving or when feeling stressed, try other activities to replace smoking. Stand up when drinking your coffee. Brush your teeth after eating. Sit in a different chair when you read the paper. Avoid alcohol while trying to quit, and try to drink fewer caffeinated beverages. Alcohol and caffeine may urge you to smoke. °· Avoid foods and drinks that can trigger a desire to smoke, such as sugary or spicy foods and alcohol. °· Ask people who smoke not to smoke around you. °· Have something planned to do right after eating or having a cup of coffee. For example, plan to take a walk or exercise. °· Try a relaxation exercise to  calm you down and decrease your stress. Remember, you may be tense and nervous for the first 2 weeks after you quit, but this will pass. °· Find new activities to keep your hands busy. Play with a pen, coin, or rubber band. Doodle or draw things on paper. °· Brush your teeth right after eating. This will help cut down on the craving for the taste of tobacco after meals. You can also try mouthwash.   °· Use oral substitutes in place of cigarettes. Try using lemon drops, carrots, cinnamon sticks, or chewing gum. Keep them handy so they are available when you have the urge to smoke. °· When you have the urge to smoke, try deep breathing. °· Designate your home as a nonsmoking area. °· If you are a heavy smoker, ask your health care provider about a prescription for nicotine chewing gum. It can ease your withdrawal from nicotine. °· Reward yourself. Set aside the cigarette money you save and buy yourself something nice. °· Look for support from others. Join a support group or smoking cessation program. Ask someone at home or at work to help you with your plan to quit smoking. °· Always ask yourself, "Do I need this cigarette or is this just a reflex?" Tell yourself, "Today, I choose not to smoke," or "I do not want to   smoke." You are reminding yourself of your decision to quit. °· Do not replace cigarette smoking with electronic cigarettes (commonly called e-cigarettes). The safety of e-cigarettes is unknown, and some may contain harmful chemicals. °· If you relapse, do not give up! Plan ahead and think about what you will do the next time you get the urge to smoke. °HOW WILL I FEEL WHEN I QUIT SMOKING? °You may have symptoms of withdrawal because your body is used to nicotine (the addictive substance in cigarettes). You may crave cigarettes, be irritable, feel very hungry, cough often, get headaches, or have difficulty concentrating. The withdrawal symptoms are only temporary. They are strongest when you first quit but  will go away within 10-14 days. When withdrawal symptoms occur, stay in control. Think about your reasons for quitting. Remind yourself that these are signs that your body is healing and getting used to being without cigarettes. Remember that withdrawal symptoms are easier to treat than the major diseases that smoking can cause.  °Even after the withdrawal is over, expect periodic urges to smoke. However, these cravings are generally short lived and will go away whether you smoke or not. Do not smoke! °WHAT RESOURCES ARE AVAILABLE TO HELP ME QUIT SMOKING? °Your health care provider can direct you to community resources or hospitals for support, which may include: °· Group support. °· Education. °· Hypnosis. °· Therapy. °  °This information is not intended to replace advice given to you by your health care provider. Make sure you discuss any questions you have with your health care provider. °  °Document Released: 07/01/2004 Document Revised: 10/24/2014 Document Reviewed: 03/21/2013 °Elsevier Interactive Patient Education ©2016 Elsevier Inc. ° °

## 2015-12-01 DIAGNOSIS — Z139 Encounter for screening, unspecified: Secondary | ICD-10-CM

## 2015-12-03 ENCOUNTER — Emergency Department (HOSPITAL_COMMUNITY): Payer: Medicare Other

## 2015-12-03 ENCOUNTER — Emergency Department (HOSPITAL_COMMUNITY)
Admission: EM | Admit: 2015-12-03 | Discharge: 2015-12-03 | Disposition: A | Discharge status: Home / Self Care | Payer: Medicare Other | Attending: Emergency Medicine | Admitting: Emergency Medicine

## 2015-12-03 ENCOUNTER — Encounter (HOSPITAL_COMMUNITY): Payer: Self-pay | Admitting: Emergency Medicine

## 2015-12-03 DIAGNOSIS — J441 Chronic obstructive pulmonary disease with (acute) exacerbation: Secondary | ICD-10-CM | POA: Diagnosis not present

## 2015-12-03 DIAGNOSIS — R05 Cough: Secondary | ICD-10-CM | POA: Diagnosis not present

## 2015-12-03 DIAGNOSIS — J159 Unspecified bacterial pneumonia: Secondary | ICD-10-CM | POA: Diagnosis not present

## 2015-12-03 DIAGNOSIS — J189 Pneumonia, unspecified organism: Secondary | ICD-10-CM

## 2015-12-03 DIAGNOSIS — Z7952 Long term (current) use of systemic steroids: Secondary | ICD-10-CM | POA: Diagnosis not present

## 2015-12-03 DIAGNOSIS — Z139 Encounter for screening, unspecified: Secondary | ICD-10-CM

## 2015-12-03 DIAGNOSIS — Z7951 Long term (current) use of inhaled steroids: Secondary | ICD-10-CM | POA: Insufficient documentation

## 2015-12-03 DIAGNOSIS — F17210 Nicotine dependence, cigarettes, uncomplicated: Secondary | ICD-10-CM | POA: Diagnosis not present

## 2015-12-03 DIAGNOSIS — R079 Chest pain, unspecified: Secondary | ICD-10-CM | POA: Diagnosis present

## 2015-12-03 DIAGNOSIS — G8929 Other chronic pain: Secondary | ICD-10-CM | POA: Insufficient documentation

## 2015-12-03 DIAGNOSIS — M549 Dorsalgia, unspecified: Secondary | ICD-10-CM | POA: Diagnosis not present

## 2015-12-03 DIAGNOSIS — R52 Pain, unspecified: Secondary | ICD-10-CM | POA: Diagnosis not present

## 2015-12-03 MED ORDER — MAGNESIUM SULFATE 2 GM/50ML IV SOLN
2.0000 g | Freq: Once | INTRAVENOUS | Status: AC
  Administered 2015-12-03: 2 g via INTRAVENOUS
  Filled 2015-12-03: qty 50

## 2015-12-03 MED ORDER — IPRATROPIUM BROMIDE 0.02 % IN SOLN
1.0000 mg | Freq: Once | RESPIRATORY_TRACT | Status: AC
  Administered 2015-12-03: 1 mg via RESPIRATORY_TRACT
  Filled 2015-12-03: qty 5

## 2015-12-03 MED ORDER — AZITHROMYCIN 250 MG PO TABS
500.0000 mg | ORAL_TABLET | Freq: Once | ORAL | Status: AC
  Administered 2015-12-03: 500 mg via ORAL
  Filled 2015-12-03: qty 2

## 2015-12-03 MED ORDER — KETOROLAC TROMETHAMINE 30 MG/ML IJ SOLN
30.0000 mg | Freq: Once | INTRAMUSCULAR | Status: AC
  Administered 2015-12-03: 30 mg via INTRAVENOUS
  Filled 2015-12-03: qty 1

## 2015-12-03 MED ORDER — ALBUTEROL (5 MG/ML) CONTINUOUS INHALATION SOLN
10.0000 mg/h | INHALATION_SOLUTION | RESPIRATORY_TRACT | Status: DC
  Administered 2015-12-03: 10 mg/h via RESPIRATORY_TRACT
  Filled 2015-12-03: qty 20

## 2015-12-03 MED ORDER — AZITHROMYCIN 250 MG PO TABS: 250.0000 mg | ORAL_TABLET | Freq: Every day | ORAL | Status: DC

## 2015-12-03 MED ORDER — PREDNISONE 20 MG PO TABS
60.0000 mg | ORAL_TABLET | Freq: Once | ORAL | Status: AC
  Administered 2015-12-03: 60 mg via ORAL
  Filled 2015-12-03: qty 3

## 2015-12-03 MED ORDER — PREDNISONE 20 MG PO TABS: 60.0000 mg | ORAL_TABLET | Freq: Every day | ORAL | Status: DC

## 2015-12-03 NOTE — Discharge Instructions (Signed)
Community-Acquired Pneumonia, Adult Mr. Dales, your chest xray shows pneumonia.  Take antibiotics as directed and see a primary care doctor within 3 days for close follow up.  Take steroids as well to prevent your COPD attack from coming back. If any symptoms worsen, come back to the ED immediately. Thank you.   Chest xray IMPRESSION: Rounded area of infiltration in the right lung laterally likely pneumonia. Followup PA and lateral chest X-ray is recommended in 3-4 weeks following trial of antibiotic therapy to ensure resolution and exclude underlying malignancy.   Pneumonia is an infection of the lungs. One type of pneumonia can happen while a person is in a hospital. A different type can happen when a person is not in a hospital (community-acquired pneumonia). It is easy for this kind to spread from person to person. It can spread to you if you breathe near an infected person who coughs or sneezes. Some symptoms include:  A dry cough.  A wet (productive) cough.  Fever.  Sweating.  Chest pain. HOME CARE  Take over-the-counter and prescription medicines only as told by your doctor.  Only take cough medicine if you are losing sleep.  If you were prescribed an antibiotic medicine, take it as told by your doctor. Do not stop taking the antibiotic even if you start to feel better.  Sleep with your head and neck raised (elevated). You can do this by putting a few pillows under your head, or you can sleep in a recliner.  Do not use tobacco products. These include cigarettes, chewing tobacco, and e-cigarettes. If you need help quitting, ask your doctor.  Drink enough water to keep your pee (urine) clear or pale yellow. A shot (vaccine) can help prevent pneumonia. Shots are often suggested for:  People older than 54 years of age.  People older than 54 years of age:  Who are having cancer treatment.  Who have long-term (chronic) lung disease.  Who have problems with their body's  defense system (immune system). You may also prevent pneumonia if you take these actions:  Get the flu (influenza) shot every year.  Go to the dentist as often as told.  Wash your hands often. If soap and water are not available, use hand sanitizer. GET HELP IF:  You have a fever.  You lose sleep because your cough medicine does not help. GET HELP RIGHT AWAY IF:  You are short of breath and it gets worse.  You have more chest pain.  Your sickness gets worse. This is very serious if:  You are an older adult.  Your body's defense system is weak.  You cough up blood.   This information is not intended to replace advice given to you by your health care provider. Make sure you discuss any questions you have with your health care provider.   Document Released: 03/21/2008 Document Revised: 06/24/2015 Document Reviewed: 01/28/2015 Elsevier Interactive Patient Education [employer].
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
## 2015-12-03 NOTE — ED Notes (Signed)
Patient transported to X-ray 

## 2015-12-03 NOTE — ED Notes (Signed)
Pt brought to ED by [facility] from [facility] c/o 7/10 right upper back pain going to his right chest, denies fever, chills, nausea or vomiting. Pt has Hx of COPD. VS 122/78, HR 72, R 16, SPO2 99% RA.

## 2015-12-08 NOTE — Congregational Nurse Program (Signed)
Congregational Nurse Program Note  Date of Encounter: 12/03/2015  Past Medical History: Past Medical History  Diagnosis Date  . PNA (pneumonia)   . COPD (chronic obstructive pulmonary disease) ([place])   . Chronic dental pain   . Polysubstance abuse     etoh, rx narcotics ("buy them off the street")  . Pneumonia     Encounter Details:     CNP Questionnaire - 12/03/15 1418    Patient Demographics   Is this a new or existing patient? Existing   Patient is considered a/an Not Applicable   Race Caucasian/White   Patient Assistance   Location of Patient Assistance Not Applicable   Patient's financial/insurance status Medicaid   Uninsured Patient No   Patient referred to apply for the following financial assistance Not Applicable   Food insecurities addressed Provided food supplies   Transportation assistance No   Assistance securing medications No   Educational health offerings Acute disease   Encounter Details   Primary purpose of visit Education/Health Concerns;Acute Illness/Condition Visit   Was an Emergency Department visit averted? Not Applicable   Does patient have a medical provider? Yes   Patient referred to Not Applicable   Was a mental health screening completed? (GAINS tool) No   Does patient have dental issues? No   Does patient have vision issues? No   Since previous encounter, have you referred patient for abnormal blood pressure that resulted in a new diagnosis or medication change? No   Since previous encounter, have you referred patient for abnormal blood glucose that resulted in a new diagnosis or medication change? No   For Abstraction Use Only   Does patient have insurance? Yes     States was seen in the ED for pneumonia.  Discussed medications, teaching about how to take them.  Discussed need for increased fluids and rest

## 2015-12-08 NOTE — Congregational Nurse Program (Signed)
Congregational Nurse Program Note  Date of Encounter: 12/01/2015  Past Medical History: Past Medical History  Diagnosis Date  . PNA (pneumonia)   . COPD (chronic obstructive pulmonary disease) ([place])   . Chronic dental pain   . Polysubstance abuse     etoh, rx narcotics ("buy them off the street")  . Pneumonia     Encounter Details:     CNP Questionnaire - 12/01/15 1413    Patient Demographics   Is this a new or existing patient? New   Patient is considered a/an Not Applicable   Race Caucasian/White   Patient Assistance   Location of Patient Assistance Not Applicable   Patient's financial/insurance status Medicaid   Uninsured Patient No   Patient referred to apply for the following financial assistance Not Applicable   Food insecurities addressed Provided food supplies   Transportation assistance No   Assistance securing medications No   Educational health offerings Hypertension   Encounter Details   Primary purpose of visit Education/Health Concerns   Was an Emergency Department visit averted? Not Applicable   Does patient have a medical provider? Yes   Patient referred to Not Applicable   Was a mental health screening completed? (GAINS tool) No   Does patient have dental issues? No   Does patient have vision issues? No   Since previous encounter, have you referred patient for abnormal blood pressure that resulted in a new diagnosis or medication change? No   Since previous encounter, have you referred patient for abnormal blood glucose that resulted in a new diagnosis or medication change? No   For Abstraction Use Only   Does patient have insurance? Yes     screening for B/P  124/68

## 2016-01-07 ENCOUNTER — Encounter (HOSPITAL_COMMUNITY): Payer: Self-pay | Admitting: Emergency Medicine

## 2016-01-07 ENCOUNTER — Emergency Department (HOSPITAL_COMMUNITY)
Admission: EM | Admit: 2016-01-07 | Discharge: 2016-01-07 | Discharge status: Against Medical Advice | Payer: Medicare Other | Attending: Emergency Medicine | Admitting: Emergency Medicine

## 2016-01-07 ENCOUNTER — Emergency Department (HOSPITAL_COMMUNITY): Payer: Medicare Other

## 2016-01-07 DIAGNOSIS — Z8701 Personal history of pneumonia (recurrent): Secondary | ICD-10-CM | POA: Insufficient documentation

## 2016-01-07 DIAGNOSIS — F17210 Nicotine dependence, cigarettes, uncomplicated: Secondary | ICD-10-CM | POA: Insufficient documentation

## 2016-01-07 DIAGNOSIS — J441 Chronic obstructive pulmonary disease with (acute) exacerbation: Secondary | ICD-10-CM | POA: Diagnosis not present

## 2016-01-07 DIAGNOSIS — R05 Cough: Secondary | ICD-10-CM | POA: Diagnosis present

## 2016-01-07 LAB — BASIC METABOLIC PANEL
ANION GAP: 7 (ref 5–15)
BUN: 10 mg/dL (ref 6–20)
CALCIUM: 8.2 mg/dL — AB (ref 8.9–10.3)
CO2: 27 mmol/L (ref 22–32)
Chloride: 101 mmol/L (ref 101–111)
Creatinine, Ser: 0.66 mg/dL (ref 0.61–1.24)
Glucose, Bld: 109 mg/dL — ABNORMAL HIGH (ref 65–99)
POTASSIUM: 3.5 mmol/L (ref 3.5–5.1)
Sodium: 135 mmol/L (ref 135–145)

## 2016-01-07 LAB — CBC WITH DIFFERENTIAL/PLATELET
BASOS ABS: 0 10*3/uL (ref 0.0–0.1)
BASOS PCT: 0 %
EOS ABS: 0.2 10*3/uL (ref 0.0–0.7)
Eosinophils Relative: 2 %
HCT: 42.8 % (ref 39.0–52.0)
HEMOGLOBIN: 14.3 g/dL (ref 13.0–17.0)
Lymphocytes Relative: 12 %
Lymphs Abs: 1.5 10*3/uL (ref 0.7–4.0)
MCH: 31.8 pg (ref 26.0–34.0)
MCHC: 33.4 g/dL (ref 30.0–36.0)
MCV: 95.3 fL (ref 78.0–100.0)
Monocytes Absolute: 0.7 10*3/uL (ref 0.1–1.0)
Monocytes Relative: 6 %
NEUTROS PCT: 80 %
Neutro Abs: 10.2 10*3/uL — ABNORMAL HIGH (ref 1.7–7.7)
Platelets: 235 10*3/uL (ref 150–400)
RBC: 4.49 MIL/uL (ref 4.22–5.81)
RDW: 12.8 % (ref 11.5–15.5)
WBC: 12.6 10*3/uL — AB (ref 4.0–10.5)

## 2016-01-07 LAB — TROPONIN I: Troponin I: <0.03 ng/mL (ref <0.031)

## 2016-01-07 MED ORDER — OXYCODONE-ACETAMINOPHEN 5-325 MG PO TABS
1.0000 | ORAL_TABLET | Freq: Once | ORAL | Status: AC
  Administered 2016-01-07: 1 via ORAL
  Filled 2016-01-07: qty 1

## 2016-01-07 MED ORDER — KETOROLAC TROMETHAMINE 30 MG/ML IJ SOLN
30.0000 mg | Freq: Once | INTRAMUSCULAR | Status: AC
  Administered 2016-01-07: 30 mg via INTRAVENOUS
  Filled 2016-01-07: qty 1

## 2016-01-07 MED ORDER — SODIUM CHLORIDE 0.9 % IV SOLN: INTRAVENOUS | Status: DC

## 2016-01-07 MED ORDER — ALBUTEROL (5 MG/ML) CONTINUOUS INHALATION SOLN
10.0000 mg/h | INHALATION_SOLUTION | Freq: Once | RESPIRATORY_TRACT | Status: AC
  Administered 2016-01-07: 10 mg/h via RESPIRATORY_TRACT
  Filled 2016-01-07: qty 20

## 2016-01-07 MED ORDER — SODIUM CHLORIDE 0.9 % IV BOLUS (SEPSIS)
1000.0000 mL | Freq: Once | INTRAVENOUS | Status: AC
  Administered 2016-01-07: 1000 mL via INTRAVENOUS

## 2016-01-07 MED ORDER — METHYLPREDNISOLONE SODIUM SUCC 125 MG IJ SOLR
125.0000 mg | Freq: Once | INTRAMUSCULAR | Status: DC
  Filled 2016-01-07: qty 2

## 2016-01-07 NOTE — ED Notes (Signed)
RN in entered room to give solumedrol. Patient not in room. IV found running in floor, catheter intact.
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
## 2016-01-07 NOTE — ED Notes (Addendum)
Pt reports that "i feel like i have the pneumonia."  Pt is coughing up yellow sputum, has weakness.

## 2016-02-15 DIAGNOSIS — F17200 Nicotine dependence, unspecified, uncomplicated: Secondary | ICD-10-CM | POA: Diagnosis not present

## 2016-02-15 DIAGNOSIS — R5383 Other fatigue: Secondary | ICD-10-CM | POA: Diagnosis not present

## 2016-02-15 DIAGNOSIS — J449 Chronic obstructive pulmonary disease, unspecified: Secondary | ICD-10-CM | POA: Diagnosis not present

## 2016-05-09 ENCOUNTER — Encounter (HOSPITAL_COMMUNITY): Payer: Self-pay | Admitting: Emergency Medicine

## 2016-05-09 ENCOUNTER — Emergency Department (HOSPITAL_COMMUNITY)
Admission: EM | Admit: 2016-05-09 | Discharge: 2016-05-10 | Disposition: A | Discharge status: Home / Self Care | Payer: Medicare Other | Attending: Emergency Medicine | Admitting: Emergency Medicine

## 2016-05-09 DIAGNOSIS — Z79899 Other long term (current) drug therapy: Secondary | ICD-10-CM | POA: Diagnosis not present

## 2016-05-09 DIAGNOSIS — F17210 Nicotine dependence, cigarettes, uncomplicated: Secondary | ICD-10-CM | POA: Diagnosis not present

## 2016-05-09 DIAGNOSIS — R0602 Shortness of breath: Secondary | ICD-10-CM | POA: Diagnosis not present

## 2016-05-09 DIAGNOSIS — R51 Headache: Secondary | ICD-10-CM | POA: Diagnosis not present

## 2016-05-09 DIAGNOSIS — J441 Chronic obstructive pulmonary disease with (acute) exacerbation: Secondary | ICD-10-CM

## 2016-05-09 LAB — CBC
HCT: 46.7 % (ref 39.0–52.0)
Hemoglobin: 15.6 g/dL (ref 13.0–17.0)
MCH: 32.8 pg (ref 26.0–34.0)
MCHC: 33.4 g/dL (ref 30.0–36.0)
MCV: 98.1 fL (ref 78.0–100.0)
PLATELETS: 246 10*3/uL (ref 150–400)
RBC: 4.76 MIL/uL (ref 4.22–5.81)
RDW: 12.9 % (ref 11.5–15.5)
WBC: 10.3 10*3/uL (ref 4.0–10.5)

## 2016-05-09 LAB — BASIC METABOLIC PANEL
Anion gap: 4 — ABNORMAL LOW (ref 5–15)
BUN: 15 mg/dL (ref 6–20)
CHLORIDE: 110 mmol/L (ref 101–111)
CO2: 24 mmol/L (ref 22–32)
Calcium: 8.3 mg/dL — ABNORMAL LOW (ref 8.9–10.3)
Creatinine, Ser: 0.82 mg/dL (ref 0.61–1.24)
GFR calc Af Amer: >60 mL/min (ref >60)
GLUCOSE: 88 mg/dL (ref 65–99)
POTASSIUM: 3.8 mmol/L (ref 3.5–5.1)
Sodium: 138 mmol/L (ref 135–145)

## 2016-05-09 MED ORDER — ALBUTEROL SULFATE (2.5 MG/3ML) 0.083% IN NEBU: 5.0000 mg | INHALATION_SOLUTION | Freq: Once | RESPIRATORY_TRACT | Status: DC

## 2016-05-09 MED ORDER — ALBUTEROL SULFATE (2.5 MG/3ML) 0.083% IN NEBU
2.5000 mg | INHALATION_SOLUTION | Freq: Once | RESPIRATORY_TRACT | Status: AC
  Administered 2016-05-09: 2.5 mg via RESPIRATORY_TRACT
  Filled 2016-05-09: qty 3

## 2016-05-09 MED ORDER — ACETAMINOPHEN 500 MG PO TABS
1000.0000 mg | ORAL_TABLET | Freq: Once | ORAL | Status: AC
  Administered 2016-05-09: 1000 mg via ORAL
  Filled 2016-05-09: qty 2

## 2016-05-09 MED ORDER — ALBUTEROL SULFATE (2.5 MG/3ML) 0.083% IN NEBU
5.0000 mg | INHALATION_SOLUTION | Freq: Once | RESPIRATORY_TRACT | Status: AC
  Administered 2016-05-09: 5 mg via RESPIRATORY_TRACT
  Filled 2016-05-09: qty 6

## 2016-05-09 MED ORDER — IPRATROPIUM-ALBUTEROL 0.5-2.5 (3) MG/3ML IN SOLN
3.0000 mL | Freq: Once | RESPIRATORY_TRACT | Status: AC
  Administered 2016-05-09: 3 mL via RESPIRATORY_TRACT
  Filled 2016-05-09: qty 3

## 2016-05-09 MED ORDER — IPRATROPIUM BROMIDE 0.02 % IN SOLN: 0.5000 mg | Freq: Once | RESPIRATORY_TRACT | Status: DC

## 2016-05-09 NOTE — ED Notes (Signed)
Patient states SOB started today at 1500 denies pain

## 2016-05-09 NOTE — ED Triage Notes (Signed)
Pt c/o increased sob since 1500. Pt states he has copd and has been living in his car in the heat.
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
## 2016-05-10 MED ORDER — ALBUTEROL SULFATE HFA 108 (90 BASE) MCG/ACT IN AERS
2.0000 | INHALATION_SPRAY | Freq: Four times a day (QID) | RESPIRATORY_TRACT | Status: DC | PRN
  Filled 2016-05-10: qty 6.7

## 2016-05-10 MED ORDER — AEROCHAMBER Z-STAT PLUS/MEDIUM MISC: 1.0000 | Freq: Once | Status: DC

## 2016-05-10 MED ORDER — ALBUTEROL SULFATE HFA 108 (90 BASE) MCG/ACT IN AERS: 2.0000 | INHALATION_SPRAY | RESPIRATORY_TRACT | 0 refills | Status: DC | PRN

## 2016-05-10 MED ORDER — PREDNISONE 20 MG PO TABS: ORAL_TABLET | ORAL | 0 refills | Status: DC

## 2016-05-10 NOTE — Discharge Instructions (Signed)
Use the inhaler as needed for shortness of breath or wheezing. Take the prednisone until gone  Recheck if you get a fever, struggle to breathe despite using the inhaler or seem worse.

## 2016-05-10 NOTE — ED Notes (Signed)
This nurse in room to discharge pt and pt not found; blood found on counter and floor, IV found in trash;

## 2016-05-16 ENCOUNTER — Emergency Department (HOSPITAL_COMMUNITY): Payer: Medicare Other

## 2016-05-16 ENCOUNTER — Emergency Department (HOSPITAL_COMMUNITY)
Admission: EM | Admit: 2016-05-16 | Discharge: 2016-05-16 | Disposition: A | Discharge status: Home / Self Care | Payer: Medicare Other | Attending: Emergency Medicine | Admitting: Emergency Medicine

## 2016-05-16 ENCOUNTER — Encounter (HOSPITAL_COMMUNITY): Payer: Self-pay | Admitting: Emergency Medicine

## 2016-05-16 DIAGNOSIS — F17210 Nicotine dependence, cigarettes, uncomplicated: Secondary | ICD-10-CM | POA: Diagnosis not present

## 2016-05-16 DIAGNOSIS — R05 Cough: Secondary | ICD-10-CM | POA: Diagnosis not present

## 2016-05-16 DIAGNOSIS — J189 Pneumonia, unspecified organism: Secondary | ICD-10-CM | POA: Diagnosis not present

## 2016-05-16 DIAGNOSIS — J449 Chronic obstructive pulmonary disease, unspecified: Secondary | ICD-10-CM | POA: Insufficient documentation

## 2016-05-16 DIAGNOSIS — R062 Wheezing: Secondary | ICD-10-CM | POA: Diagnosis present

## 2016-05-16 LAB — CBC WITH DIFFERENTIAL/PLATELET
Basophils Absolute: 0 10*3/uL (ref 0.0–0.1)
Basophils Relative: 0 %
EOS ABS: 0.3 10*3/uL (ref 0.0–0.7)
EOS PCT: 3 %
HCT: 43.5 % (ref 39.0–52.0)
HEMOGLOBIN: 14.3 g/dL (ref 13.0–17.0)
LYMPHS ABS: 1.7 10*3/uL (ref 0.7–4.0)
LYMPHS PCT: 18 %
MCH: 31.4 pg (ref 26.0–34.0)
MCHC: 32.9 g/dL (ref 30.0–36.0)
MCV: 95.6 fL (ref 78.0–100.0)
MONOS PCT: 9 %
Monocytes Absolute: 0.9 10*3/uL (ref 0.1–1.0)
NEUTROS PCT: 70 %
Neutro Abs: 6.6 10*3/uL (ref 1.7–7.7)
Platelets: 237 10*3/uL (ref 150–400)
RBC: 4.55 MIL/uL (ref 4.22–5.81)
RDW: 12.8 % (ref 11.5–15.5)
WBC: 9.4 10*3/uL (ref 4.0–10.5)

## 2016-05-16 LAB — BASIC METABOLIC PANEL
Anion gap: 6 (ref 5–15)
BUN: 19 mg/dL (ref 6–20)
CHLORIDE: 100 mmol/L — AB (ref 101–111)
CO2: 27 mmol/L (ref 22–32)
CREATININE: 0.75 mg/dL (ref 0.61–1.24)
Calcium: 8.1 mg/dL — ABNORMAL LOW (ref 8.9–10.3)
GFR calc Af Amer: >60 mL/min (ref >60)
GFR calc non Af Amer: >60 mL/min (ref >60)
GLUCOSE: 96 mg/dL (ref 65–99)
POTASSIUM: 3.7 mmol/L (ref 3.5–5.1)
SODIUM: 133 mmol/L — AB (ref 135–145)

## 2016-05-16 MED ORDER — IBUPROFEN 800 MG PO TABS: 800.0000 mg | ORAL_TABLET | Freq: Three times a day (TID) | ORAL | 0 refills | Status: DC

## 2016-05-16 MED ORDER — ALBUTEROL SULFATE HFA 108 (90 BASE) MCG/ACT IN AERS: 2.0000 | INHALATION_SPRAY | RESPIRATORY_TRACT | 3 refills | Status: DC | PRN

## 2016-05-16 MED ORDER — PREDNISONE 20 MG PO TABS
40.0000 mg | ORAL_TABLET | Freq: Once | ORAL | Status: AC
  Administered 2016-05-16: 40 mg via ORAL
  Filled 2016-05-16: qty 2

## 2016-05-16 MED ORDER — AMOXICILLIN-POT CLAVULANATE 875-125 MG PO TABS: 1.0000 | ORAL_TABLET | Freq: Two times a day (BID) | ORAL | 0 refills | Status: DC

## 2016-05-16 MED ORDER — PREDNISONE 20 MG PO TABS: 40.0000 mg | ORAL_TABLET | Freq: Every day | ORAL | 0 refills | Status: DC

## 2016-05-16 MED ORDER — AMOXICILLIN-POT CLAVULANATE 875-125 MG PO TABS
1.0000 | ORAL_TABLET | Freq: Once | ORAL | Status: AC
  Administered 2016-05-16: 1 via ORAL
  Filled 2016-05-16: qty 1

## 2016-05-16 MED ORDER — ALBUTEROL (5 MG/ML) CONTINUOUS INHALATION SOLN
10.0000 mg/h | INHALATION_SOLUTION | RESPIRATORY_TRACT | Status: AC
  Administered 2016-05-16: 10 mg/h via RESPIRATORY_TRACT
  Filled 2016-05-16: qty 20
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
## 2016-05-16 NOTE — ED Triage Notes (Signed)
Onset yesterday cough with yellow sputum, weakness, wheezing, pt is out of neb mediatation,  Rib pain with cough, hx COPD

## 2016-05-16 NOTE — Discharge Instructions (Signed)
You have been diagnosed with a pneumonia in your right lower lung. This is what is causing the pain in your side and making you feel short of breath.  Please take medications as follows  #1 albuterol every 4 hours for the next 24 hours, then every 4 hours as needed  #2 prednisone once a day for the next 5 days  #3 Augmentin twice a day for the next 10 days  Please return to the emergency department immediately for severe or worsening pain, difficulty breathing, fever, lightheadedness or vomiting as this may indicate that your infection is worsening and could require admission to the hospital

## 2016-06-04 ENCOUNTER — Emergency Department (HOSPITAL_COMMUNITY): Payer: Medicare Other

## 2016-06-04 ENCOUNTER — Encounter (HOSPITAL_COMMUNITY): Payer: Self-pay | Admitting: *Deleted

## 2016-06-04 DIAGNOSIS — Z79899 Other long term (current) drug therapy: Secondary | ICD-10-CM | POA: Insufficient documentation

## 2016-06-04 DIAGNOSIS — R05 Cough: Secondary | ICD-10-CM | POA: Diagnosis not present

## 2016-06-04 DIAGNOSIS — M549 Dorsalgia, unspecified: Secondary | ICD-10-CM | POA: Diagnosis present

## 2016-06-04 DIAGNOSIS — J189 Pneumonia, unspecified organism: Secondary | ICD-10-CM | POA: Insufficient documentation

## 2016-06-04 DIAGNOSIS — F17210 Nicotine dependence, cigarettes, uncomplicated: Secondary | ICD-10-CM | POA: Diagnosis not present

## 2016-06-04 DIAGNOSIS — R091 Pleurisy: Secondary | ICD-10-CM | POA: Diagnosis not present

## 2016-06-04 DIAGNOSIS — J449 Chronic obstructive pulmonary disease, unspecified: Secondary | ICD-10-CM | POA: Diagnosis not present

## 2016-06-04 NOTE — ED Triage Notes (Addendum)
Pt reports recently being treated for pneumonia and has finished his antibiotics. Pt states he is hurting in his "lungs" and in his back. Pt states he can't sleep due to the pain. Pt c/o nonproductive cough.

## 2016-06-05 ENCOUNTER — Emergency Department (HOSPITAL_COMMUNITY)
Admission: EM | Admit: 2016-06-05 | Discharge: 2016-06-05 | Disposition: A | Discharge status: Home / Self Care | Payer: Medicare Other | Attending: Emergency Medicine | Admitting: Emergency Medicine

## 2016-06-05 DIAGNOSIS — J189 Pneumonia, unspecified organism: Secondary | ICD-10-CM

## 2016-06-05 DIAGNOSIS — R091 Pleurisy: Secondary | ICD-10-CM

## 2016-06-05 LAB — BASIC METABOLIC PANEL
ANION GAP: 6 (ref 5–15)
BUN: 12 mg/dL (ref 6–20)
CHLORIDE: 107 mmol/L (ref 101–111)
CO2: 22 mmol/L (ref 22–32)
Calcium: 8.3 mg/dL — ABNORMAL LOW (ref 8.9–10.3)
Creatinine, Ser: 0.87 mg/dL (ref 0.61–1.24)
GFR calc Af Amer: >60 mL/min (ref >60)
GFR calc non Af Amer: >60 mL/min (ref >60)
GLUCOSE: 81 mg/dL (ref 65–99)
POTASSIUM: 3.2 mmol/L — AB (ref 3.5–5.1)
Sodium: 135 mmol/L (ref 135–145)

## 2016-06-05 LAB — CBC WITH DIFFERENTIAL/PLATELET
Basophils Absolute: 0 10*3/uL (ref 0.0–0.1)
Basophils Relative: 0 %
Eosinophils Absolute: 0.5 10*3/uL (ref 0.0–0.7)
Eosinophils Relative: 7 %
HEMATOCRIT: 44.9 % (ref 39.0–52.0)
HEMOGLOBIN: 15.1 g/dL (ref 13.0–17.0)
Lymphocytes Relative: 39 %
Lymphs Abs: 3.1 10*3/uL (ref 0.7–4.0)
MCH: 32 pg (ref 26.0–34.0)
MCHC: 33.6 g/dL (ref 30.0–36.0)
MCV: 95.1 fL (ref 78.0–100.0)
Monocytes Absolute: 0.5 10*3/uL (ref 0.1–1.0)
Monocytes Relative: 6 %
NEUTROS ABS: 3.8 10*3/uL (ref 1.7–7.7)
NEUTROS PCT: 48 %
Platelets: 206 10*3/uL (ref 150–400)
RBC: 4.72 MIL/uL (ref 4.22–5.81)
RDW: 12.8 % (ref 11.5–15.5)
WBC: 7.9 10*3/uL (ref 4.0–10.5)

## 2016-06-05 LAB — D-DIMER, QUANTITATIVE: D-Dimer, Quant: <0.27 ug/mL-FEU (ref 0.00–0.50)

## 2016-06-05 MED ORDER — IPRATROPIUM BROMIDE 0.02 % IN SOLN
0.5000 mg | Freq: Once | RESPIRATORY_TRACT | Status: AC
  Administered 2016-06-05: 0.5 mg via RESPIRATORY_TRACT
  Filled 2016-06-05: qty 2.5

## 2016-06-05 MED ORDER — SODIUM CHLORIDE 0.9 % IV BOLUS (SEPSIS)
1000.0000 mL | Freq: Once | INTRAVENOUS | Status: AC
  Administered 2016-06-05: 1000 mL via INTRAVENOUS

## 2016-06-05 MED ORDER — KETOROLAC TROMETHAMINE 30 MG/ML IJ SOLN
15.0000 mg | Freq: Once | INTRAMUSCULAR | Status: AC
  Administered 2016-06-05: 15 mg via INTRAMUSCULAR
  Filled 2016-06-05: qty 1

## 2016-06-05 MED ORDER — AZITHROMYCIN 250 MG PO TABS: 250.0000 mg | ORAL_TABLET | Freq: Every day | ORAL | 0 refills | Status: DC

## 2016-06-05 MED ORDER — ALBUTEROL SULFATE (2.5 MG/3ML) 0.083% IN NEBU
5.0000 mg | INHALATION_SOLUTION | Freq: Once | RESPIRATORY_TRACT | Status: AC
  Administered 2016-06-05: 5 mg via RESPIRATORY_TRACT
  Filled 2016-06-05: qty 6

## 2016-06-05 NOTE — ED Notes (Signed)
Upon ambulation, O2 dropped to 93 and heart rate jumped up to the 160s. Pt stated that he felt fine

## 2016-06-05 NOTE — ED Notes (Signed)
No answer in waiting room,  
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
## 2016-06-05 NOTE — ED Notes (Signed)
Pt alert & oriented x4, stable gait. Patient given discharge instructions, paperwork & prescription(s). Patient  instructed to stop at the registration desk to finish any additional paperwork. Patient verbalized understanding. Pt left department w/ no further questions. 

## 2016-06-27 ENCOUNTER — Encounter (HOSPITAL_COMMUNITY): Payer: Self-pay | Admitting: Emergency Medicine

## 2016-06-27 ENCOUNTER — Emergency Department (HOSPITAL_COMMUNITY)
Admission: EM | Admit: 2016-06-27 | Discharge: 2016-06-28 | Disposition: A | Discharge status: Home / Self Care | Payer: Medicare Other | Attending: Emergency Medicine | Admitting: Emergency Medicine

## 2016-06-27 DIAGNOSIS — Z79899 Other long term (current) drug therapy: Secondary | ICD-10-CM | POA: Insufficient documentation

## 2016-06-27 DIAGNOSIS — F1410 Cocaine abuse, uncomplicated: Secondary | ICD-10-CM | POA: Insufficient documentation

## 2016-06-27 DIAGNOSIS — K5900 Constipation, unspecified: Secondary | ICD-10-CM | POA: Insufficient documentation

## 2016-06-27 DIAGNOSIS — R1031 Right lower quadrant pain: Secondary | ICD-10-CM | POA: Diagnosis not present

## 2016-06-27 DIAGNOSIS — F17210 Nicotine dependence, cigarettes, uncomplicated: Secondary | ICD-10-CM | POA: Diagnosis not present

## 2016-06-27 DIAGNOSIS — J690 Pneumonitis due to inhalation of food and vomit: Secondary | ICD-10-CM | POA: Diagnosis not present

## 2016-06-27 DIAGNOSIS — R1084 Generalized abdominal pain: Secondary | ICD-10-CM | POA: Diagnosis present

## 2016-06-27 DIAGNOSIS — R1011 Right upper quadrant pain: Secondary | ICD-10-CM | POA: Diagnosis not present

## 2016-06-27 NOTE — ED Provider Notes (Signed)
TIME SEEN: 11:55 PM  CHIEF COMPLAINT: Abdominal pain  HPI: Pt is a 55 y.o. male with history of COPD, polysubstance abuse who presents to the emergency department with complaints of diffuse abdominal pain that he describes as a crampy pain that started several days ago. Has had nausea but no vomiting or diarrhea. Reports he has felt constipated but has been having bowel movements every day. States he did have a bowel movement yesterday morning in some of his pain improved. States he does not feel like he is passing gas normally. Abdomen is not distended. No fevers or chills. No dysuria or hematuria. No history of abdominal surgery. No sick contacts. Did not take anything at home prior to arrival. Has not been taking anything at home for constipation.  ROS: See HPI Constitutional: no fever  Eyes: no drainage  ENT: no runny nose   Cardiovascular:  no chest pain  Resp: no SOB  GI: no vomiting GU: no dysuria Integumentary: no rash  Allergy: no hives  Musculoskeletal: no leg swelling  Neurological: no slurred speech ROS otherwise negative  PAST MEDICAL HISTORY/PAST SURGICAL HISTORY:  Past Medical History:  Diagnosis Date  . Chronic dental pain   . COPD (chronic obstructive pulmonary disease) ([place])   . PNA (pneumonia)   . Pneumonia   . Polysubstance abuse    etoh, rx narcotics ("buy them off the street")    MEDICATIONS:  Prior to Admission medications   Medication Sig Start Date End Date Taking? Authorizing Provider  albuterol (PROVENTIL HFA;VENTOLIN HFA) 108 (90 Base) MCG/ACT inhaler Inhale 2 puffs into the lungs every 4 (four) hours as needed for wheezing or shortness of breath. 05/16/16   Noemi Chapel, MD  azithromycin (ZITHROMAX) 250 MG tablet Take 1 tablet (250 mg total) by mouth daily. Take first 2 tablets together, then 1 every day until finished. 06/05/16   Ripley Fraise, MD  Fluticasone-Salmeterol (ADVAIR) 100-50 MCG/DOSE AEPB Inhale 1 puff into the lungs 2 (two) times daily.     Historical Provider, MD  ibuprofen (ADVIL,MOTRIN) 800 MG tablet Take 1 tablet (800 mg total) by mouth 3 (three) times daily. 05/16/16   Noemi Chapel, MD    ALLERGIES:  No Known Allergies  SOCIAL HISTORY:  Social History  Substance Use Topics  . Smoking status: Current Some Day Smoker    Packs/day: 1.00    Types: Cigarettes  . Smokeless tobacco: Former Systems developer    Types: Snuff  . Alcohol use No    FAMILY HISTORY: Family History  Problem Relation Age of Onset  . Diabetes Mother     EXAM: BP 111/84   Pulse 69   Temp 97.7 F (36.5 C)   Resp 18   Ht 5\' 9"  (1.753 m)   Wt 188 lb (85.3 kg)   SpO2 96%   BMI 27.76 kg/m  CONSTITUTIONAL: Alert and oriented and responds appropriately to questions. Chronically ill appearing, nontoxic and afebrile; well-nourished HEAD: Normocephalic EYES: Conjunctivae clear, PERRL ENT: normal nose; no rhinorrhea; moist mucous membranes, poor dentition NECK: Supple, no meningismus, no LAD  CARD: RRR; S1 and S2 appreciated; no murmurs, no clicks, no rubs, no gallops RESP: Normal chest excursion without splinting or tachypnea; breath sounds clear and equal bilaterally; no wheezes, no rhonchi, no rales, no hypoxia or respiratory distress, speaking full sentences ABD/GI: Normal bowel sounds; non-distended; soft, tender throughout the right upper and lower quadrants with intermittent voluntary guarding, no rebound, negative Murphy sign, no peritoneal signs BACK:  The back appears normal and  is non-tender to palpation, there is no CVA tenderness EXT: Normal ROM in all joints; non-tender to palpation; no edema; normal capillary refill; no cyanosis, no calf tenderness or swelling    SKIN: Normal color for age and race; warm; no rash NEURO: Moves all extremities equally, sensation to light touch intact diffusely, cranial nerves II through XII intact PSYCH: The patient's mood and manner are appropriate. Grooming and personal hygiene are appropriate.  MEDICAL  DECISION MAKING: Patient here with right-sided abdominal pain. Differential diagnosis includes constipation, bowel obstruction, pancreatitis, cholecystitis, biliary colic, appendicitis. Patient did drive himself to the emergency department and has a history of substance abuse. Will treat pain with Toradol, Bentyl. We'll give Zofran IV fluids. Will obtain labs, urine and a CT of his abdomen and pelvis.  ED PROGRESS: 2:30 AM  Pt's labs are unremarkable. CT scan shows no abnormality of the abdomen or pelvis but he does appear to have right sided aspiration pneumonia which could be causing his right upper quadrant pain. His lungs today are clear to auscultation but he is here frequently for COPD. I feel he is safe to be discharged home on Augmentin and with an albuterol inhaler. Have recommended MiraLAX and Colace twice daily for constipation. Will also discharge with Bentyl and Zofran as needed for pain and nausea.  4:00 AM  Pt has been resting comfortably and is hemodynamically stable. No hypoxia, respiratory distress, increased work of breathing. No coughing. He has had no vomiting or diarrhea in the emergency department. Pain has improved after above medications. Urine shows no sign of infection but he is cocaine positive. Given outpatient substance abuse resources. Discharged on Augmentin for his aspiration pneumonia. Given a refill of his albuterol inhaler. Discharge with Bentyl and Zofran to use as needed. He has a PCP for follow-up.   At this time, I do not feel there is any life-threatening condition present. I have reviewed and discussed all results (EKG, imaging, lab, urine as appropriate), exam findings with patient/family. I have reviewed nursing notes and appropriate previous records.  I feel the patient is safe to be discharged home without further emergent workup and can continue workup as an outpatient as needed. Discussed usual and customary return precautions. Patient/family verbalize  understanding and are comfortable with this plan.  Outpatient follow-up has been provided. All questions have been answered.    [place], DO 06/28/16 [phone]

## 2016-06-27 NOTE — ED Triage Notes (Signed)
Pt c/o abd pain for a few days with nausea. Pt states he was constipated earlier today, but finally had a bowel movement.

## 2016-06-28 ENCOUNTER — Emergency Department (HOSPITAL_COMMUNITY): Payer: Medicare Other

## 2016-06-28 DIAGNOSIS — R1031 Right lower quadrant pain: Secondary | ICD-10-CM | POA: Diagnosis not present

## 2016-06-28 DIAGNOSIS — J690 Pneumonitis due to inhalation of food and vomit: Secondary | ICD-10-CM | POA: Diagnosis not present

## 2016-06-28 DIAGNOSIS — R1011 Right upper quadrant pain: Secondary | ICD-10-CM | POA: Diagnosis not present

## 2016-06-28 LAB — COMPREHENSIVE METABOLIC PANEL
ALBUMIN: 4.5 g/dL (ref 3.5–5.0)
ALK PHOS: 55 U/L (ref 38–126)
ALT: 12 U/L — ABNORMAL LOW (ref 17–63)
ANION GAP: 9 (ref 5–15)
AST: 14 U/L — ABNORMAL LOW (ref 15–41)
BUN: 10 mg/dL (ref 6–20)
CALCIUM: 9.3 mg/dL (ref 8.9–10.3)
CO2: 27 mmol/L (ref 22–32)
Chloride: 100 mmol/L — ABNORMAL LOW (ref 101–111)
Creatinine, Ser: 0.89 mg/dL (ref 0.61–1.24)
GFR calc non Af Amer: >60 mL/min (ref >60)
GLUCOSE: 97 mg/dL (ref 65–99)
POTASSIUM: 3.9 mmol/L (ref 3.5–5.1)
SODIUM: 136 mmol/L (ref 135–145)
TOTAL PROTEIN: 7.8 g/dL (ref 6.5–8.1)
Total Bilirubin: 0.9 mg/dL (ref 0.3–1.2)

## 2016-06-28 LAB — RAPID URINE DRUG SCREEN, HOSP PERFORMED
Amphetamines: NOT DETECTED
BARBITURATES: NOT DETECTED
BENZODIAZEPINES: NOT DETECTED
COCAINE: POSITIVE — AB
OPIATES: NOT DETECTED
Tetrahydrocannabinol: NOT DETECTED

## 2016-06-28 LAB — CBC WITH DIFFERENTIAL/PLATELET
BASOS PCT: 0 %
Basophils Absolute: 0 10*3/uL (ref 0.0–0.1)
EOS ABS: 0.4 10*3/uL (ref 0.0–0.7)
EOS PCT: 4 %
HCT: 47.2 % (ref 39.0–52.0)
Hemoglobin: 15.9 g/dL (ref 13.0–17.0)
LYMPHS ABS: 2.7 10*3/uL (ref 0.7–4.0)
Lymphocytes Relative: 30 %
MCH: 32.4 pg (ref 26.0–34.0)
MCHC: 33.7 g/dL (ref 30.0–36.0)
MCV: 96.1 fL (ref 78.0–100.0)
MONO ABS: 0.6 10*3/uL (ref 0.1–1.0)
MONOS PCT: 7 %
Neutro Abs: 5.3 10*3/uL (ref 1.7–7.7)
Neutrophils Relative %: 59 %
Platelets: 206 10*3/uL (ref 150–400)
RBC: 4.91 MIL/uL (ref 4.22–5.81)
RDW: 13.1 % (ref 11.5–15.5)
WBC: 9.1 10*3/uL (ref 4.0–10.5)

## 2016-06-28 LAB — URINALYSIS, ROUTINE W REFLEX MICROSCOPIC
BILIRUBIN URINE: NEGATIVE
Glucose, UA: NEGATIVE mg/dL
Hgb urine dipstick: NEGATIVE
KETONES UR: NEGATIVE mg/dL
LEUKOCYTES UA: NEGATIVE
NITRITE: NEGATIVE
PH: 6.5 (ref 5.0–8.0)
Protein, ur: NEGATIVE mg/dL

## 2016-06-28 LAB — LIPASE, BLOOD: Lipase: 23 U/L (ref 11–51)

## 2016-06-28 MED ORDER — ALBUTEROL SULFATE HFA 108 (90 BASE) MCG/ACT IN AERS: 2.0000 | INHALATION_SPRAY | RESPIRATORY_TRACT | 0 refills | Status: DC | PRN

## 2016-06-28 MED ORDER — POLYETHYLENE GLYCOL 3350 17 G PO PACK: 17.0000 g | PACK | Freq: Two times a day (BID) | ORAL | 0 refills | Status: DC

## 2016-06-28 MED ORDER — ONDANSETRON HCL 4 MG/2ML IJ SOLN
4.0000 mg | Freq: Once | INTRAMUSCULAR | Status: AC
  Administered 2016-06-28: 4 mg via INTRAVENOUS
  Filled 2016-06-28: qty 2

## 2016-06-28 MED ORDER — AMOXICILLIN-POT CLAVULANATE 875-125 MG PO TABS: 1.0000 | ORAL_TABLET | Freq: Two times a day (BID) | ORAL | 0 refills | Status: DC

## 2016-06-28 MED ORDER — IOPAMIDOL (ISOVUE-300) INJECTION 61%
100.0000 mL | Freq: Once | INTRAVENOUS | Status: AC | PRN
  Administered 2016-06-28: 100 mL via INTRAVENOUS

## 2016-06-28 MED ORDER — DICYCLOMINE HCL 20 MG PO TABS: 20.0000 mg | ORAL_TABLET | Freq: Two times a day (BID) | ORAL | 0 refills | Status: DC

## 2016-06-28 MED ORDER — KETOROLAC TROMETHAMINE 30 MG/ML IJ SOLN
30.0000 mg | Freq: Once | INTRAMUSCULAR | Status: AC
  Administered 2016-06-28: 30 mg via INTRAVENOUS
  Filled 2016-06-28: qty 1

## 2016-06-28 MED ORDER — DOCUSATE SODIUM 100 MG PO CAPS: 100.0000 mg | ORAL_CAPSULE | Freq: Two times a day (BID) | ORAL | 0 refills | Status: DC

## 2016-06-28 MED ORDER — AMOXICILLIN-POT CLAVULANATE 875-125 MG PO TABS
1.0000 | ORAL_TABLET | Freq: Once | ORAL | Status: AC
  Administered 2016-06-28: 1 via ORAL
  Filled 2016-06-28: qty 1

## 2016-06-28 MED ORDER — IOPAMIDOL (ISOVUE-300) INJECTION 61%
INTRAVENOUS | Status: AC
  Filled 2016-06-28: qty 30

## 2016-06-28 MED ORDER — DICYCLOMINE HCL 10 MG/ML IM SOLN
20.0000 mg | Freq: Once | INTRAMUSCULAR | Status: AC
  Administered 2016-06-28: 20 mg via INTRAMUSCULAR
  Filled 2016-06-28: qty 2

## 2016-06-28 MED ORDER — ONDANSETRON 4 MG PO TBDP: 4.0000 mg | ORAL_TABLET | Freq: Three times a day (TID) | ORAL | 0 refills | Status: DC | PRN

## 2016-06-28 MED ORDER — SODIUM CHLORIDE 0.9 % IV BOLUS (SEPSIS)
1000.0000 mL | Freq: Once | INTRAVENOUS | Status: AC
Start: 2016-06-28 — End: 2016-06-28
  Administered 2016-06-28: 1000 mL via INTRAVENOUS

## 2016-06-28 NOTE — ED Notes (Signed)
Pt back from CT- explained that the Dr has ordered an in/out cath if unable to provide urine sample. Pt states, "oh no, your not going to do that".  Informed pt that he did have the right to refuse, but we did need a urine sample. Asked the pt nicely to please provide a sample when he could and small amount would suffice. Pt verbalized understanding.

## 2016-06-28 NOTE — ED Notes (Signed)
Pt given a sprite zero  per request

## 2016-06-28 NOTE — ED Notes (Signed)
Pt transported to CT ?

## 2016-07-28 ENCOUNTER — Emergency Department (HOSPITAL_COMMUNITY)
Admission: EM | Admit: 2016-07-28 | Discharge: 2016-07-28 | Disposition: A | Discharge status: Home / Self Care | Payer: Medicare Other | Attending: Emergency Medicine | Admitting: Emergency Medicine

## 2016-07-28 ENCOUNTER — Encounter (HOSPITAL_COMMUNITY): Payer: Self-pay | Admitting: Emergency Medicine

## 2016-07-28 DIAGNOSIS — Z791 Long term (current) use of non-steroidal anti-inflammatories (NSAID): Secondary | ICD-10-CM | POA: Insufficient documentation

## 2016-07-28 DIAGNOSIS — F17210 Nicotine dependence, cigarettes, uncomplicated: Secondary | ICD-10-CM | POA: Diagnosis not present

## 2016-07-28 DIAGNOSIS — J449 Chronic obstructive pulmonary disease, unspecified: Secondary | ICD-10-CM | POA: Diagnosis not present

## 2016-07-28 DIAGNOSIS — F419 Anxiety disorder, unspecified: Secondary | ICD-10-CM | POA: Diagnosis not present

## 2016-07-28 MED ORDER — HYDROXYZINE HCL 25 MG PO TABS: 25.0000 mg | ORAL_TABLET | Freq: Four times a day (QID) | ORAL | 0 refills | Status: DC | PRN

## 2016-07-28 MED ORDER — HYDROXYZINE HCL 25 MG PO TABS
50.0000 mg | ORAL_TABLET | Freq: Once | ORAL | Status: AC
  Administered 2016-07-28: 50 mg via ORAL
  Filled 2016-07-28: qty 2

## 2016-07-28 MED ORDER — HYDROXYZINE HCL 25 MG PO TABS: 25.0000 mg | ORAL_TABLET | Freq: Once | ORAL | Status: DC

## 2016-07-28 NOTE — ED Triage Notes (Signed)
Pt c/o anxiety, states he is homeless and has a lot of problems. Pt denies SI/HI.
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
## 2016-07-29 ENCOUNTER — Encounter (HOSPITAL_COMMUNITY): Payer: Self-pay

## 2016-07-29 DIAGNOSIS — F17210 Nicotine dependence, cigarettes, uncomplicated: Secondary | ICD-10-CM | POA: Diagnosis not present

## 2016-07-29 DIAGNOSIS — Z79899 Other long term (current) drug therapy: Secondary | ICD-10-CM | POA: Insufficient documentation

## 2016-07-29 DIAGNOSIS — J449 Chronic obstructive pulmonary disease, unspecified: Secondary | ICD-10-CM | POA: Insufficient documentation

## 2016-07-29 DIAGNOSIS — F1410 Cocaine abuse, uncomplicated: Secondary | ICD-10-CM | POA: Diagnosis not present

## 2016-07-29 NOTE — ED Triage Notes (Signed)
Pt was seen here last night for anxiety, states he is returning tonight because he 'wants to get off of cocaine and street drugs"  Pt admits to taking some pills as well, oxycodone, and occasionally etoh.  Pt admits to using cocaine today.  Pt states he is homeless and is on disability

## 2016-07-30 ENCOUNTER — Emergency Department (HOSPITAL_COMMUNITY)
Admission: EM | Admit: 2016-07-30 | Discharge: 2016-07-30 | Disposition: A | Discharge status: Home / Self Care | Payer: Medicare Other | Attending: Emergency Medicine | Admitting: Emergency Medicine

## 2016-07-30 DIAGNOSIS — F1410 Cocaine abuse, uncomplicated: Secondary | ICD-10-CM

## 2016-07-30 LAB — COMPREHENSIVE METABOLIC PANEL
ALT: 13 U/L — AB (ref 17–63)
AST: 18 U/L (ref 15–41)
Albumin: 4 g/dL (ref 3.5–5.0)
Alkaline Phosphatase: 44 U/L (ref 38–126)
Anion gap: 8 (ref 5–15)
BILIRUBIN TOTAL: 0.4 mg/dL (ref 0.3–1.2)
BUN: 11 mg/dL (ref 6–20)
CALCIUM: 8.7 mg/dL — AB (ref 8.9–10.3)
CO2: 24 mmol/L (ref 22–32)
CREATININE: 0.96 mg/dL (ref 0.61–1.24)
Chloride: 105 mmol/L (ref 101–111)
GFR calc Af Amer: >60 mL/min (ref >60)
Glucose, Bld: 84 mg/dL (ref 65–99)
Potassium: 3.9 mmol/L (ref 3.5–5.1)
Sodium: 137 mmol/L (ref 135–145)
TOTAL PROTEIN: 7 g/dL (ref 6.5–8.1)

## 2016-07-30 LAB — CBC WITH DIFFERENTIAL/PLATELET
BASOS ABS: 0 10*3/uL (ref 0.0–0.1)
Basophils Relative: 0 %
Eosinophils Absolute: 0.4 10*3/uL (ref 0.0–0.7)
Eosinophils Relative: 4 %
HEMATOCRIT: 43.7 % (ref 39.0–52.0)
Hemoglobin: 14.7 g/dL (ref 13.0–17.0)
LYMPHS ABS: 2.4 10*3/uL (ref 0.7–4.0)
LYMPHS PCT: 28 %
MCH: 32.2 pg (ref 26.0–34.0)
MCHC: 33.6 g/dL (ref 30.0–36.0)
MCV: 95.8 fL (ref 78.0–100.0)
MONO ABS: 0.5 10*3/uL (ref 0.1–1.0)
Monocytes Relative: 6 %
NEUTROS ABS: 5.3 10*3/uL (ref 1.7–7.7)
Neutrophils Relative %: 62 %
Platelets: 214 10*3/uL (ref 150–400)
RBC: 4.56 MIL/uL (ref 4.22–5.81)
RDW: 13.2 % (ref 11.5–15.5)
WBC: 8.6 10*3/uL (ref 4.0–10.5)

## 2016-07-30 LAB — RAPID URINE DRUG SCREEN, HOSP PERFORMED
Amphetamines: NOT DETECTED
Barbiturates: NOT DETECTED
Benzodiazepines: NOT DETECTED
Cocaine: POSITIVE — AB
Opiates: NOT DETECTED
Tetrahydrocannabinol: NOT DETECTED

## 2016-07-30 LAB — ETHANOL: ALCOHOL ETHYL (B): 37 mg/dL — AB (ref <5)

## 2016-07-30 NOTE — ED Notes (Signed)
Pt upset about being discharged. Pt was angry with this RN while she was in the room. Unable to obtain vital signs due to safety concerns.
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
## 2016-07-30 NOTE — ED Notes (Signed)
Pt still sleeping...

## 2016-07-30 NOTE — ED Notes (Signed)
Pt woke up stating , "hey, what happened to my coke?" Mind you, patient has been asleep for several hours. This RN advised pt that he had been asleep and I would go get him a coke after I finished taking care of my patient in room 16. While I was in room 16, this pt went to the nurses station complaining about not getting a coke or a blanket. This RN again advised pt that I would get those things in just a minute. Hassan Rowan, RN charge nurse gave pt a coke and a warm blanket. Pt is to be discharged.

## 2016-07-30 NOTE — ED Notes (Signed)
Pt asleep and snoring at this time.

## 2016-07-30 NOTE — ED Notes (Signed)
Pt was given a sheet for drug programs for people who are trying to get off of drugs. Pt states "I can't read this, I don't have my glasses." Pt the said "I just wasted my time coming up here basically." This RN spoke to this pt and advised him about our outpatient drug programs.

## 2016-07-30 NOTE — Discharge Instructions (Signed)
You need to follow up with the resource guide you were given in the ED tonight to help with your cocaine problem.

## 2016-08-29 ENCOUNTER — Emergency Department (HOSPITAL_COMMUNITY)
Admission: EM | Admit: 2016-08-29 | Discharge: 2016-08-29 | Disposition: A | Discharge status: Home / Self Care | Payer: Medicare Other | Attending: Emergency Medicine | Admitting: Emergency Medicine

## 2016-08-29 ENCOUNTER — Emergency Department (HOSPITAL_COMMUNITY): Payer: Medicare Other

## 2016-08-29 ENCOUNTER — Encounter (HOSPITAL_COMMUNITY): Payer: Self-pay | Admitting: Emergency Medicine

## 2016-08-29 DIAGNOSIS — J449 Chronic obstructive pulmonary disease, unspecified: Secondary | ICD-10-CM | POA: Diagnosis not present

## 2016-08-29 DIAGNOSIS — R0682 Tachypnea, not elsewhere classified: Secondary | ICD-10-CM | POA: Diagnosis not present

## 2016-08-29 DIAGNOSIS — F17210 Nicotine dependence, cigarettes, uncomplicated: Secondary | ICD-10-CM | POA: Diagnosis not present

## 2016-08-29 DIAGNOSIS — R0602 Shortness of breath: Secondary | ICD-10-CM | POA: Diagnosis present

## 2016-08-29 DIAGNOSIS — J441 Chronic obstructive pulmonary disease with (acute) exacerbation: Secondary | ICD-10-CM | POA: Insufficient documentation

## 2016-08-29 DIAGNOSIS — R062 Wheezing: Secondary | ICD-10-CM | POA: Diagnosis not present

## 2016-08-29 MED ORDER — FLUTICASONE-SALMETEROL 100-50 MCG/DOSE IN AEPB: 1.0000 | INHALATION_SPRAY | Freq: Two times a day (BID) | RESPIRATORY_TRACT | 0 refills | Status: DC

## 2016-08-29 MED ORDER — PREDNISONE 20 MG PO TABS: 40.0000 mg | ORAL_TABLET | Freq: Every day | ORAL | 0 refills | Status: AC

## 2016-08-29 MED ORDER — ALBUTEROL SULFATE HFA 108 (90 BASE) MCG/ACT IN AERS
2.0000 | INHALATION_SPRAY | Freq: Once | RESPIRATORY_TRACT | Status: AC
  Administered 2016-08-29: 2 via RESPIRATORY_TRACT
  Filled 2016-08-29: qty 6.7

## 2016-08-29 MED ORDER — ALBUTEROL SULFATE HFA 108 (90 BASE) MCG/ACT IN AERS: 1.0000 | INHALATION_SPRAY | Freq: Four times a day (QID) | RESPIRATORY_TRACT | 0 refills | Status: DC | PRN

## 2016-08-29 NOTE — ED Triage Notes (Signed)
Pt reports shortness of breath x4 days with worsening today. Pt reports has been without advair x4 days. Pt denies pain. Pt alert and able to speak clear complete sentences. Per EMS, pt given duo neb and 125 solu-medrol. nad noted.

## 2016-08-29 NOTE — Discharge Instructions (Signed)
We believe that your symptoms are caused today by an exacerbation of your COPD, and possibly bronchitis.  Please take the prescribed medications and any medications that you have at home for your COPD.  Follow up with your doctor as recommended.  If you develop any new or worsening symptoms, including but not limited to fever, persistent vomiting, worsening shortness of breath, or other symptoms that concern you, please return to the Emergency Department immediately. ° ° °Chronic Obstructive Pulmonary Disease °Chronic obstructive pulmonary disease (COPD) is a common lung condition in which airflow from the lungs is limited. COPD is a general term that can be used to describe many different lung problems that limit airflow, including both chronic bronchitis and emphysema.  If you have COPD, your lung function will probably never return to normal, but there are measures you can take to improve lung function and make yourself feel better.  °CAUSES  °Smoking (common).   °Exposure to secondhand smoke.   °Genetic problems. °Chronic inflammatory lung diseases or recurrent infections. °SYMPTOMS  °Shortness of breath, especially with physical activity.   °Deep, persistent (chronic) cough with a large amount of thick mucus.   °Wheezing.   °Rapid breaths (tachypnea).   °Gray or bluish discoloration (cyanosis) of the skin, especially in fingers, toes, or lips.   °Fatigue.   °Weight loss.   °Frequent infections or episodes when breathing symptoms become much worse (exacerbations).   °Chest tightness. °DIAGNOSIS  °Your health care provider will take a medical history and perform a physical examination to make the initial diagnosis.  Additional tests for COPD may include:  °Lung (pulmonary) function tests. °Chest X-ray. °CT scan. °Blood tests. °TREATMENT  °Treatment available to help you feel better when you have COPD includes:  °Inhaler and nebulizer medicines. These help manage the symptoms of COPD and make your breathing more  comfortable. °Supplemental oxygen. Supplemental oxygen is only helpful if you have a low oxygen level in your blood.   °Exercise and physical activity. These are beneficial for nearly all people with COPD. Some people may also benefit from a pulmonary rehabilitation program. °HOME CARE INSTRUCTIONS  °Take all medicines (inhaled or pills) as directed by your health care provider. °Avoid over-the-counter medicines or cough syrups that dry up your airway (such as antihistamines) and slow down the elimination of secretions unless instructed otherwise by your health care provider.   °If you are a smoker, the most important thing that you can do is stop smoking. Continuing to smoke will cause further lung damage and breathing trouble. Ask your health care provider for help with quitting smoking. He or she can direct you to community resources or hospitals that provide support. °Avoid exposure to irritants such as smoke, chemicals, and fumes that aggravate your breathing. °Use oxygen therapy and pulmonary rehabilitation if directed by your health care provider. If you require home oxygen therapy, ask your health care provider whether you should purchase a pulse oximeter to measure your oxygen level at home.   °Avoid contact with individuals who have a contagious illness. °Avoid extreme temperature and humidity changes. °Eat healthy foods. Eating smaller, more frequent meals and resting before meals may help you maintain your strength. °Stay active, but balance activity with periods of rest. Exercise and physical activity will help you maintain your ability to do things you want to do. °Preventing infection and hospitalization is very important when you have COPD. Make sure to receive all the vaccines your health care provider recommends, especially the pneumococcal and influenza   vaccines. Ask your health care provider whether you need a pneumonia vaccine. °Learn and use relaxation techniques to manage stress. °Learn and  use controlled breathing techniques as directed by your health care provider. Controlled breathing techniques include:   °Pursed lip breathing. Start by breathing in (inhaling) through your nose for 1 second. Then, purse your lips as if you were going to whistle and breathe out (exhale) through the pursed lips for 2 seconds.   °Diaphragmatic breathing. Start by putting one hand on your abdomen just above your waist. Inhale slowly through your nose. The hand on your abdomen should move out. Then purse your lips and exhale slowly. You should be able to feel the hand on your abdomen moving in as you exhale.   °Learn and use controlled coughing to clear mucus from your lungs. Controlled coughing is a series of short, progressive coughs. The steps of controlled coughing are:   °Lean your head slightly forward.   °Breathe in deeply using diaphragmatic breathing.   °Try to hold your breath for 3 seconds.   °Keep your mouth slightly open while coughing twice.   °Spit any mucus out into a tissue.   °Rest and repeat the steps once or twice as needed. °SEEK MEDICAL CARE IF:  °You are coughing up more mucus than usual.   °There is a change in the color or thickness of your mucus.   °Your breathing is more labored than usual.   °Your breathing is faster than usual.   °SEEK IMMEDIATE MEDICAL CARE IF:  °You have shortness of breath while you are resting.   °You have shortness of breath that prevents you from: °Being able to talk.   °Performing your usual physical activities.   °You have chest pain lasting longer than 5 minutes.   °Your skin color is more cyanotic than usual. °You measure low oxygen saturations for longer than 5 minutes with a pulse oximeter. °MAKE SURE YOU:  °Understand these instructions. °Will watch your condition. °Will get help right away if you are not doing well or get worse. °Document Released: 07/13/2005 Document Revised: 02/17/2014 Document Reviewed: 05/30/2013 °[employer]® Patient Information ©2015  [employer]. This information is not intended to replace advice given to you by your health care provider. Make sure you discuss any questions you have with your health care provider. ° °
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
## 2016-08-30 DIAGNOSIS — Z23 Encounter for immunization: Secondary | ICD-10-CM | POA: Diagnosis not present

## 2016-09-08 DIAGNOSIS — R911 Solitary pulmonary nodule: Secondary | ICD-10-CM | POA: Diagnosis not present

## 2016-09-08 DIAGNOSIS — J441 Chronic obstructive pulmonary disease with (acute) exacerbation: Secondary | ICD-10-CM | POA: Diagnosis not present

## 2016-09-08 DIAGNOSIS — R0682 Tachypnea, not elsewhere classified: Secondary | ICD-10-CM | POA: Diagnosis not present

## 2016-09-08 DIAGNOSIS — R079 Chest pain, unspecified: Secondary | ICD-10-CM | POA: Diagnosis not present

## 2016-09-08 DIAGNOSIS — Z720 Tobacco use: Secondary | ICD-10-CM | POA: Diagnosis not present

## 2016-09-08 DIAGNOSIS — J189 Pneumonia, unspecified organism: Secondary | ICD-10-CM | POA: Diagnosis not present

## 2016-09-08 DIAGNOSIS — R0602 Shortness of breath: Secondary | ICD-10-CM | POA: Diagnosis not present

## 2016-09-08 DIAGNOSIS — Z87891 Personal history of nicotine dependence: Secondary | ICD-10-CM | POA: Diagnosis not present

## 2016-09-08 DIAGNOSIS — R918 Other nonspecific abnormal finding of lung field: Secondary | ICD-10-CM | POA: Diagnosis not present

## 2016-09-08 DIAGNOSIS — Z7951 Long term (current) use of inhaled steroids: Secondary | ICD-10-CM | POA: Diagnosis not present

## 2016-09-13 DIAGNOSIS — J449 Chronic obstructive pulmonary disease, unspecified: Secondary | ICD-10-CM | POA: Diagnosis not present

## 2016-09-13 DIAGNOSIS — F341 Dysthymic disorder: Secondary | ICD-10-CM | POA: Diagnosis not present

## 2016-09-13 DIAGNOSIS — G4709 Other insomnia: Secondary | ICD-10-CM | POA: Diagnosis not present

## 2016-09-13 DIAGNOSIS — F17200 Nicotine dependence, unspecified, uncomplicated: Secondary | ICD-10-CM | POA: Diagnosis not present

## 2016-10-04 DIAGNOSIS — J449 Chronic obstructive pulmonary disease, unspecified: Secondary | ICD-10-CM | POA: Diagnosis not present

## 2016-10-04 DIAGNOSIS — Z0000 Encounter for general adult medical examination without abnormal findings: Secondary | ICD-10-CM | POA: Diagnosis not present

## 2016-10-04 DIAGNOSIS — Z79899 Other long term (current) drug therapy: Secondary | ICD-10-CM | POA: Diagnosis not present

## 2016-10-04 DIAGNOSIS — G4700 Insomnia, unspecified: Secondary | ICD-10-CM | POA: Diagnosis not present

## 2016-10-04 DIAGNOSIS — M549 Dorsalgia, unspecified: Secondary | ICD-10-CM | POA: Diagnosis not present

## 2016-11-10 ENCOUNTER — Emergency Department (HOSPITAL_COMMUNITY): Payer: Medicare Other

## 2016-11-10 ENCOUNTER — Emergency Department (HOSPITAL_COMMUNITY)
Admission: EM | Admit: 2016-11-10 | Discharge: 2016-11-10 | Disposition: A | Discharge status: Home / Self Care | Payer: Medicare Other | Attending: Emergency Medicine | Admitting: Emergency Medicine

## 2016-11-10 ENCOUNTER — Encounter (HOSPITAL_COMMUNITY): Payer: Self-pay

## 2016-11-10 DIAGNOSIS — J181 Lobar pneumonia, unspecified organism: Secondary | ICD-10-CM | POA: Insufficient documentation

## 2016-11-10 DIAGNOSIS — J129 Viral pneumonia, unspecified: Secondary | ICD-10-CM

## 2016-11-10 DIAGNOSIS — Z79899 Other long term (current) drug therapy: Secondary | ICD-10-CM | POA: Insufficient documentation

## 2016-11-10 DIAGNOSIS — J441 Chronic obstructive pulmonary disease with (acute) exacerbation: Secondary | ICD-10-CM | POA: Diagnosis not present

## 2016-11-10 DIAGNOSIS — J1100 Influenza due to unidentified influenza virus with unspecified type of pneumonia: Secondary | ICD-10-CM

## 2016-11-10 DIAGNOSIS — R061 Stridor: Secondary | ICD-10-CM | POA: Diagnosis not present

## 2016-11-10 DIAGNOSIS — R404 Transient alteration of awareness: Secondary | ICD-10-CM | POA: Diagnosis not present

## 2016-11-10 DIAGNOSIS — R05 Cough: Secondary | ICD-10-CM

## 2016-11-10 DIAGNOSIS — R791 Abnormal coagulation profile: Secondary | ICD-10-CM | POA: Insufficient documentation

## 2016-11-10 DIAGNOSIS — F17210 Nicotine dependence, cigarettes, uncomplicated: Secondary | ICD-10-CM | POA: Insufficient documentation

## 2016-11-10 DIAGNOSIS — J189 Pneumonia, unspecified organism: Secondary | ICD-10-CM | POA: Diagnosis not present

## 2016-11-10 DIAGNOSIS — R062 Wheezing: Secondary | ICD-10-CM | POA: Diagnosis present

## 2016-11-10 DIAGNOSIS — R531 Weakness: Secondary | ICD-10-CM | POA: Diagnosis not present

## 2016-11-10 DIAGNOSIS — R058 Other specified cough: Secondary | ICD-10-CM

## 2016-11-10 DIAGNOSIS — R42 Dizziness and giddiness: Secondary | ICD-10-CM | POA: Diagnosis not present

## 2016-11-10 LAB — COMPREHENSIVE METABOLIC PANEL
ALBUMIN: 4.2 g/dL (ref 3.5–5.0)
ALK PHOS: 47 U/L (ref 38–126)
ALT: 14 U/L — AB (ref 17–63)
ANION GAP: 10 (ref 5–15)
AST: 22 U/L (ref 15–41)
BILIRUBIN TOTAL: 0.9 mg/dL (ref 0.3–1.2)
BUN: 19 mg/dL (ref 6–20)
CALCIUM: 8.6 mg/dL — AB (ref 8.9–10.3)
CO2: 27 mmol/L (ref 22–32)
CREATININE: 1.11 mg/dL (ref 0.61–1.24)
Chloride: 97 mmol/L — ABNORMAL LOW (ref 101–111)
GFR calc Af Amer: >60 mL/min (ref >60)
GFR calc non Af Amer: >60 mL/min (ref >60)
GLUCOSE: 109 mg/dL — AB (ref 65–99)
Potassium: 3.8 mmol/L (ref 3.5–5.1)
Sodium: 134 mmol/L — ABNORMAL LOW (ref 135–145)
TOTAL PROTEIN: 8 g/dL (ref 6.5–8.1)

## 2016-11-10 LAB — INFLUENZA PANEL BY PCR (TYPE A & B)
Influenza A By PCR: POSITIVE — AB
Influenza B By PCR: NEGATIVE

## 2016-11-10 LAB — CBC WITH DIFFERENTIAL/PLATELET
BASOS PCT: 0 %
Basophils Absolute: 0 10*3/uL (ref 0.0–0.1)
EOS PCT: 0 %
Eosinophils Absolute: 0 10*3/uL (ref 0.0–0.7)
HCT: 48.4 % (ref 39.0–52.0)
Hemoglobin: 15.9 g/dL (ref 13.0–17.0)
Lymphocytes Relative: 10 %
Lymphs Abs: 0.9 10*3/uL (ref 0.7–4.0)
MCH: 31.8 pg (ref 26.0–34.0)
MCHC: 32.9 g/dL (ref 30.0–36.0)
MCV: 96.8 fL (ref 78.0–100.0)
MONOS PCT: 8 %
Monocytes Absolute: 0.7 10*3/uL (ref 0.1–1.0)
NEUTROS PCT: 82 %
Neutro Abs: 7.5 10*3/uL (ref 1.7–7.7)
PLATELETS: 217 10*3/uL (ref 150–400)
RBC: 5 MIL/uL (ref 4.22–5.81)
RDW: 13.4 % (ref 11.5–15.5)
WBC: 9.1 10*3/uL (ref 4.0–10.5)

## 2016-11-10 LAB — EXPECTORATED SPUTUM ASSESSMENT W GRAM STAIN, RFLX TO RESP C

## 2016-11-10 LAB — I-STAT CG4 LACTIC ACID, ED: Lactic Acid, Venous: 1.94 mmol/L (ref 0.5–1.9)

## 2016-11-10 LAB — PROTIME-INR
INR: 1.07
PROTHROMBIN TIME: 13.9 s (ref 11.4–15.2)

## 2016-11-10 LAB — EXPECTORATED SPUTUM ASSESSMENT W REFEX TO RESP CULTURE

## 2016-11-10 MED ORDER — METHYLPREDNISOLONE SODIUM SUCC 125 MG IJ SOLR
125.0000 mg | Freq: Once | INTRAMUSCULAR | Status: AC
  Administered 2016-11-10: 125 mg via INTRAVENOUS
  Filled 2016-11-10: qty 2

## 2016-11-10 MED ORDER — ACETAMINOPHEN 500 MG PO TABS
1000.0000 mg | ORAL_TABLET | Freq: Once | ORAL | Status: AC
  Administered 2016-11-10: 1000 mg via ORAL
  Filled 2016-11-10: qty 2

## 2016-11-10 MED ORDER — LOPERAMIDE HCL 2 MG PO CAPS: 4.0000 mg | ORAL_CAPSULE | Freq: Four times a day (QID) | ORAL | 0 refills | Status: DC | PRN

## 2016-11-10 MED ORDER — SODIUM CHLORIDE 0.9 % IV BOLUS (SEPSIS)
1000.0000 mL | Freq: Once | INTRAVENOUS | Status: AC
  Administered 2016-11-10: 1000 mL via INTRAVENOUS

## 2016-11-10 MED ORDER — OSELTAMIVIR PHOSPHATE 75 MG PO CAPS: 75.0000 mg | ORAL_CAPSULE | Freq: Two times a day (BID) | ORAL | 0 refills | Status: DC

## 2016-11-10 MED ORDER — IPRATROPIUM BROMIDE HFA 17 MCG/ACT IN AERS
2.0000 | INHALATION_SPRAY | Freq: Once | RESPIRATORY_TRACT | Status: DC
  Filled 2016-11-10: qty 12.9

## 2016-11-10 MED ORDER — ALBUTEROL (5 MG/ML) CONTINUOUS INHALATION SOLN
10.0000 mg/h | INHALATION_SOLUTION | Freq: Once | RESPIRATORY_TRACT | Status: AC
  Administered 2016-11-10: 10 mg/h via RESPIRATORY_TRACT
  Filled 2016-11-10: qty 20

## 2016-11-10 MED ORDER — LEVOFLOXACIN IN D5W 750 MG/150ML IV SOLN
750.0000 mg | Freq: Once | INTRAVENOUS | Status: AC
  Administered 2016-11-10: 750 mg via INTRAVENOUS
  Filled 2016-11-10: qty 150

## 2016-11-10 MED ORDER — LOPERAMIDE HCL 2 MG PO CAPS
4.0000 mg | ORAL_CAPSULE | Freq: Once | ORAL | Status: AC
  Administered 2016-11-10: 4 mg via ORAL
  Filled 2016-11-10: qty 2

## 2016-11-10 MED ORDER — LEVOFLOXACIN 750 MG PO TABS: 750.0000 mg | ORAL_TABLET | Freq: Every day | ORAL | 0 refills | Status: DC

## 2016-11-10 MED ORDER — IBUPROFEN 400 MG PO TABS
400.0000 mg | ORAL_TABLET | Freq: Once | ORAL | Status: AC
  Administered 2016-11-10: 400 mg via ORAL
  Filled 2016-11-10: qty 1

## 2016-11-10 MED ORDER — SODIUM CHLORIDE 0.9 % IV SOLN
INTRAVENOUS | Status: DC
  Administered 2016-11-10: 10:00:00 via INTRAVENOUS

## 2016-11-10 MED ORDER — OSELTAMIVIR PHOSPHATE 75 MG PO CAPS
75.0000 mg | ORAL_CAPSULE | Freq: Two times a day (BID) | ORAL | Status: DC
  Administered 2016-11-10: 75 mg via ORAL
  Filled 2016-11-10: qty 1

## 2016-11-10 MED ORDER — IPRATROPIUM BROMIDE 0.02 % IN SOLN
0.5000 mg | Freq: Once | RESPIRATORY_TRACT | Status: AC
  Administered 2016-11-10: 0.5 mg via RESPIRATORY_TRACT
  Filled 2016-11-10: qty 2.5

## 2016-11-10 MED ORDER — IPRATROPIUM BROMIDE 0.02 % IN SOLN
0.5000 mg | RESPIRATORY_TRACT | Status: AC
  Administered 2016-11-10: 0.5 mg via RESPIRATORY_TRACT
  Filled 2016-11-10: qty 2.5

## 2016-11-10 NOTE — Consult Note (Addendum)
Consult Note                                                      [name]  [id number]  DOB: 04/18/1961  DOA: 11/10/2016  PCP: Rosita Fire, MD   Outpatient Specialists: Dr Luan Pulling   Requesting physician: Dr Tomi Bamberger  Reason for consultation:  Fever   History of Present llness   Jerome Daniel is an 56 y.o. male  Who has COPD, past H/O smoking, comes in with 2 day H/O sore throat, runny nose, cough-SOB and wheezing, he presented to the ER at 5.45 am, He arrived he was hypotensive, had wheezing and appeared dehydrated, he had a fever of 103 but no leukocytosis.  IV fluids were ordered and started at 6:30, he received Levaquin and received nebulizer treatments with resolution of wheezing, I was called to evaluate for admission at 7:30 AM.  When I went to evaluate the patient he was sitting on the side of the bed and tells me that he feels a whole lot better although he still feeling weak and would like to go home, he is not requiring any oxygen, currently minimal to no wheezing, he says he has not eaten or drank much fluids in the last 2 days and feels that he is dehydrated.    Review Of Systems     In addition to the HPI above,   Was at the fevers without chills, +ve sore throat No Headache, No changes with Vision or hearing, No problems swallowing food or Liquids, No Chest pain, Positive Cough, wheezing and Shortness of Breath, No Abdominal pain, No Nausea or Vommitting, Bowel movements are regular, No Blood in stool or Urine, No dysuria, No new skin rashes or bruises, No new joints pains-aches,  No new weakness, tingling, numbness in any extremity, No recent weight gain or loss, No polyuria, polydypsia or polyphagia, No significant Mental  Stressors.  A full 10 point Review of Systems was done, except as stated above, all other Review of Systems were negative.    Social History   Social History  Substance Use Topics  . Smoking status: Current Some Day Smoker    Packs/day: 1.00    Types: Cigarettes  . Smokeless tobacco: Former Systems developer    Types: Snuff  . Alcohol use No      Family History   Family History  Problem Relation Age of Onset  . Diabetes Mother       Medications   Prior to Admission medications   Medication Sig Start Date End Date Taking?  Authorizing Provider  albuterol (PROVENTIL HFA;VENTOLIN HFA) 108 (90 Base) MCG/ACT inhaler Inhale 2 puffs into the lungs every 4 (four) hours as needed for wheezing or shortness of breath. 05/16/16   Noemi Chapel, MD  albuterol (PROVENTIL HFA;VENTOLIN HFA) 108 (90 Base) MCG/ACT inhaler Inhale 2 puffs into the lungs every 4 (four) hours as needed for wheezing or shortness of breath. 06/28/16   Kristen N Ward, DO  albuterol (PROVENTIL HFA;VENTOLIN HFA) 108 (90 Base) MCG/ACT inhaler Inhale 1-2 puffs into the lungs every 6 (six) hours as needed for wheezing or shortness of breath. 08/29/16   Margette Fast, MD  amoxicillin-clavulanate (AUGMENTIN) 875-125 MG tablet Take 1 tablet by mouth every 12 (twelve) hours. 06/28/16   Kristen N Ward, DO  dicyclomine (BENTYL) 20 MG tablet Take 1 tablet (20 mg total) by mouth 2 (two) times daily. As needed for abdominal pain 06/28/16   Delice Bison Ward, DO  docusate sodium (COLACE) 100 MG capsule Take 1 capsule (100 mg total) by mouth every 12 (twelve) hours. 06/28/16   Kristen N Ward, DO  Fluticasone-Salmeterol (ADVAIR DISKUS) 100-50 MCG/DOSE AEPB Inhale 1 puff into the lungs 2 (two) times daily. 08/29/16   Margette Fast, MD  Fluticasone-Salmeterol (ADVAIR) 100-50 MCG/DOSE AEPB Inhale 1 puff into the lungs 2 (two) times daily.    Historical Provider, MD  hydrOXYzine (ATARAX/VISTARIL) 25 MG tablet Take 1-2 tablets (25-50 mg total) by mouth every 6  (six) hours as needed for anxiety. 07/28/16   Sherwood Gambler, MD  ibuprofen (ADVIL,MOTRIN) 800 MG tablet Take 1 tablet (800 mg total) by mouth 3 (three) times daily. 05/16/16   Noemi Chapel, MD  ondansetron (ZOFRAN ODT) 4 MG disintegrating tablet Take 1 tablet (4 mg total) by mouth every 8 (eight) hours as needed for nausea or vomiting. 06/28/16   Delice Bison Ward, DO  polyethylene glycol (MIRALAX) packet Take 17 g by mouth 2 (two) times daily. 06/28/16   Delice Bison Ward, DO    Anti-infectives    Start     Dose/Rate Route Frequency Ordered Stop   11/10/16 1000  oseltamivir (TAMIFLU) capsule 75 mg     75 mg Oral 2 times daily 11/10/16 0751 11/15/16 0959   11/10/16 0615  levofloxacin (LEVAQUIN) IVPB 750 mg     750 mg 100 mL/hr over 90 Minutes Intravenous  Once 11/10/16 0607 11/10/16 0750      Scheduled Meds: . oseltamivir  75 mg Oral BID   Continuous Infusions: . sodium chloride 1,000 mL (11/10/16 0756)   PRN Meds:.  No Known Allergies  Objective   Physical Exam  Vitals  Blood pressure 112/60, pulse 92, temperature 99.4 F, temperature source Oral, resp. rate 20, height 5\' 8"  (1.727 m), weight 90.7 kg (200 lb), SpO2 99 %.   1. General Middle-aged well-built white male sitting on the side of hospital bed in no distress but appears weak and tired,    2. Normal affect and insight, Not Suicidal or Homicidal, Awake Alert, Oriented X 3.  3. No F.N deficits, ALL C.Nerves Intact, Strength 5/5 all 4 extremities, Sensation intact all 4 extremities, Plantars down going.  4. Ears and Eyes appear Normal, Conjunctivae clear, PERRLA. Moist Oral Mucosa.  5. Supple Neck, No JVD, No cervical lymphadenopathy appriciated, No Carotid Bruits.  6. Symmetrical Chest wall movement, Good air movement bilaterally, minimal wheezing.  7. RRR, No Gallops, Rubs or Murmurs, No Parasternal Heave.  8. Positive Bowel Sounds, Abdomen Soft, No tenderness, No organomegaly appriciated,No rebound -guarding or  rigidity.  9.  No Cyanosis, Normal Skin Turgor, No Skin Rash or Bruise.  10. Good muscle tone,  joints appear normal , no effusions, Normal ROM.  11. No Palpable Lymph Nodes in Neck or Axillae     Data   CBC  Recent Labs Lab 11/10/16 0551  WBC 9.1  HGB 15.9  HCT 48.4  PLT 217  MCV 96.8  MCH 31.8  MCHC 32.9  RDW 13.4  LYMPHSABS 0.9  MONOABS 0.7  EOSABS 0.0  BASOSABS 0.0   ------------------------------------------------------------------------------------------------------------------  Chemistries   Recent Labs Lab 11/10/16 0551  NA 134*  K 3.8  CL 97*  CO2 27  GLUCOSE 109*  BUN 19  CREATININE 1.11  CALCIUM 8.6*  AST 22  ALT 14*  ALKPHOS 47  BILITOT 0.9   ------------------------------------------------------------------------------------------------------------------ estimated creatinine clearance is 83.2 mL/min (by C-G formula based on SCr of 1.11 mg/dL). ------------------------------------------------------------------------------------------------------------------ No results for input(s): TSH, T4TOTAL, T3FREE, THYROIDAB in the last 72 hours.  Invalid input(s): FREET3   Coagulation profile  Recent Labs Lab 11/10/16 0551  INR 1.07   ------------------------------------------------------------------------------------------------------------------- No results for input(s): DDIMER in the last 72 hours. -------------------------------------------------------------------------------------------------------------------  Cardiac Enzymes No results for input(s): CKMB, TROPONINI, MYOGLOBIN in the last 168 hours.  Invalid input(s): CK ------------------------------------------------------------------------------------------------------------------ Invalid input(s): POCBNP   ---------------------------------------------------------------------------------------------------------------  Urinalysis    Component Value Date/Time   COLORURINE  YELLOW 06/28/2016 0320   APPEARANCEUR CLEAR 06/28/2016 0320   LABSPEC <1.005 (L) 06/28/2016 0320   PHURINE 6.5 06/28/2016 0320   GLUCOSEU NEGATIVE 06/28/2016 0320   HGBUR NEGATIVE 06/28/2016 0320   BILIRUBINUR NEGATIVE 06/28/2016 0320   KETONESUR NEGATIVE 06/28/2016 0320   PROTEINUR NEGATIVE 06/28/2016 0320   UROBILINOGEN 0.2 06/08/2014 1800   NITRITE NEGATIVE 06/28/2016 0320   LEUKOCYTESUR NEGATIVE 06/28/2016 0320     Imaging    Dg Chest Port 1 View  Result Date: 11/10/2016 CLINICAL DATA:  Fever and cough. EXAM: PORTABLE CHEST 1 VIEW COMPARISON:  No recent prior . FINDINGS: Mediastinum hilar structures normal. Cardiomegaly with normal pulmonary vascularity. Low lung volumes with basilar atelectasis. Right lower lobe infiltrate. No pleural effusion or pneumothorax . IMPRESSION: 1. Right lower lobe infiltrate consistent with pneumonia. 2. Low lung volumes with by base atelectasis . Electronically Signed   By: Marcello Moores  Register   On: 11/10/2016 07:25       Assessment & Paln    Patient has presented with viral illness most likely influenza pneumonia, he severely dehydrated from poor oral intake for the last 2 days, he is otherwise well built well-nourished 56 year old male with history of COPD.  In my opinion he was started on IV fluids about an hour ago, we need to give him at least 2-3 hours to respond to the provided treatments, I agree with IV fluids which I will give him more, he has already received steroids, I have started him on Tamiflu. Influenza panel has been ordered.  I have ambulated the patient myself in the hallway, he feels fine, he is not dizzy, he does feel weak but that is expected of viral flulike illness, will reevaluate the patient in 2 hours. If he is still hypotensive or not feeling well and will keep him for observation. If in 2 hours time he has responded well to medical interventions he could be discharged home. I will update my recommendations at that  time.    Reevaluated the patient at 10 AM. He says he feels at least 60% better, he ambulated with me for 5  minutes, still feels weak but not lightheaded, blood pressure now is 112/70. Will give him something to eat, another liter of fluid. Reevaluate in an hour most likely home. In the meantime influenza A has come back positive, I had already started him on Tamiflu earlier.   11.30 am -   Influenza A pneumonia patient with COPD.  Patient came back flu positive, placed on Tamiflu. He now says he feels about 80% better, still has mild generalized weakness but not dizzy. Ambulated with me in the hallway without any distress. Blood pressure checked by me personally is 112/60 heart rate 92/min. Went back in patient's chart for the last 7 visits he always runs low blood pressure.  At this time he will be discharged home on Tamiflu 5 day course, he shouldn't informs me that he has had bacterial pneumonia 5 times in the last 1 year and has capacity to get a bacterial infection hence will be placed on 4 days of Levaquin. Continue home nebulizer treatments and follow with PCP in 2-3 days. Request PCP to check CBC, BMP and a 2 view chest x-ray next visit.  Note patient at times lives alone and still feels weak, will try to arrange for a walker as well. He walked with me in the hallway without any distress prior to being discharged.    Allergies as of 11/10/2016   No Known Allergies     Medication List    STOP taking these medications   hydrOXYzine 25 MG tablet Commonly known as:  ATARAX/VISTARIL     TAKE these medications   albuterol 108 (90 Base) MCG/ACT inhaler Commonly known as:  PROVENTIL HFA;VENTOLIN HFA Inhale 2 puffs into the lungs every 4 (four) hours as needed for wheezing or shortness of breath.   Fluticasone-Salmeterol 100-50 MCG/DOSE Aepb Commonly known as:  ADVAIR DISKUS Inhale 1 puff into the lungs 2 (two) times daily.   levofloxacin 750 MG tablet Commonly known as:   LEVAQUIN Take 1 tablet (750 mg total) by mouth daily.   loperamide 2 MG capsule Commonly known as:  IMODIUM Take 2 capsules (4 mg total) by mouth every 6 (six) hours as needed for diarrhea or loose stools.   oseltamivir 75 MG capsule Commonly known as:  TAMIFLU Take 1 capsule (75 mg total) by mouth 2 (two) times daily.   SYMBICORT 80-4.5 MCG/ACT inhaler Generic drug:  budesonide-formoterol Inhale 2 puffs into the lungs 2 (two) times daily.            Durable Medical Equipment        Start     Ordered   11/10/16 1007  For home use only DME Walker  Once    Question:  Patient needs a walker to treat with the following condition  Answer:  Weakness   11/10/16 1006      DVT Prophylaxis    AM Labs Ordered, also please review Full Orders  Family Communication: Plan discussed with patient and he is comfortable with it   Thank you for the consult, we will follow the patient with you in the Hospital.   Lala Lund K M.D on 11/10/2016 at 8:39 AM  Between 7am to 7pm - Pager - [phone]. After 7pm go to [email or web address] - password [email or web address]  [facility]  - Office  [phone]   Thank you for the consult, we will follow the patient with you in the Hospital.
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
## 2016-11-10 NOTE — Care Management (Signed)
Dr. Candiss Norse called CM regarding walker for patient. Arlington Calix of [facility] notified and will deliver walker to patient prior to discharge.

## 2016-11-10 NOTE — ED Triage Notes (Signed)
Pt reports generalized weakness and productive cough with green sputum x 2 days.  Pt reports he feels like had has been running a fever, denies pain or nausea or vomiitng

## 2016-11-10 NOTE — ED Notes (Addendum)
Pt ambulated in hallway per hospitalist's request. BP 89/55 after walking. Pt reported dizziness when he initially stood up. Pt's gait was slightly off upon first standing, but then was able to ambulate with steady gait. Pt reported he felt very weak, but did feel better than when he first came into ED. Pt reports he feels as if he needs more time here at the hospital due to feeling so weak and "bad".

## 2016-11-10 NOTE — Discharge Instructions (Signed)
Follow with Primary MD FANTA,TESFAYE, MD in 2-3 days   Get CBC, CMP, 2 view Chest X ray checked  by Primary MD in 2-3 days ( we routinely change or add medications that can affect your baseline labs and fluid status, therefore we recommend that you get the mentioned basic workup next visit with your PCP, your PCP may decide not to get them or add new tests based on their clinical decision)   Activity: As tolerated with Full fall precautions use walker/cane & assistance as needed   Disposition Home     Diet:    Heart Healthy   For Heart failure patients - Check your Weight same time everyday, if you gain over 2 pounds, or you develop in leg swelling, experience more shortness of breath or chest pain, call your Primary MD immediately. Follow Cardiac Low Salt Diet and 1.5 lit/day fluid restriction.   On your next visit with your primary care physician please Get Medicines reviewed and adjusted.   Please request your Prim.MD to go over all Hospital Tests and Procedure/Radiological results at the follow up, please get all Hospital records sent to your Prim MD by signing hospital release before you go home.   If you experience worsening of your admission symptoms, develop shortness of breath, life threatening emergency, suicidal or homicidal thoughts you must seek medical attention immediately by calling 911 or calling your MD immediately  if symptoms less severe.  You Must read complete instructions/literature along with all the possible adverse reactions/side effects for all the Medicines you take and that have been prescribed to you. Take any new Medicines after you have completely understood and accpet all the possible adverse reactions/side effects.   Do not drive, operate heavy machinery, perform activities at heights, swimming or participation in water activities or provide baby sitting services if your were admitted for syncope or siezures until you have seen by Primary MD or a Neurologist  and advised to do so again.  Do not drive when taking Pain medications.    Do not take more than prescribed Pain, Sleep and Anxiety Medications  Special Instructions: If you have smoked or chewed Tobacco  in the last 2 yrs please stop smoking, stop any regular Alcohol  and or any Recreational drug use.  Wear Seat belts while driving.   Please note  You were cared for by a hospitalist during your hospital stay. If you have any questions about your discharge medications or the care you received while you were in the hospital after you are discharged, you can call the unit and asked to speak with the hospitalist on call if the hospitalist that took care of you is not available. Once you are discharged, your primary care physician will handle any further medical issues. Please note that NO REFILLS for any discharge medications will be authorized once you are discharged, as it is imperative that you return to your primary care physician (or establish a relationship with a primary care physician if you do not have one) for your aftercare needs so that they can reassess your need for medications and monitor your lab values.

## 2016-11-10 NOTE — ED Notes (Addendum)
Pt still reporting he feels very weak. Pt continues to report he feels better than he initially did. BP 101/49 at this time, HR 109. Hospitalist notified.

## 2016-11-11 DIAGNOSIS — J111 Influenza due to unidentified influenza virus with other respiratory manifestations: Secondary | ICD-10-CM | POA: Diagnosis not present

## 2016-11-11 DIAGNOSIS — J449 Chronic obstructive pulmonary disease, unspecified: Secondary | ICD-10-CM | POA: Diagnosis not present

## 2016-11-11 DIAGNOSIS — F17200 Nicotine dependence, unspecified, uncomplicated: Secondary | ICD-10-CM | POA: Diagnosis not present

## 2016-11-13 LAB — CULTURE, RESPIRATORY: CULTURE: NORMAL

## 2016-11-13 LAB — CULTURE, RESPIRATORY W GRAM STAIN

## 2016-11-15 LAB — CULTURE, BLOOD (ROUTINE X 2)
CULTURE: NO GROWTH
Culture: NO GROWTH

## 2016-12-20 ENCOUNTER — Emergency Department (HOSPITAL_COMMUNITY)
Admission: EM | Admit: 2016-12-20 | Discharge: 2016-12-21 | Disposition: A | Discharge status: Home / Self Care | Payer: Medicare Other | Attending: Emergency Medicine | Admitting: Emergency Medicine

## 2016-12-20 ENCOUNTER — Encounter (HOSPITAL_COMMUNITY): Payer: Self-pay

## 2016-12-20 DIAGNOSIS — J219 Acute bronchiolitis, unspecified: Secondary | ICD-10-CM | POA: Diagnosis not present

## 2016-12-20 DIAGNOSIS — R05 Cough: Secondary | ICD-10-CM | POA: Diagnosis not present

## 2016-12-20 DIAGNOSIS — I712 Thoracic aortic aneurysm, without rupture: Secondary | ICD-10-CM | POA: Diagnosis not present

## 2016-12-20 DIAGNOSIS — R0602 Shortness of breath: Secondary | ICD-10-CM | POA: Diagnosis present

## 2016-12-20 DIAGNOSIS — R911 Solitary pulmonary nodule: Secondary | ICD-10-CM | POA: Insufficient documentation

## 2016-12-20 DIAGNOSIS — Z79899 Other long term (current) drug therapy: Secondary | ICD-10-CM | POA: Diagnosis not present

## 2016-12-20 DIAGNOSIS — J984 Other disorders of lung: Secondary | ICD-10-CM | POA: Diagnosis not present

## 2016-12-20 DIAGNOSIS — R918 Other nonspecific abnormal finding of lung field: Secondary | ICD-10-CM

## 2016-12-20 DIAGNOSIS — I7121 Aneurysm of the ascending aorta, without rupture: Secondary | ICD-10-CM

## 2016-12-20 DIAGNOSIS — Z87891 Personal history of nicotine dependence: Secondary | ICD-10-CM | POA: Insufficient documentation

## 2016-12-20 MED ORDER — DOXYCYCLINE HYCLATE 100 MG PO TABS
100.0000 mg | ORAL_TABLET | Freq: Once | ORAL | Status: AC
  Administered 2016-12-20: 100 mg via ORAL
  Filled 2016-12-20: qty 1

## 2016-12-20 MED ORDER — DEXAMETHASONE SODIUM PHOSPHATE 4 MG/ML IJ SOLN
8.0000 mg | Freq: Once | INTRAMUSCULAR | Status: AC
  Administered 2016-12-20: 8 mg via INTRAMUSCULAR
  Filled 2016-12-20: qty 2

## 2016-12-20 MED ORDER — PSEUDOEPHEDRINE HCL 60 MG PO TABS
60.0000 mg | ORAL_TABLET | Freq: Once | ORAL | Status: AC
  Administered 2016-12-20: 60 mg via ORAL
  Filled 2016-12-20: qty 1

## 2016-12-20 MED ORDER — ALBUTEROL SULFATE HFA 108 (90 BASE) MCG/ACT IN AERS
2.0000 | INHALATION_SPRAY | Freq: Once | RESPIRATORY_TRACT | Status: AC
  Administered 2016-12-20: 2 via RESPIRATORY_TRACT
  Filled 2016-12-20: qty 6.7
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
## 2016-12-20 NOTE — ED Triage Notes (Signed)
Shortness of breath with a history of COPD.  I have been short of breath for 2-3 hours.  Used my inhalers and they did not help.
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
## 2016-12-20 NOTE — ED Notes (Addendum)
Call patient in waiting area, no answer. Patient asleep in waiting area snoring. Had to shake patient to awaken him. Patient unsteady gait, brought to room via wheelchair.

## 2016-12-21 ENCOUNTER — Emergency Department (HOSPITAL_COMMUNITY): Payer: Medicare Other

## 2016-12-21 DIAGNOSIS — R05 Cough: Secondary | ICD-10-CM | POA: Diagnosis not present

## 2016-12-21 DIAGNOSIS — J984 Other disorders of lung: Secondary | ICD-10-CM | POA: Diagnosis not present

## 2016-12-21 DIAGNOSIS — J219 Acute bronchiolitis, unspecified: Secondary | ICD-10-CM | POA: Diagnosis not present

## 2016-12-21 LAB — BASIC METABOLIC PANEL
Anion gap: 7 (ref 5–15)
BUN: 12 mg/dL (ref 6–20)
CHLORIDE: 106 mmol/L (ref 101–111)
CO2: 26 mmol/L (ref 22–32)
Calcium: 8.8 mg/dL — ABNORMAL LOW (ref 8.9–10.3)
Creatinine, Ser: 0.69 mg/dL (ref 0.61–1.24)
GFR calc Af Amer: >60 mL/min (ref >60)
GFR calc non Af Amer: >60 mL/min (ref >60)
GLUCOSE: 107 mg/dL — AB (ref 65–99)
POTASSIUM: 3.6 mmol/L (ref 3.5–5.1)
Sodium: 139 mmol/L (ref 135–145)

## 2016-12-21 MED ORDER — ALBUTEROL SULFATE HFA 108 (90 BASE) MCG/ACT IN AERS: 2.0000 | INHALATION_SPRAY | RESPIRATORY_TRACT | 0 refills | Status: DC | PRN

## 2016-12-21 MED ORDER — DOXYCYCLINE HYCLATE 100 MG PO CAPS: 100.0000 mg | ORAL_CAPSULE | Freq: Two times a day (BID) | ORAL | 0 refills | Status: DC

## 2016-12-21 MED ORDER — IOPAMIDOL (ISOVUE-300) INJECTION 61%
75.0000 mL | Freq: Once | INTRAVENOUS | Status: AC | PRN
  Administered 2016-12-21: 75 mL via INTRAVENOUS

## 2016-12-21 NOTE — ED Notes (Signed)
Patient transported to X-ray 

## 2016-12-21 NOTE — ED Provider Notes (Signed)
56 year old male coming in with dad dyspnea and cough. Chest x-ray showed possible cavitary lesion, so he was sent for CT scan. CT scan showed some of bacterial bronchiolitis as well as several pulmonary nodules. Incidental finding of dilatation of the ascending aorta. He is discharged with prescription for doxycycline and is referred back to PCP. He will need repeat CT scan of his chest in 3-6 months and then follow-up CT scan of his chest in 18-24 months. He will also need annual CTA or MRA to evaluate his ascending aortic aneurysm.  Results for orders placed or performed during the hospital encounter of 93/81/01  Basic metabolic panel  Result Value Ref Range   Sodium 139 135 - 145 mmol/L   Potassium 3.6 3.5 - 5.1 mmol/L   Chloride 106 101 - 111 mmol/L   CO2 26 22 - 32 mmol/L   Glucose, Bld 107 (H) 65 - 99 mg/dL   BUN 12 6 - 20 mg/dL   Creatinine, Ser 0.69 0.61 - 1.24 mg/dL   Calcium 8.8 (L) 8.9 - 10.3 mg/dL   GFR calc non Af Amer >60 >60 mL/min   GFR calc Af Amer >60 >60 mL/min   Anion gap 7 5 - 15   Dg Chest 2 View  Result Date: 12/21/2016 CLINICAL DATA:  Dyspnea and cough. Diagnosed with flu 1 month ago. Former smoker. EXAM: CHEST  2 VIEW COMPARISON:  None. FINDINGS: Emphysematous hyperinflation of the lungs. Possible tiny thin walled cavitation, bulla or pneumatocele is in the right mid lung best seen on the frontal view. This measures approximately 8 mm. Dense circumscribed nodule noted at the left lung base also seen to best advantage on the frontal view measuring 7 mm. No pulmonary consolidation. Heart is normal in size. No aortic aneurysm. No suspicious osseous lesions. IMPRESSION: Emphysematous hyperinflation of lungs. Possible thin walled cavitation or pneumatocyst in the right mid lung measuring 8 mm. A 7 mm nodule is seen at the left lung base. CT for better assessment suggested. Electronically Signed   By: Ashley Royalty M.D.   On: 12/21/2016 00:56   Ct Chest W Contrast  Result  Date: 12/21/2016 CLINICAL DATA:  Dyspnea and cough. Diagnosed with flu 1 month ago. Cavitations seen on same day CXR EXAM: CT CHEST WITH CONTRAST TECHNIQUE: Multidetector CT imaging of the chest was performed during intravenous contrast administration. CONTRAST:  62mL ISOVUE-300 IOPAMIDOL (ISOVUE-300) INJECTION 61% COMPARISON:  Same day CXR. FINDINGS: Cardiovascular: Normal size cardiac chambers. No pericardial effusion. Ectatic ascending aorta measuring up to 3.8 cm. Mediastinum/Nodes: No thyroid mass or nodule. Normal branch pattern for the great vessels off the aortic arch. No supraclavicular, axillary nor mediastinal lymphadenopathy. Mildly enlarged right hilar lymph node at 1.2 cm possibly reactive. Lungs/Pleura: Right middle lobe and lower lobe tree-in-bud densities are in keeping with infectious bronchiolitis possibly mycobacterial or viral. There are small cavitations in the right upper lobe measuring 1 cm and in the right lower lobe measuring 1.8 cm with slightly irregular margins. Non cavitary nodular densities are seen in posterior segment of the right upper lobe measuring 0.7 cm and medial left lower lobe measuring 1 cm. Biapical subpleural densities are also noted on the right, more linear in appearance more likely representing a scar with a smaller nodular density in the left lung apex measuring 0.6 cm. Upper Abdomen: No acute abnormality. Musculoskeletal: No chest wall abnormality. No acute or significant osseous findings. IMPRESSION: 1. Tree-in-bud pulmonary densities in the right middle and lower lobe distribution consistent with  infectious bronchiolitis. Small cavitations in the right upper lobe and right lower lobe are noted measuring 1 cm and 1.8 cm respectively. Findings may represent small postinfectious cavitations, however there are non cavitary nodular densities also noted bilaterally measuring between 0.7 cm and 1 cm. Differential possibilities cannot exclude neoplasm or potentially even  septic emboli. Non-contrast chest CT at 3-6 months is recommended. If the nodules are stable at time of repeat CT, then future CT at 18-24 months (from today's scan) is considered optional for low-risk patients, but is recommended for high-risk patients. This recommendation follows the consensus statement: Guidelines for Management of Incidental Pulmonary Nodules Detected on CT Images: From the Fleischner Society 2017; Radiology 2017; 284:228-243. 2. Mild right hilar lymphadenopathy possibly reactive given right-sided findings. 3. Mild dilatation of the ascending aorta to 3.8 cm. Recommend annual imaging followup by CTA or MRA. This recommendation follows 2010 ACCF/AHA/AATS/ACR/ASA/SCA/SCAI/SIR/STS/SVM Guidelines for the Diagnosis and Management of Patients with Thoracic Aortic Disease. Circulation.2010; 121: X735-H299 Electronically Signed   By: Ashley Royalty M.D.   On: 12/21/2016 02:57   Images viewed by me.  Medical screening examination/treatment/procedure(s) were conducted as a shared visit with non-physician practitioner(s) and myself.  I personally evaluated the patient during the encounter.     Delora Fuel, MD 24/26/83 [id number]

## 2016-12-21 NOTE — Discharge Instructions (Signed)
Your CT scan showed some nodules in your lungs, and radiologist recommends repeat CT scan in 3-6 months, and another in 18-24 months.  Your CT scan also showed an enlarged blood vessel in the chest. This will need to be followed with either a CT angiogram or MR angiogram once a year. Dr. Legrand Rams can arrange these studies.

## 2017-09-10 IMAGING — CT CT ABD-PELV W/ CM
2 of 5 series · 16 of 46 positions shown, 18 images · IV contrast (iopamidol)
Comparison: PET CT 07/10/2014

CLINICAL DATA: Right upper and lower quadrant pain with nausea

EXAM:
CT ABDOMEN AND PELVIS WITH CONTRAST
TECHNIQUE: Multidetector CT imaging of the abdomen and pelvis was performed
using the standard protocol following bolus administration of
intravenous contrast.
CONTRAST:  100mL HO9NTB-EGG IOPAMIDOL (HO9NTB-EGG) INJECTION 61%

[Series 2: routine abd pel with · axial · 0.81mm/px · z∈[-456,-26]mm · 13 of 98 slices shown, 15 images]
[im 6/98  soft-tissue]
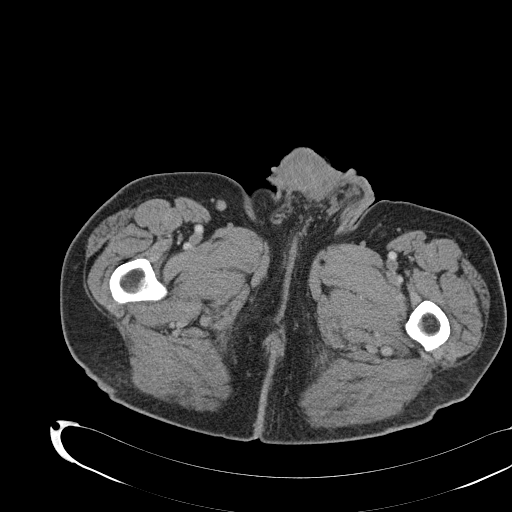
[im 6/98  bone]
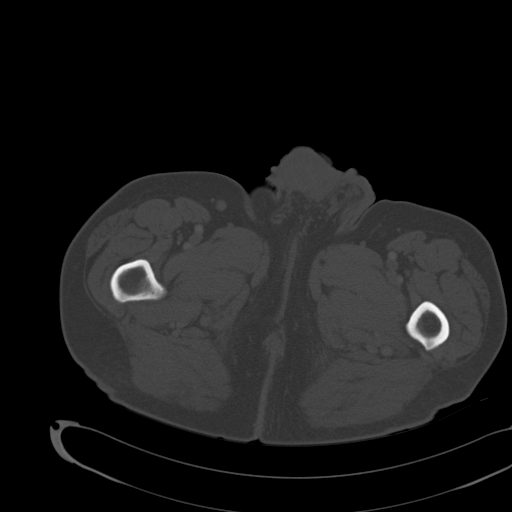
[im 12/98  soft-tissue]
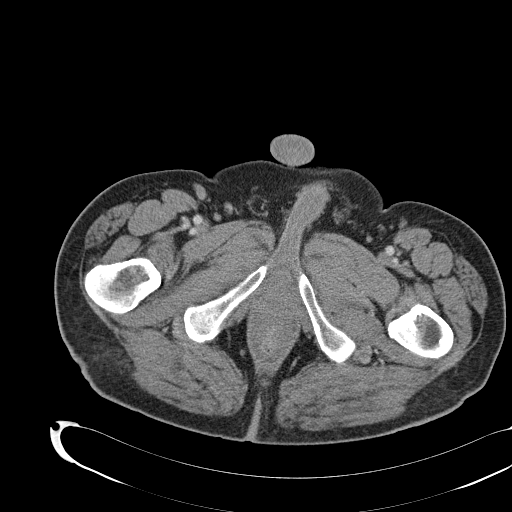
[im 23/98  soft-tissue]
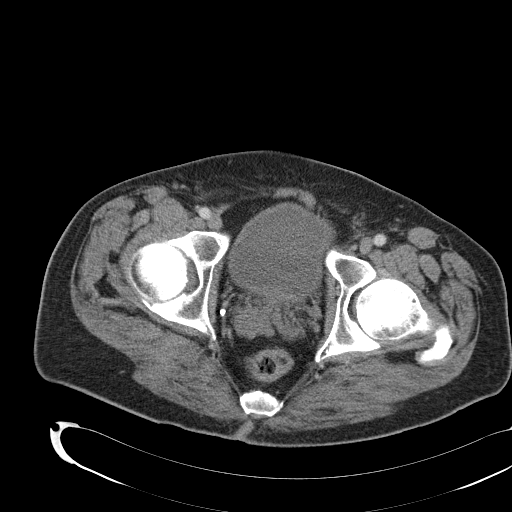
[im 29/98  soft-tissue]
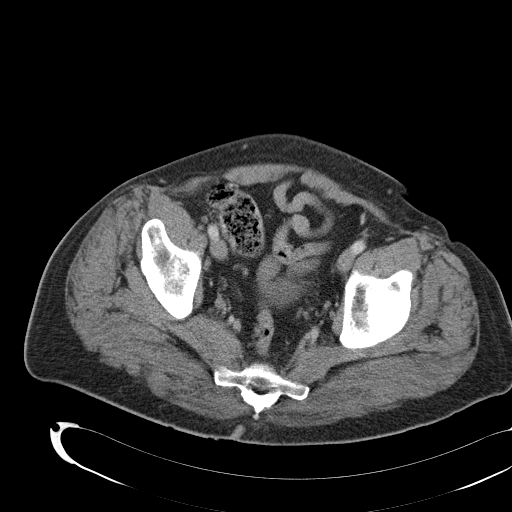
[im 35/98  soft-tissue]
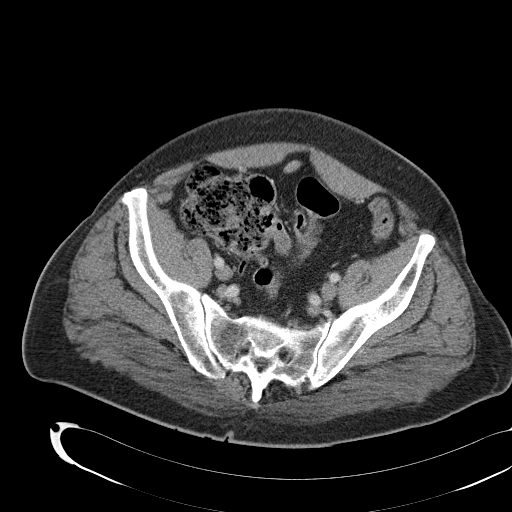
[im 40/98  soft-tissue]
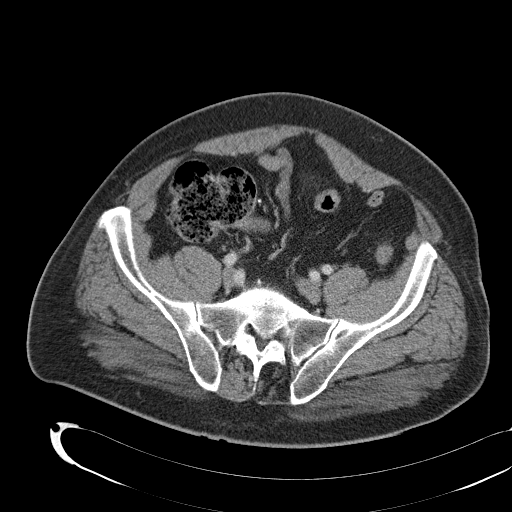
[im 52/98  soft-tissue]
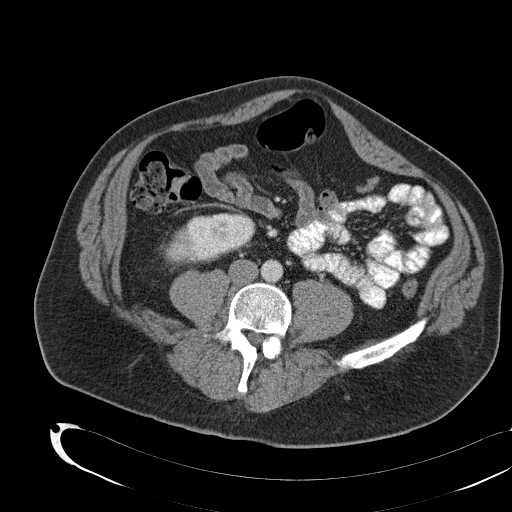
[im 58/98  soft-tissue]
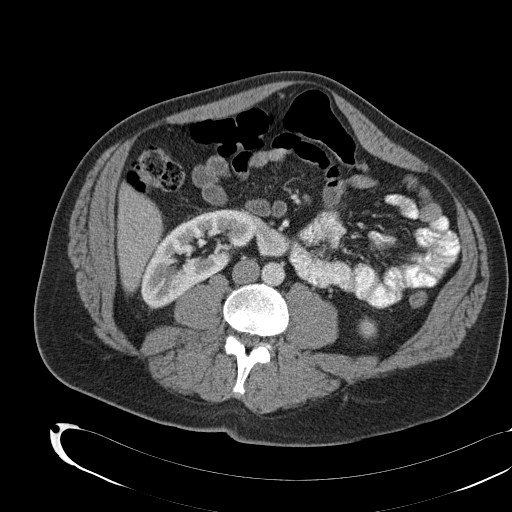
[im 63/98  soft-tissue]
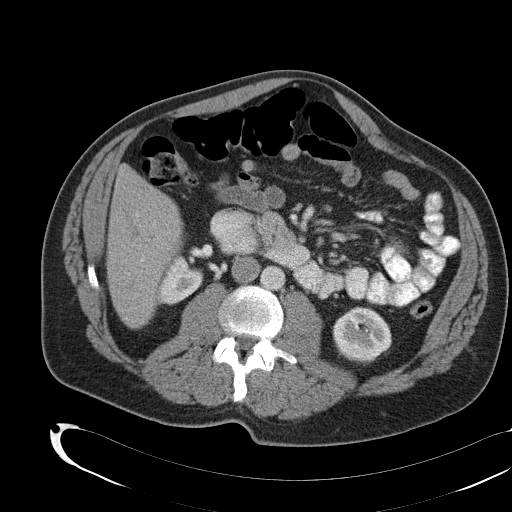
[im 63/98  bone]
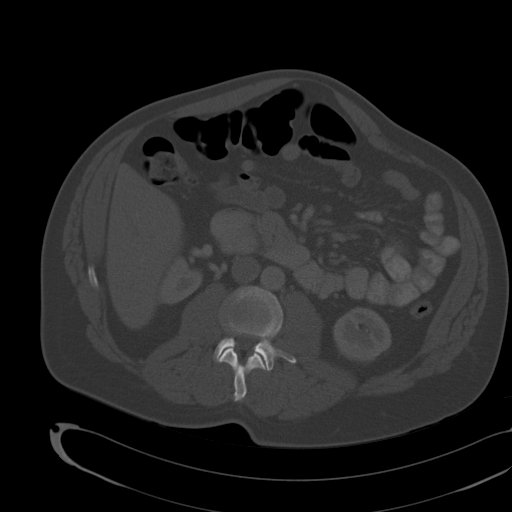
[im 69/98  soft-tissue]
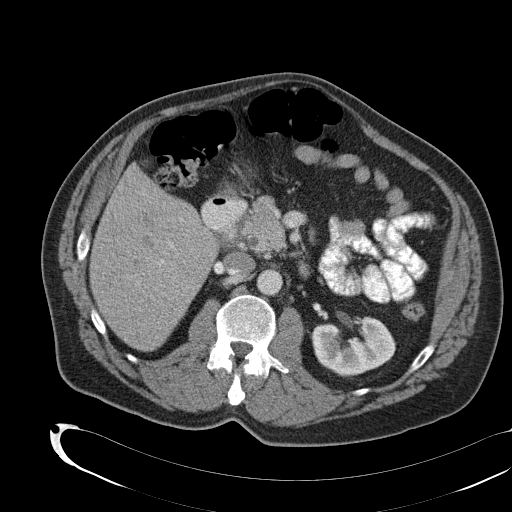
[im 75/98  soft-tissue]
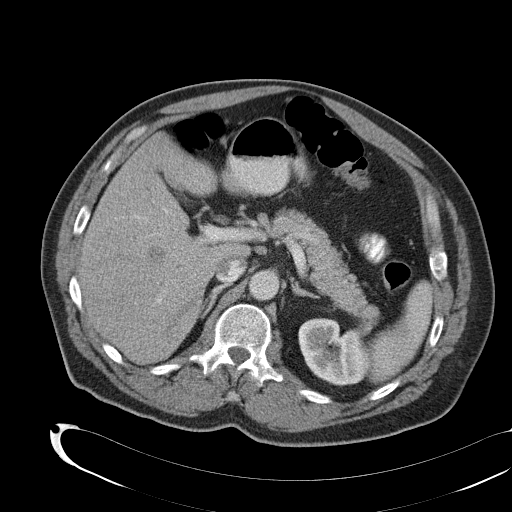
[im 86/98  soft-tissue]
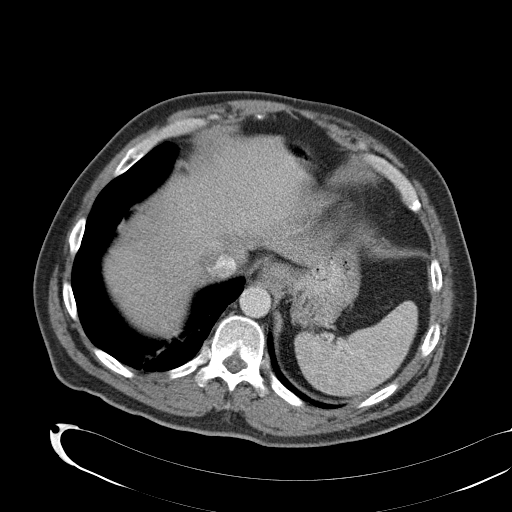
[im 92/98  soft-tissue]
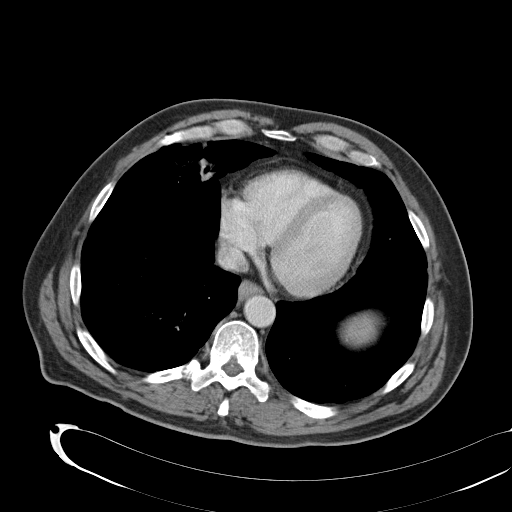

[Series 3: coronal · coronal · 0.81mm/px · 3 of 151 slices shown]
[im 51/151  soft-tissue]
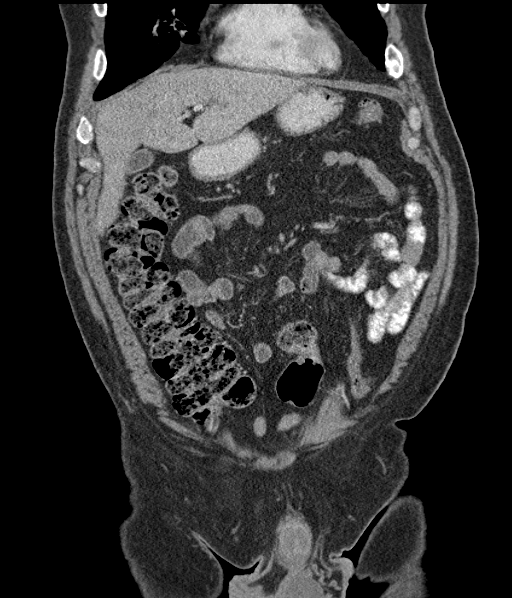
[im 67/151  soft-tissue]
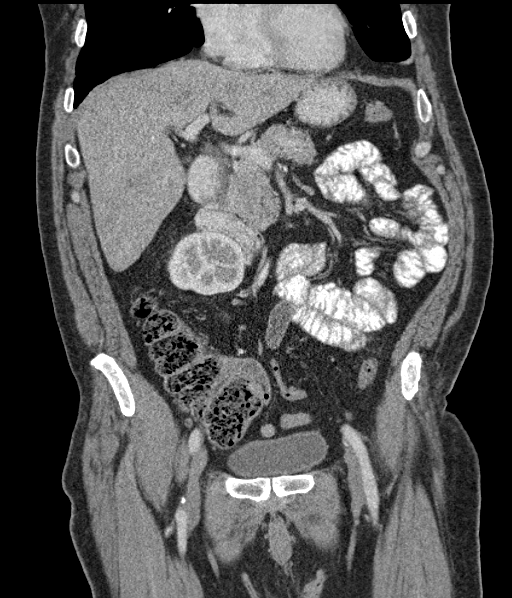
[im 84/151  soft-tissue]
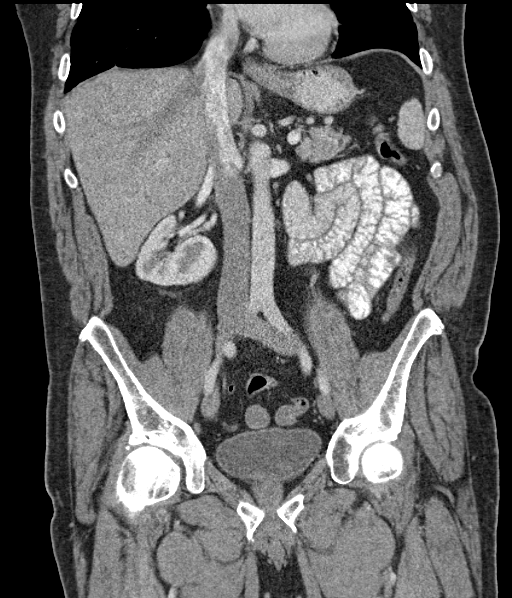

[16 of 46 positions shown; findings below may reference images not displayed]

FINDINGS: Lower chest: There is septal thickening in with associated airspace
opacity within the right middle lobe. There are tree-in-bud
opacities within the basal right lower lobe. No pleural effusion. No
visible pericardial effusion.

Hepatobiliary: Normal hepatic size and contours without focal liver
lesion. No perihepatic ascites. No intra- or extrahepatic biliary
dilatation. Normal gallbladder.

Pancreas: Normal pancreatic contours and enhancement. No
peripancreatic fluid collection or pancreatic ductal dilatation.

Spleen: Normal.

Adrenals/Urinary Tract: Normal adrenal glands. No hydronephrosis or
solid renal mass.

Stomach/Bowel: No abnormal bowel dilatation. No bowel wall
thickening or adjacent fat stranding to indicate acute inflammation.
No abdominal fluid collection. Normal appendix.

Vascular/Lymphatic: Normal course and caliber of the major abdominal
vessels. No abdominal or pelvic adenopathy.

Reproductive: The prostate is enlarged, measuring 5.7 cm in
transverse dimension with multiple calcifications. Seminal vesicles
are unremarkable.

Musculoskeletal: Marked disc space narrowing with grade 1
retrolisthesis of L5-S1. No lytic or blastic lesions. Normal
visualized extrathoracic and extraperitoneal soft tissues.

Other: No contributory non-categorized findings.
IMPRESSION: 1. Right lung base tree-in-bud opacities suggesting aspiration.
Right middle lobe airspace opacities and septal thickening [REDACTED]
also indicate sequelae of aspiration versus infection. Follow-up
chest CT is recommended in 6-12 weeks to ensure resolution and
exclude the possibility of a superimposed mass.
2. No acute abnormality of the abdomen or pelvis.

## 2017-09-11 DIAGNOSIS — F17200 Nicotine dependence, unspecified, uncomplicated: Secondary | ICD-10-CM | POA: Diagnosis not present

## 2017-09-11 DIAGNOSIS — J449 Chronic obstructive pulmonary disease, unspecified: Secondary | ICD-10-CM | POA: Diagnosis not present

## 2017-09-21 DIAGNOSIS — C61 Malignant neoplasm of prostate: Secondary | ICD-10-CM | POA: Diagnosis not present

## 2017-09-21 DIAGNOSIS — J449 Chronic obstructive pulmonary disease, unspecified: Secondary | ICD-10-CM | POA: Diagnosis not present

## 2017-09-21 DIAGNOSIS — Z0000 Encounter for general adult medical examination without abnormal findings: Secondary | ICD-10-CM | POA: Diagnosis not present

## 2017-09-21 DIAGNOSIS — Z1389 Encounter for screening for other disorder: Secondary | ICD-10-CM | POA: Diagnosis not present

## 2017-09-21 DIAGNOSIS — Z6830 Body mass index (BMI) 30.0-30.9, adult: Secondary | ICD-10-CM | POA: Diagnosis not present

## 2017-09-21 DIAGNOSIS — E785 Hyperlipidemia, unspecified: Secondary | ICD-10-CM | POA: Diagnosis not present

## 2017-10-04 ENCOUNTER — Telehealth: Payer: Self-pay

## 2017-10-04 NOTE — Telephone Encounter (Signed)
Pt called asking for DS. He had received a letter to be triaged. I told him that DS was at lunch and I would put in a phone note for her to call. Pt isn't having any GI problems and is past due having his colonoscopy done. Please call [phone]

## 2017-10-12 ENCOUNTER — Telehealth: Payer: Self-pay

## 2017-10-12 NOTE — Telephone Encounter (Signed)
Gastroenterology Pre-Procedure Review  Request Date:10/12/2017 Requesting Physician: Dr. Legrand Rams  PATIENT REVIEW QUESTIONS: The patient responded to the following health history questions as indicated:    This will be the pt's first colonoscopy  1. Diabetes Melitis: no 2. Joint replacements in the past 12 months: no 3. Major health problems in the past 3 months: no 4. Has an artificial valve or MVP: no 5. Has a defibrillator: no 6. Has been advised in past to take antibiotics in advance of a procedure like teeth cleaning: no 7. Family history of colon cancer: no  8. Alcohol Use: no 9. History of sleep apnea: no  10. History of coronary artery or other vascular stents placed within the last 12 months: no 11. History of any prior anesthesia complications: no    MEDICATIONS & ALLERGIES:    Patient reports the following regarding taking any blood thinners:   Plavix? no Aspirin? no Coumadin? no Brilinta? no Xarelto? no Eliquis? no Pradaxa? no Savaysa? no Effient? no  Patient confirms/reports the following medications:  Current Outpatient Medications  Medication Sig Dispense Refill  . albuterol (2.5 MG/3ML) 0.083% NEBU 3 mL, albuterol (5 MG/ML) 0.5% NEBU 0.5 mL Inhale into the lungs every 6 (six) hours. Pt uses 3-4 times a day    . albuterol (PROVENTIL HFA;VENTOLIN HFA) 108 (90 Base) MCG/ACT inhaler Inhale 2 puffs into the lungs every 4 (four) hours as needed for wheezing or shortness of breath (or coughing). 1 Inhaler 0  . SYMBICORT 80-4.5 MCG/ACT inhaler Inhale 2 puffs into the lungs 2 (two) times daily.      No current facility-administered medications for this visit.     Patient confirms/reports the following allergies:  No Known Allergies  No orders of the defined types were placed in this encounter.   AUTHORIZATION INFORMATION Primary Insurance:   ID #: Group #:  Pre-Cert / Auth required:  Pre-Cert / Auth #:   Secondary Insurance:   ID #:  Group #:  Pre-Cert /  Auth required:  Pre-Cert / Auth #:   SCHEDULE INFORMATION: Procedure has been scheduled as follows:  Date: 11/06/2017             Time:   10:30 am Location: [facility] Short Stay  This Gastroenterology Pre-Precedure Review Form is being routed to the following provider(s): Barney Drain, MD

## 2017-10-12 NOTE — Telephone Encounter (Signed)
See separate triage.  

## 2017-10-18 ENCOUNTER — Other Ambulatory Visit: Payer: Self-pay

## 2017-10-18 DIAGNOSIS — Z1211 Encounter for screening for malignant neoplasm of colon: Secondary | ICD-10-CM

## 2017-10-18 MED ORDER — PEG 3350-KCL-NA BICARB-NACL 420 G PO SOLR: 4000.0000 mL | ORAL | 0 refills | Status: DC

## 2017-10-18 NOTE — Telephone Encounter (Signed)
Rx sent to the pharmacy and instructions mailed to pt.  

## 2017-10-18 NOTE — Telephone Encounter (Signed)
Appropriate.

## 2017-11-06 ENCOUNTER — Encounter: Payer: Self-pay | Admitting: Gastroenterology

## 2017-11-06 ENCOUNTER — Encounter (HOSPITAL_COMMUNITY): Admission: RE | Payer: Self-pay | Source: Ambulatory Visit

## 2017-11-06 ENCOUNTER — Ambulatory Visit (HOSPITAL_COMMUNITY): Admission: RE | Admit: 2017-11-06 | Payer: Medicare Other | Source: Ambulatory Visit | Admitting: Gastroenterology

## 2017-11-06 SURGERY — COLONOSCOPY
Anesthesia: Moderate Sedation

## 2017-11-06 NOTE — Telephone Encounter (Signed)
I received a call from [place], RN for the endoscopy lab and she stated that the patient no showed for his tcs
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
## 2017-11-06 NOTE — Telephone Encounter (Signed)
PATIENT SCHEDULED  °

## 2017-11-07 ENCOUNTER — Other Ambulatory Visit: Payer: Self-pay

## 2017-11-07 ENCOUNTER — Emergency Department (HOSPITAL_COMMUNITY)
Admission: EM | Admit: 2017-11-07 | Discharge: 2017-11-07 | Disposition: A | Discharge status: Home / Self Care | Payer: Medicare Other | Attending: Emergency Medicine | Admitting: Emergency Medicine

## 2017-11-07 ENCOUNTER — Encounter (HOSPITAL_COMMUNITY): Payer: Self-pay | Admitting: Emergency Medicine

## 2017-11-07 ENCOUNTER — Emergency Department (HOSPITAL_COMMUNITY): Payer: Medicare Other

## 2017-11-07 DIAGNOSIS — R51 Headache: Secondary | ICD-10-CM | POA: Insufficient documentation

## 2017-11-07 DIAGNOSIS — R05 Cough: Secondary | ICD-10-CM | POA: Diagnosis not present

## 2017-11-07 DIAGNOSIS — Z87891 Personal history of nicotine dependence: Secondary | ICD-10-CM | POA: Insufficient documentation

## 2017-11-07 DIAGNOSIS — R519 Headache, unspecified: Secondary | ICD-10-CM

## 2017-11-07 DIAGNOSIS — Z79899 Other long term (current) drug therapy: Secondary | ICD-10-CM | POA: Diagnosis not present

## 2017-11-07 DIAGNOSIS — J449 Chronic obstructive pulmonary disease, unspecified: Secondary | ICD-10-CM | POA: Insufficient documentation

## 2017-11-07 DIAGNOSIS — R0602 Shortness of breath: Secondary | ICD-10-CM | POA: Diagnosis not present

## 2017-11-07 DIAGNOSIS — R062 Wheezing: Secondary | ICD-10-CM | POA: Diagnosis not present

## 2017-11-07 DIAGNOSIS — Z760 Encounter for issue of repeat prescription: Secondary | ICD-10-CM | POA: Diagnosis not present

## 2017-11-07 MED ORDER — PROCHLORPERAZINE MALEATE 5 MG PO TABS
10.0000 mg | ORAL_TABLET | Freq: Four times a day (QID) | ORAL | Status: DC | PRN
  Administered 2017-11-07: 10 mg via ORAL
  Filled 2017-11-07: qty 2

## 2017-11-07 MED ORDER — ALBUTEROL SULFATE HFA 108 (90 BASE) MCG/ACT IN AERS
2.0000 | INHALATION_SPRAY | Freq: Once | RESPIRATORY_TRACT | Status: AC
  Administered 2017-11-07: 2 via RESPIRATORY_TRACT
  Filled 2017-11-07: qty 6.7

## 2017-11-07 MED ORDER — HYDROCODONE-ACETAMINOPHEN 5-325 MG PO TABS
2.0000 | ORAL_TABLET | Freq: Once | ORAL | Status: AC
  Administered 2017-11-07: 2 via ORAL
  Filled 2017-11-07: qty 2

## 2017-11-07 NOTE — ED Notes (Signed)
Pt not in room when registration in to assess.

## 2017-11-07 NOTE — ED Notes (Signed)
Pt has come out of room several times (upset) asking about when will he be seen.  Each time pt is attempting to leave door to room opened.  Informed that d/t pt privacy, the door will need to be closed.  Security informed.
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
## 2017-11-07 NOTE — Telephone Encounter (Signed)
Pt is aware of the appt in March and he will call if there are problems.

## 2017-11-07 NOTE — ED Notes (Signed)
Pt not in room.

## 2017-11-07 NOTE — ED Triage Notes (Signed)
Patient states he cannot get a inhaler until his insurance goes into effect in Feb. Reports he needs an inhaler now.

## 2017-11-07 NOTE — ED Notes (Signed)
Pt has come out of room requesting medications for headache and to see somebody.  Informed that PA will be with him shortly, and he was seeing pts in order.

## 2017-12-04 ENCOUNTER — Ambulatory Visit (HOSPITAL_COMMUNITY)
Admission: RE | Admit: 2017-12-04 | Discharge: 2017-12-04 | Disposition: A | Discharge status: Home / Self Care | Payer: Self-pay | Source: Ambulatory Visit | Attending: Internal Medicine | Admitting: Internal Medicine

## 2017-12-04 ENCOUNTER — Other Ambulatory Visit (HOSPITAL_COMMUNITY): Payer: Self-pay | Admitting: Internal Medicine

## 2017-12-04 DIAGNOSIS — R059 Cough, unspecified: Secondary | ICD-10-CM

## 2017-12-04 DIAGNOSIS — J441 Chronic obstructive pulmonary disease with (acute) exacerbation: Secondary | ICD-10-CM | POA: Diagnosis not present

## 2017-12-04 DIAGNOSIS — R05 Cough: Secondary | ICD-10-CM

## 2017-12-04 DIAGNOSIS — J449 Chronic obstructive pulmonary disease, unspecified: Secondary | ICD-10-CM | POA: Insufficient documentation

## 2017-12-04 DIAGNOSIS — R918 Other nonspecific abnormal finding of lung field: Secondary | ICD-10-CM | POA: Insufficient documentation

## 2017-12-04 DIAGNOSIS — Z0000 Encounter for general adult medical examination without abnormal findings: Secondary | ICD-10-CM | POA: Diagnosis not present

## 2017-12-04 DIAGNOSIS — R531 Weakness: Secondary | ICD-10-CM | POA: Diagnosis not present

## 2017-12-04 DIAGNOSIS — C61 Malignant neoplasm of prostate: Secondary | ICD-10-CM | POA: Diagnosis not present

## 2017-12-04 DIAGNOSIS — Z79899 Other long term (current) drug therapy: Secondary | ICD-10-CM | POA: Diagnosis not present

## 2017-12-08 ENCOUNTER — Other Ambulatory Visit (HOSPITAL_COMMUNITY): Payer: Self-pay | Admitting: Internal Medicine

## 2017-12-08 DIAGNOSIS — R9389 Abnormal findings on diagnostic imaging of other specified body structures: Secondary | ICD-10-CM

## 2017-12-08 DIAGNOSIS — R911 Solitary pulmonary nodule: Secondary | ICD-10-CM | POA: Diagnosis not present

## 2017-12-08 DIAGNOSIS — J441 Chronic obstructive pulmonary disease with (acute) exacerbation: Secondary | ICD-10-CM | POA: Diagnosis not present

## 2017-12-14 ENCOUNTER — Ambulatory Visit (HOSPITAL_COMMUNITY)
Admission: RE | Admit: 2017-12-14 | Discharge: 2017-12-14 | Disposition: A | Discharge status: Home / Self Care | Payer: PPO | Source: Ambulatory Visit | Attending: Internal Medicine | Admitting: Internal Medicine

## 2017-12-14 ENCOUNTER — Encounter (HOSPITAL_COMMUNITY): Payer: Self-pay

## 2017-12-20 ENCOUNTER — Telehealth: Payer: Self-pay | Admitting: Gastroenterology

## 2017-12-20 ENCOUNTER — Ambulatory Visit: Payer: Self-pay | Admitting: Gastroenterology

## 2017-12-20 ENCOUNTER — Encounter: Payer: Self-pay | Admitting: Gastroenterology

## 2017-12-20 NOTE — Telephone Encounter (Signed)
PATIENT WAS A NO SHOW AND LETTER SENT  °

## 2017-12-24 DIAGNOSIS — Z590 Homelessness: Secondary | ICD-10-CM | POA: Diagnosis not present

## 2017-12-24 DIAGNOSIS — J9600 Acute respiratory failure, unspecified whether with hypoxia or hypercapnia: Secondary | ICD-10-CM | POA: Diagnosis not present

## 2017-12-24 DIAGNOSIS — J9622 Acute and chronic respiratory failure with hypercapnia: Secondary | ICD-10-CM | POA: Diagnosis not present

## 2017-12-24 DIAGNOSIS — R069 Unspecified abnormalities of breathing: Secondary | ICD-10-CM | POA: Diagnosis not present

## 2017-12-24 DIAGNOSIS — J441 Chronic obstructive pulmonary disease with (acute) exacerbation: Secondary | ICD-10-CM | POA: Diagnosis not present

## 2017-12-24 DIAGNOSIS — Z9911 Dependence on respirator [ventilator] status: Secondary | ICD-10-CM | POA: Diagnosis not present

## 2017-12-24 DIAGNOSIS — R0602 Shortness of breath: Secondary | ICD-10-CM | POA: Diagnosis not present

## 2017-12-24 DIAGNOSIS — J9602 Acute respiratory failure with hypercapnia: Secondary | ICD-10-CM | POA: Diagnosis not present

## 2017-12-24 DIAGNOSIS — R0902 Hypoxemia: Secondary | ICD-10-CM | POA: Diagnosis not present

## 2017-12-24 DIAGNOSIS — R0682 Tachypnea, not elsewhere classified: Secondary | ICD-10-CM | POA: Diagnosis not present

## 2017-12-24 DIAGNOSIS — E872 Acidosis: Secondary | ICD-10-CM | POA: Diagnosis not present

## 2017-12-24 DIAGNOSIS — R918 Other nonspecific abnormal finding of lung field: Secondary | ICD-10-CM | POA: Diagnosis not present

## 2017-12-24 DIAGNOSIS — Z79899 Other long term (current) drug therapy: Secondary | ICD-10-CM | POA: Diagnosis not present

## 2017-12-24 DIAGNOSIS — J9621 Acute and chronic respiratory failure with hypoxia: Secondary | ICD-10-CM | POA: Diagnosis not present

## 2017-12-24 DIAGNOSIS — F17290 Nicotine dependence, other tobacco product, uncomplicated: Secondary | ICD-10-CM | POA: Diagnosis not present

## 2017-12-24 DIAGNOSIS — J90 Pleural effusion, not elsewhere classified: Secondary | ICD-10-CM | POA: Diagnosis not present

## 2017-12-26 DIAGNOSIS — J449 Chronic obstructive pulmonary disease, unspecified: Secondary | ICD-10-CM | POA: Insufficient documentation

## 2018-01-02 ENCOUNTER — Other Ambulatory Visit: Payer: Self-pay

## 2018-01-02 NOTE — Patient Outreach (Signed)
[facility]) Care Management  01/02/2018  Jerome Daniel 12/26/1960 [id number]   Referral Date: 01/02/18 Referral Source: HTA report Date of Admission: ? Diagnosis: ? Date of Discharge: 12/28/17 Facility: [place]: HTA  Outreach attempt # 1 Telephone call to patient. No answer. HIPAA compliant voice message left.  Plan: RN CM will send letter and attempt patient again within 4 business days.   Jone Baseman, RN, MSN [facility] Care Management Care Management Coordinator Direct Line [phone] Toll Free: [phone]  Fax: [phone]

## 2018-01-03 DIAGNOSIS — J441 Chronic obstructive pulmonary disease with (acute) exacerbation: Secondary | ICD-10-CM | POA: Diagnosis not present

## 2018-01-03 DIAGNOSIS — Z87891 Personal history of nicotine dependence: Secondary | ICD-10-CM | POA: Diagnosis not present

## 2018-01-03 DIAGNOSIS — Z23 Encounter for immunization: Secondary | ICD-10-CM | POA: Diagnosis not present

## 2018-01-03 DIAGNOSIS — K5900 Constipation, unspecified: Secondary | ICD-10-CM | POA: Diagnosis not present

## 2018-01-04 ENCOUNTER — Other Ambulatory Visit: Payer: Self-pay

## 2018-01-04 NOTE — Patient Outreach (Addendum)
[facility]) Care Management  01/04/2018  Jerome Daniel January 12, 1961 [id number]   Referral Date: 01/02/18 Referral Source: HTA report Date of Admission: ? Diagnosis: ? Date of Discharge: 12/28/17 Facility: [place]: HTA  Outreach attempt # 2 Telephone call to patient. No answer. HIPAA compliant voice message left.  Plan: RN CM will attempt patient again within 4 business days.  Jerome Baseman, RN, MSN [facility] Care Management Care Management Coordinator Direct Line [phone] Toll Free: [phone]  Fax: [phone]

## 2018-01-05 ENCOUNTER — Other Ambulatory Visit: Payer: Self-pay

## 2018-01-05 NOTE — Patient Outreach (Signed)
[facility]) Care Management  01/05/2018  Jerome Daniel [birth year] [id number]   Referral Date:01/02/18 Referral Source:HTA report Date of Admission:? Diagnosis:? Date of Discharge:12/28/17 Facility:[place]  Outreach attempt # 3 Telephone call to patient. No answer. HIPAA compliant voice message left.  Plan: RN CM willwait patient return call .  If no response will close case.  Jone Baseman, RN, MSN [facility] Care Management Care Management Coordinator Direct Line [phone] Toll Free: [phone]  Fax: [phone]

## 2018-01-16 ENCOUNTER — Other Ambulatory Visit: Payer: Self-pay

## 2018-01-16 NOTE — Patient Outreach (Signed)
[place] [facility]) Care Management  01/16/2018  Jerome Daniel Mar 07, 1961 [id number]   Multiple attempts to establish contact with patient without success. No response from letter mailed to patient. Case is being closed at this time.   Jone Baseman, RN, MSN [facility] Care Management Care Management Coordinator Direct Line [phone] Toll Free: [phone]  Fax: [phone]

## 2018-01-23 IMAGING — CR DG CHEST 1V PORT
1 series · 1 of 1 positions shown · non-contrast
Comparison: No recent prior .

CLINICAL DATA: Fever and cough.

EXAM:
PORTABLE CHEST 1 VIEW

[ap portable]
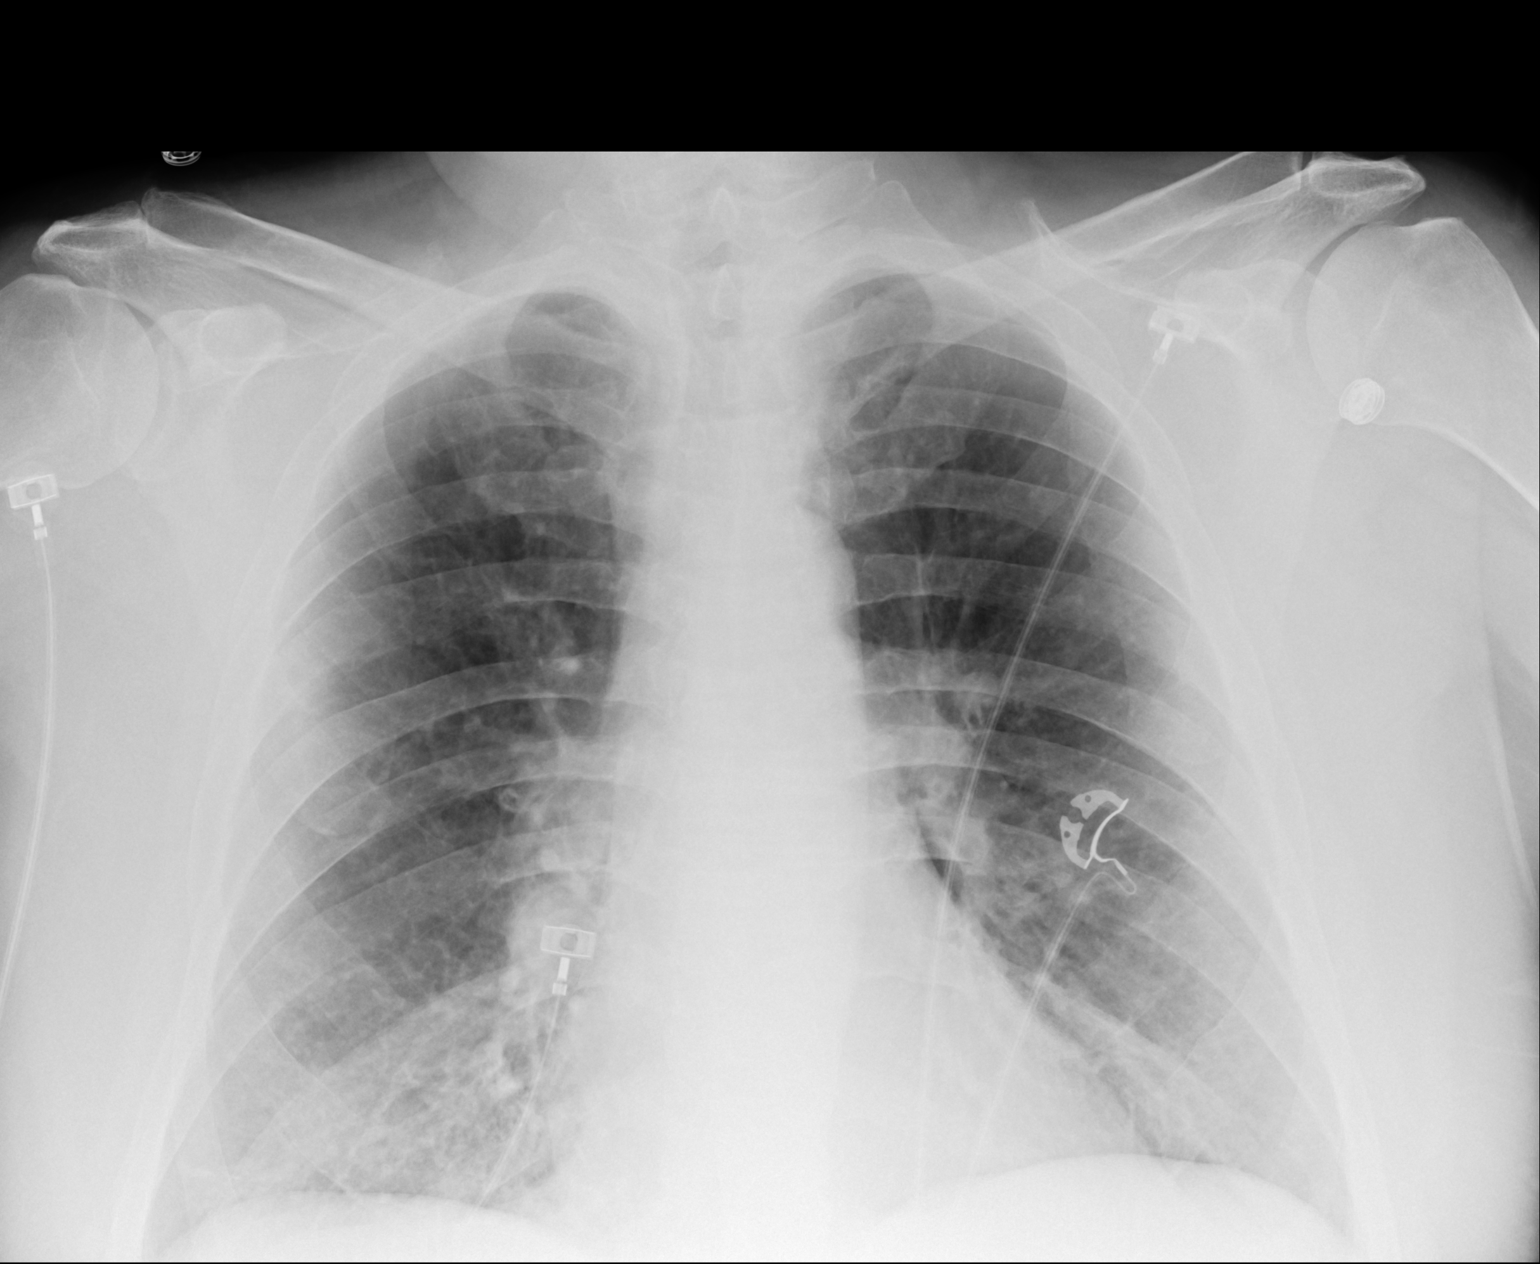

[1 of 1 positions shown; findings below may reference images not displayed]

FINDINGS: Mediastinum hilar structures normal. Cardiomegaly with normal
pulmonary vascularity. Low lung volumes with basilar atelectasis.
Right lower lobe infiltrate. No pleural effusion or pneumothorax .
IMPRESSION: 1. Right lower lobe infiltrate consistent with pneumonia.

2. Low lung volumes with by base atelectasis .

## 2018-02-18 DIAGNOSIS — R069 Unspecified abnormalities of breathing: Secondary | ICD-10-CM | POA: Diagnosis not present

## 2018-02-18 DIAGNOSIS — Z87891 Personal history of nicotine dependence: Secondary | ICD-10-CM | POA: Diagnosis not present

## 2018-02-18 DIAGNOSIS — R093 Abnormal sputum: Secondary | ICD-10-CM | POA: Diagnosis not present

## 2018-02-18 DIAGNOSIS — J441 Chronic obstructive pulmonary disease with (acute) exacerbation: Secondary | ICD-10-CM | POA: Diagnosis not present

## 2018-02-18 DIAGNOSIS — R0602 Shortness of breath: Secondary | ICD-10-CM | POA: Diagnosis not present

## 2018-04-24 DIAGNOSIS — R0602 Shortness of breath: Secondary | ICD-10-CM | POA: Diagnosis not present

## 2018-04-24 DIAGNOSIS — J9801 Acute bronchospasm: Secondary | ICD-10-CM | POA: Diagnosis not present

## 2018-04-24 DIAGNOSIS — R9389 Abnormal findings on diagnostic imaging of other specified body structures: Secondary | ICD-10-CM | POA: Diagnosis not present

## 2018-04-24 DIAGNOSIS — Z87891 Personal history of nicotine dependence: Secondary | ICD-10-CM | POA: Diagnosis not present

## 2018-04-24 DIAGNOSIS — J449 Chronic obstructive pulmonary disease, unspecified: Secondary | ICD-10-CM | POA: Diagnosis not present

## 2018-04-24 DIAGNOSIS — R069 Unspecified abnormalities of breathing: Secondary | ICD-10-CM | POA: Diagnosis not present

## 2018-04-24 DIAGNOSIS — R918 Other nonspecific abnormal finding of lung field: Secondary | ICD-10-CM | POA: Diagnosis not present

## 2018-04-24 DIAGNOSIS — R0902 Hypoxemia: Secondary | ICD-10-CM | POA: Diagnosis not present

## 2018-04-24 DIAGNOSIS — Z760 Encounter for issue of repeat prescription: Secondary | ICD-10-CM | POA: Diagnosis not present

## 2018-05-05 DIAGNOSIS — J441 Chronic obstructive pulmonary disease with (acute) exacerbation: Secondary | ICD-10-CM | POA: Diagnosis not present

## 2018-05-05 DIAGNOSIS — R0682 Tachypnea, not elsewhere classified: Secondary | ICD-10-CM | POA: Diagnosis not present

## 2018-05-05 DIAGNOSIS — E872 Acidosis: Secondary | ICD-10-CM | POA: Diagnosis not present

## 2018-05-05 DIAGNOSIS — Z79899 Other long term (current) drug therapy: Secondary | ICD-10-CM | POA: Diagnosis not present

## 2018-05-05 DIAGNOSIS — R0602 Shortness of breath: Secondary | ICD-10-CM | POA: Diagnosis not present

## 2018-05-05 DIAGNOSIS — R05 Cough: Secondary | ICD-10-CM | POA: Diagnosis not present

## 2018-05-05 DIAGNOSIS — R0603 Acute respiratory distress: Secondary | ICD-10-CM | POA: Diagnosis not present

## 2018-05-05 DIAGNOSIS — Z87891 Personal history of nicotine dependence: Secondary | ICD-10-CM | POA: Diagnosis not present

## 2018-05-05 DIAGNOSIS — I10 Essential (primary) hypertension: Secondary | ICD-10-CM | POA: Diagnosis not present

## 2018-05-06 DIAGNOSIS — R0602 Shortness of breath: Secondary | ICD-10-CM | POA: Diagnosis not present

## 2018-05-06 DIAGNOSIS — J441 Chronic obstructive pulmonary disease with (acute) exacerbation: Secondary | ICD-10-CM | POA: Diagnosis present

## 2018-05-06 DIAGNOSIS — Z79899 Other long term (current) drug therapy: Secondary | ICD-10-CM | POA: Diagnosis not present

## 2018-05-06 DIAGNOSIS — Z87891 Personal history of nicotine dependence: Secondary | ICD-10-CM | POA: Diagnosis not present

## 2018-05-06 DIAGNOSIS — I10 Essential (primary) hypertension: Secondary | ICD-10-CM | POA: Diagnosis present

## 2018-05-06 DIAGNOSIS — R0603 Acute respiratory distress: Secondary | ICD-10-CM | POA: Diagnosis present

## 2018-05-16 DIAGNOSIS — J441 Chronic obstructive pulmonary disease with (acute) exacerbation: Secondary | ICD-10-CM | POA: Diagnosis not present

## 2018-05-16 DIAGNOSIS — Z87891 Personal history of nicotine dependence: Secondary | ICD-10-CM | POA: Diagnosis not present

## 2018-05-16 DIAGNOSIS — K5900 Constipation, unspecified: Secondary | ICD-10-CM | POA: Diagnosis not present

## 2018-06-13 DIAGNOSIS — Z87891 Personal history of nicotine dependence: Secondary | ICD-10-CM | POA: Diagnosis not present

## 2018-06-13 DIAGNOSIS — J441 Chronic obstructive pulmonary disease with (acute) exacerbation: Secondary | ICD-10-CM | POA: Diagnosis not present

## 2018-06-28 DIAGNOSIS — R05 Cough: Secondary | ICD-10-CM | POA: Diagnosis not present

## 2018-06-28 DIAGNOSIS — R0602 Shortness of breath: Secondary | ICD-10-CM | POA: Diagnosis not present

## 2018-06-28 DIAGNOSIS — J441 Chronic obstructive pulmonary disease with (acute) exacerbation: Secondary | ICD-10-CM | POA: Diagnosis not present

## 2018-06-28 DIAGNOSIS — R918 Other nonspecific abnormal finding of lung field: Secondary | ICD-10-CM | POA: Diagnosis not present

## 2018-06-28 DIAGNOSIS — Z87891 Personal history of nicotine dependence: Secondary | ICD-10-CM | POA: Diagnosis not present

## 2018-07-04 DIAGNOSIS — J441 Chronic obstructive pulmonary disease with (acute) exacerbation: Secondary | ICD-10-CM | POA: Diagnosis not present

## 2018-07-04 DIAGNOSIS — R911 Solitary pulmonary nodule: Secondary | ICD-10-CM | POA: Diagnosis not present

## 2018-07-04 DIAGNOSIS — R590 Localized enlarged lymph nodes: Secondary | ICD-10-CM | POA: Diagnosis not present

## 2018-07-10 DIAGNOSIS — R911 Solitary pulmonary nodule: Secondary | ICD-10-CM | POA: Diagnosis not present

## 2018-07-18 DIAGNOSIS — R918 Other nonspecific abnormal finding of lung field: Secondary | ICD-10-CM | POA: Diagnosis not present

## 2018-07-18 DIAGNOSIS — R911 Solitary pulmonary nodule: Secondary | ICD-10-CM | POA: Diagnosis not present

## 2018-07-28 DIAGNOSIS — R918 Other nonspecific abnormal finding of lung field: Secondary | ICD-10-CM | POA: Insufficient documentation

## 2018-07-28 DIAGNOSIS — R911 Solitary pulmonary nodule: Secondary | ICD-10-CM | POA: Insufficient documentation

## 2018-08-03 DIAGNOSIS — R918 Other nonspecific abnormal finding of lung field: Secondary | ICD-10-CM | POA: Diagnosis not present

## 2018-08-03 DIAGNOSIS — R911 Solitary pulmonary nodule: Secondary | ICD-10-CM | POA: Diagnosis not present

## 2018-08-03 DIAGNOSIS — J984 Other disorders of lung: Secondary | ICD-10-CM | POA: Diagnosis not present

## 2018-08-08 DIAGNOSIS — J449 Chronic obstructive pulmonary disease, unspecified: Secondary | ICD-10-CM | POA: Diagnosis not present

## 2018-08-08 DIAGNOSIS — R911 Solitary pulmonary nodule: Secondary | ICD-10-CM | POA: Diagnosis not present

## 2018-08-14 DIAGNOSIS — J189 Pneumonia, unspecified organism: Secondary | ICD-10-CM | POA: Diagnosis not present

## 2018-08-14 DIAGNOSIS — R911 Solitary pulmonary nodule: Secondary | ICD-10-CM | POA: Diagnosis not present

## 2018-08-14 DIAGNOSIS — J449 Chronic obstructive pulmonary disease, unspecified: Secondary | ICD-10-CM | POA: Diagnosis not present

## 2018-08-14 DIAGNOSIS — R918 Other nonspecific abnormal finding of lung field: Secondary | ICD-10-CM | POA: Diagnosis not present

## 2018-08-14 DIAGNOSIS — F17220 Nicotine dependence, chewing tobacco, uncomplicated: Secondary | ICD-10-CM | POA: Diagnosis not present

## 2018-08-14 DIAGNOSIS — J209 Acute bronchitis, unspecified: Secondary | ICD-10-CM | POA: Diagnosis not present

## 2018-08-21 DIAGNOSIS — R2241 Localized swelling, mass and lump, right lower limb: Secondary | ICD-10-CM | POA: Diagnosis not present

## 2018-08-21 DIAGNOSIS — R0602 Shortness of breath: Secondary | ICD-10-CM | POA: Diagnosis not present

## 2018-08-21 DIAGNOSIS — R911 Solitary pulmonary nodule: Secondary | ICD-10-CM | POA: Diagnosis not present

## 2018-08-21 DIAGNOSIS — Z87891 Personal history of nicotine dependence: Secondary | ICD-10-CM | POA: Diagnosis not present

## 2018-08-21 DIAGNOSIS — J984 Other disorders of lung: Secondary | ICD-10-CM | POA: Diagnosis not present

## 2018-08-21 DIAGNOSIS — J449 Chronic obstructive pulmonary disease, unspecified: Secondary | ICD-10-CM | POA: Diagnosis not present

## 2018-08-22 DIAGNOSIS — R2241 Localized swelling, mass and lump, right lower limb: Secondary | ICD-10-CM | POA: Diagnosis not present

## 2018-09-19 DIAGNOSIS — R911 Solitary pulmonary nodule: Secondary | ICD-10-CM | POA: Diagnosis not present

## 2018-09-19 DIAGNOSIS — J82 Pulmonary eosinophilia, not elsewhere classified: Secondary | ICD-10-CM | POA: Diagnosis not present

## 2018-09-21 DIAGNOSIS — R918 Other nonspecific abnormal finding of lung field: Secondary | ICD-10-CM | POA: Diagnosis not present

## 2018-09-21 DIAGNOSIS — R590 Localized enlarged lymph nodes: Secondary | ICD-10-CM | POA: Diagnosis not present

## 2018-09-21 DIAGNOSIS — R0602 Shortness of breath: Secondary | ICD-10-CM | POA: Diagnosis not present

## 2018-09-26 DIAGNOSIS — Z0000 Encounter for general adult medical examination without abnormal findings: Secondary | ICD-10-CM | POA: Diagnosis not present

## 2018-09-26 DIAGNOSIS — M7989 Other specified soft tissue disorders: Secondary | ICD-10-CM | POA: Diagnosis not present

## 2018-09-26 DIAGNOSIS — Z87891 Personal history of nicotine dependence: Secondary | ICD-10-CM | POA: Diagnosis not present

## 2018-09-26 DIAGNOSIS — J449 Chronic obstructive pulmonary disease, unspecified: Secondary | ICD-10-CM | POA: Diagnosis not present

## 2018-10-24 DIAGNOSIS — J449 Chronic obstructive pulmonary disease, unspecified: Secondary | ICD-10-CM | POA: Diagnosis not present

## 2018-10-24 DIAGNOSIS — Z87891 Personal history of nicotine dependence: Secondary | ICD-10-CM | POA: Diagnosis not present

## 2018-10-24 DIAGNOSIS — J40 Bronchitis, not specified as acute or chronic: Secondary | ICD-10-CM | POA: Diagnosis not present

## 2018-10-24 DIAGNOSIS — R918 Other nonspecific abnormal finding of lung field: Secondary | ICD-10-CM | POA: Diagnosis not present

## 2018-10-24 DIAGNOSIS — R0602 Shortness of breath: Secondary | ICD-10-CM | POA: Diagnosis not present

## 2018-11-18 DIAGNOSIS — J441 Chronic obstructive pulmonary disease with (acute) exacerbation: Secondary | ICD-10-CM | POA: Diagnosis not present

## 2018-11-18 DIAGNOSIS — R0602 Shortness of breath: Secondary | ICD-10-CM | POA: Diagnosis not present

## 2018-11-18 DIAGNOSIS — R918 Other nonspecific abnormal finding of lung field: Secondary | ICD-10-CM | POA: Diagnosis not present

## 2018-11-18 DIAGNOSIS — Z87891 Personal history of nicotine dependence: Secondary | ICD-10-CM | POA: Diagnosis not present

## 2018-11-29 DIAGNOSIS — Z87891 Personal history of nicotine dependence: Secondary | ICD-10-CM | POA: Diagnosis not present

## 2018-11-29 DIAGNOSIS — R0602 Shortness of breath: Secondary | ICD-10-CM | POA: Diagnosis not present

## 2018-11-29 DIAGNOSIS — J449 Chronic obstructive pulmonary disease, unspecified: Secondary | ICD-10-CM | POA: Diagnosis not present

## 2018-12-13 DIAGNOSIS — J479 Bronchiectasis, uncomplicated: Secondary | ICD-10-CM | POA: Diagnosis not present

## 2018-12-13 DIAGNOSIS — R918 Other nonspecific abnormal finding of lung field: Secondary | ICD-10-CM | POA: Diagnosis not present

## 2018-12-13 DIAGNOSIS — R0602 Shortness of breath: Secondary | ICD-10-CM | POA: Diagnosis not present

## 2018-12-20 DIAGNOSIS — T50904A Poisoning by unspecified drugs, medicaments and biological substances, undetermined, initial encounter: Secondary | ICD-10-CM | POA: Diagnosis not present

## 2018-12-20 DIAGNOSIS — R0902 Hypoxemia: Secondary | ICD-10-CM | POA: Diagnosis not present

## 2018-12-20 DIAGNOSIS — R069 Unspecified abnormalities of breathing: Secondary | ICD-10-CM | POA: Diagnosis not present

## 2018-12-20 DIAGNOSIS — Z7952 Long term (current) use of systemic steroids: Secondary | ICD-10-CM | POA: Diagnosis not present

## 2018-12-20 DIAGNOSIS — F17210 Nicotine dependence, cigarettes, uncomplicated: Secondary | ICD-10-CM | POA: Diagnosis not present

## 2018-12-20 DIAGNOSIS — I213 ST elevation (STEMI) myocardial infarction of unspecified site: Secondary | ICD-10-CM | POA: Diagnosis not present

## 2018-12-20 DIAGNOSIS — J80 Acute respiratory distress syndrome: Secondary | ICD-10-CM | POA: Diagnosis not present

## 2018-12-20 DIAGNOSIS — Z87891 Personal history of nicotine dependence: Secondary | ICD-10-CM | POA: Diagnosis not present

## 2018-12-20 DIAGNOSIS — J441 Chronic obstructive pulmonary disease with (acute) exacerbation: Secondary | ICD-10-CM | POA: Diagnosis not present

## 2018-12-20 DIAGNOSIS — Z8701 Personal history of pneumonia (recurrent): Secondary | ICD-10-CM | POA: Diagnosis not present

## 2018-12-23 DIAGNOSIS — J441 Chronic obstructive pulmonary disease with (acute) exacerbation: Secondary | ICD-10-CM | POA: Diagnosis not present

## 2019-03-28 DIAGNOSIS — J984 Other disorders of lung: Secondary | ICD-10-CM | POA: Insufficient documentation

## 2019-10-07 DIAGNOSIS — S301XXA Contusion of abdominal wall, initial encounter: Secondary | ICD-10-CM | POA: Insufficient documentation
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
## 2020-06-05 ENCOUNTER — Ambulatory Visit: Payer: PPO | Admitting: Urology

## 2020-07-21 ENCOUNTER — Ambulatory Visit: Payer: Medicare HMO | Admitting: Urology

## 2021-06-09 DIAGNOSIS — H2589 Other age-related cataract: Secondary | ICD-10-CM | POA: Diagnosis not present

## 2021-06-09 DIAGNOSIS — H2511 Age-related nuclear cataract, right eye: Secondary | ICD-10-CM | POA: Diagnosis not present

## 2021-06-09 DIAGNOSIS — H2512 Age-related nuclear cataract, left eye: Secondary | ICD-10-CM | POA: Diagnosis not present

## 2021-07-06 DIAGNOSIS — Z6828 Body mass index (BMI) 28.0-28.9, adult: Secondary | ICD-10-CM | POA: Diagnosis not present

## 2021-07-06 DIAGNOSIS — M5442 Lumbago with sciatica, left side: Secondary | ICD-10-CM | POA: Diagnosis not present

## 2021-07-06 DIAGNOSIS — Z0000 Encounter for general adult medical examination without abnormal findings: Secondary | ICD-10-CM | POA: Diagnosis not present

## 2021-07-06 DIAGNOSIS — Z13220 Encounter for screening for lipoid disorders: Secondary | ICD-10-CM | POA: Diagnosis not present

## 2021-07-06 DIAGNOSIS — J449 Chronic obstructive pulmonary disease, unspecified: Secondary | ICD-10-CM | POA: Diagnosis not present

## 2021-07-12 ENCOUNTER — Encounter: Payer: Self-pay | Admitting: *Deleted

## 2021-07-21 DIAGNOSIS — F1020 Alcohol dependence, uncomplicated: Secondary | ICD-10-CM | POA: Diagnosis not present

## 2021-08-12 ENCOUNTER — Ambulatory Visit: Payer: Medicare HMO

## 2021-09-06 ENCOUNTER — Ambulatory Visit: Payer: Medicare HMO | Admitting: Urology

## 2021-10-13 DIAGNOSIS — J449 Chronic obstructive pulmonary disease, unspecified: Secondary | ICD-10-CM | POA: Diagnosis not present

## 2021-10-13 DIAGNOSIS — Z6832 Body mass index (BMI) 32.0-32.9, adult: Secondary | ICD-10-CM | POA: Diagnosis not present

## 2021-10-13 DIAGNOSIS — M5442 Lumbago with sciatica, left side: Secondary | ICD-10-CM | POA: Diagnosis not present

## 2021-10-25 ENCOUNTER — Ambulatory Visit (INDEPENDENT_AMBULATORY_CARE_PROVIDER_SITE_OTHER): Payer: Medicare HMO | Admitting: Urology

## 2021-10-25 ENCOUNTER — Other Ambulatory Visit: Payer: Self-pay

## 2021-10-25 ENCOUNTER — Encounter: Payer: Self-pay | Admitting: Urology

## 2021-10-25 VITALS — BP 146/80 | HR 105 | Ht 68.0 in | Wt 220.0 lb

## 2021-10-25 DIAGNOSIS — R972 Elevated prostate specific antigen [PSA]: Secondary | ICD-10-CM

## 2021-10-25 LAB — URINALYSIS, ROUTINE W REFLEX MICROSCOPIC
Bilirubin, UA: NEGATIVE
Glucose, UA: NEGATIVE
Ketones, UA: NEGATIVE
Nitrite, UA: NEGATIVE
Protein,UA: NEGATIVE
RBC, UA: NEGATIVE
Specific Gravity, UA: 1.02 (ref 1.005–1.030)
Urobilinogen, Ur: 0.2 mg/dL (ref 0.2–1.0)
pH, UA: 7 (ref 5.0–7.5)

## 2021-10-25 LAB — MICROSCOPIC EXAMINATION
RBC, Urine: NONE SEEN /hpf (ref 0–2)
Renal Epithel, UA: NONE SEEN /hpf

## 2021-10-25 NOTE — Addendum Note (Signed)
Addended by: Dorisann Frames on: 10/25/2021 01:44 PM   Modules accepted: Orders

## 2021-10-25 NOTE — Progress Notes (Signed)
post void residual= 59  Urological Symptom Review  Patient is experiencing the following symptoms: Frequent urination Hard to postpone urination   Review of Systems  Gastrointestinal (upper)  : Negative for upper GI symptoms  Gastrointestinal (lower) : Negative for lower GI symptoms  Constitutional : Negative for symptoms  Skin: Negative for skin symptoms  Eyes: Negative for eye symptoms  Ear/Nose/Throat : Negative for Ear/Nose/Throat symptoms  Hematologic/Lymphatic: Negative for Hematologic/Lymphatic symptoms  Cardiovascular : Negative for cardiovascular symptoms  Respiratory : Negative for respiratory symptoms  Endocrine: Negative for endocrine symptoms  Musculoskeletal: Negative for musculoskeletal symptoms  Neurological: Negative for neurological symptoms  Psychologic: Negative for psychiatric symptoms

## 2021-10-25 NOTE — Patient Instructions (Signed)
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
## 2021-10-27 LAB — PSA: Prostate Specific Ag, Serum: 12.5 ng/mL — ABNORMAL HIGH (ref 0.0–4.0)

## 2021-11-02 ENCOUNTER — Telehealth: Payer: Self-pay

## 2021-11-02 DIAGNOSIS — R972 Elevated prostate specific antigen [PSA]: Secondary | ICD-10-CM

## 2021-11-02 MED ORDER — LEVOFLOXACIN 750 MG PO TABS: 750.0000 mg | ORAL_TABLET | Freq: Once | ORAL | 0 refills | Status: AC

## 2021-11-02 NOTE — Telephone Encounter (Signed)
Reviewed biopsy instructions with patient and mailed to patient as well.  Voiced understanding.

## 2021-11-02 NOTE — Telephone Encounter (Signed)
-----   Message from Cleon Gustin, MD sent at 10/28/2021  8:30 AM EST ----- He needs a prostate biopsy ----- Message ----- From: Dorisann Frames, RN Sent: 10/27/2021   9:57 AM EST To: Cleon Gustin, MD  Please review

## 2021-11-08 NOTE — Telephone Encounter (Signed)
Left a voicemail:  Patient's [place] wanting to ask some questions about a letter patient  received regarding biopsy.  Please call her back at [phone].  Thanks, Helene Kelp

## 2021-11-09 NOTE — Telephone Encounter (Signed)
Patient had came to office yesterday and confirmed biopsy appts.

## 2021-11-17 ENCOUNTER — Ambulatory Visit (INDEPENDENT_AMBULATORY_CARE_PROVIDER_SITE_OTHER): Payer: Medicare HMO | Admitting: Urology

## 2021-11-17 ENCOUNTER — Other Ambulatory Visit: Payer: Self-pay

## 2021-11-17 ENCOUNTER — Encounter (HOSPITAL_COMMUNITY): Payer: Self-pay

## 2021-11-17 ENCOUNTER — Encounter: Payer: Self-pay | Admitting: Urology

## 2021-11-17 ENCOUNTER — Other Ambulatory Visit: Payer: Self-pay | Admitting: Urology

## 2021-11-17 ENCOUNTER — Ambulatory Visit (HOSPITAL_COMMUNITY)
Admission: RE | Admit: 2021-11-17 | Discharge: 2021-11-17 | Disposition: A | Discharge status: Home / Self Care | Payer: Medicare HMO | Source: Ambulatory Visit | Attending: Urology | Admitting: Urology

## 2021-11-17 DIAGNOSIS — C61 Malignant neoplasm of prostate: Secondary | ICD-10-CM

## 2021-11-17 DIAGNOSIS — R972 Elevated prostate specific antigen [PSA]: Secondary | ICD-10-CM | POA: Insufficient documentation

## 2021-11-17 MED ORDER — LIDOCAINE HCL (PF) 2 % IJ SOLN
INTRAMUSCULAR | Status: AC
  Administered 2021-11-17: 10 mL
  Filled 2021-11-17: qty 10

## 2021-11-17 MED ORDER — LIDOCAINE HCL (PF) 2 % IJ SOLN: 10.0000 mL | Freq: Once | INTRAMUSCULAR | Status: AC

## 2021-11-17 MED ORDER — GENTAMICIN SULFATE 40 MG/ML IJ SOLN: 80.0000 mg | Freq: Once | INTRAMUSCULAR | Status: AC

## 2021-11-17 MED ORDER — GENTAMICIN SULFATE 40 MG/ML IJ SOLN
INTRAMUSCULAR | Status: AC
  Administered 2021-11-17: 80 mg via INTRAMUSCULAR
  Filled 2021-11-17: qty 2

## 2021-11-17 NOTE — Progress Notes (Signed)
Prostate Biopsy Procedure   Informed consent was obtained after discussing risks/benefits of the procedure.  A time out was performed to ensure correct patient identity.  Pre-Procedure: - Last PSA Level: No results found for: PSA - Gentamicin given prophylactically - Levaquin 500 mg administered PO -Transrectal Ultrasound performed revealing a 52.2gm prostate -No significant hypoechoic or median lobe noted  Procedure: - Prostate block performed using 10 cc 1% lidocaine and biopsies taken from sextant areas, a total of 12 under ultrasound guidance.  Post-Procedure: - Patient tolerated the procedure well - He was counseled to seek immediate medical attention if experiences any severe pain, significant bleeding, or fevers - Return in one week to discuss biopsy results

## 2021-11-17 NOTE — Progress Notes (Signed)
PT tolerated prostate biopsy procedure and antibiotic injection well today. Labs obtained and sent for pathology. PT ambulatory at discharge with no acute distress noted and verbalized understanding of discharge instructions. PT to follow up with urologist as scheduled on 11/24/21 at 3:45pm.

## 2021-11-17 NOTE — Patient Instructions (Signed)
Transrectal Ultrasound-Guided Prostate Biopsy, Care After What can I expect after the procedure? After the procedure, it is common to have: Pain and discomfort near your butt (rectum), especially while sitting. Pink-colored pee (urine). This is due to small amounts of blood in your pee. A burning feeling while peeing. Blood in your poop (stool). Bleeding from your butt. Blood in your semen. Follow these instructions at home: Medicines Take over-the-counter and prescription medicines only as told by your doctor. If you were given a sedative during your procedure, do not drive or use machines until your doctor says that it is safe. A sedative is a medicine that helps you relax. If you were prescribed an antibiotic medicine, take it as told by your doctor. Do not stop taking it even if you start to feel better. Activity  Return to your normal activities when your doctor says that it is safe. Ask your doctor when it is okay for you to have sex. You may have to avoid lifting. Ask your doctor how much you can safely lift. General instructions  Drink enough water to keep your pee pale yellow. Watch your pee, poop, and semen for new bleeding or bleeding that gets worse. Keep all follow-up visits. Contact a doctor if: You have any of these: Blood clots in your pee or poop. Blood in your pee more than 2 weeks after the procedure. Blood in your semen more than 2 months after the procedure. New or worse bleeding in your pee, poop, or semen. Very bad belly pain. Your pee smells bad or unusual. You have trouble peeing. Your lower belly feels firm. You have problems getting an erection. You feel like you may vomit (are nauseous), or you vomit. Get help right away if: You have a fever or chills. You have bright red pee. You have very bad pain that does not get better with medicine. You cannot pee. Summary After this procedure, it is common to have pain and discomfort near your butt,  especially while sitting. You may have blood in your pee and poop. It is common to have blood in your semen. Get help right away if you have a fever or chills. This information is not intended to replace advice given to you by your health care provider. Make sure you discuss any questions you have with your health care provider. Document Revised: 03/29/2021 Document Reviewed: 03/29/2021 Elsevier Patient Education  [place].

## 2021-11-24 ENCOUNTER — Ambulatory Visit (INDEPENDENT_AMBULATORY_CARE_PROVIDER_SITE_OTHER): Payer: Medicare HMO | Admitting: Urology

## 2021-11-24 ENCOUNTER — Encounter: Payer: Self-pay | Admitting: Urology

## 2021-11-24 ENCOUNTER — Other Ambulatory Visit: Payer: Self-pay

## 2021-11-24 VITALS — BP 113/68 | HR 76

## 2021-11-24 DIAGNOSIS — R972 Elevated prostate specific antigen [PSA]: Secondary | ICD-10-CM | POA: Diagnosis not present

## 2021-11-24 DIAGNOSIS — C61 Malignant neoplasm of prostate: Secondary | ICD-10-CM | POA: Diagnosis not present

## 2021-11-24 NOTE — Patient Instructions (Signed)
Prostate Cancer °The prostate is a small gland that helps make semen. It is located below a man's bladder, in front of the rectum. Prostate cancer is when abnormal cells grow in this gland. °What are the causes? °The cause of this condition is not known. °What increases the risk? °Being age 60 or older. °Having a family history of prostate cancer. °Having a family history of cancer of the breasts or ovaries. °Having genes that are passed from parent to child (inherited). °Having Lynch syndrome. °African American men and men of African descent are diagnosed with prostate cancer at higher rates than other men. °What are the signs or symptoms? °Problems peeing (urinating). This may include: °A stream that is weak, or pee that stops and starts. °Trouble starting or stopping your pee. °Trouble emptying all of your pee. °Needing to pee more often, especially at night. °Blood in your pee or semen. °Pain in the: °Lower back. °Lower belly (abdomen). °Hips. °Trouble getting an erection. °Weakness or numbness in the legs or feet. °How is this treated? °Treatment for this condition depends on: °How much the cancer has spread. °Your age. °The kind of treatment you want. °Your health. °Treatments include: °Being watched. This is called observation. You will be tested from time to time, but you will not get treated. Tests are to make sure that the cancer is not growing. °Surgery. This may be done to: °Take out (remove) the prostate. °Freeze and kill cancer cells. °Radiation. This uses a strong beam of energy to kill cancer cells. °Chemotherapy. This uses medicines that stop cancer cells from increasing. This kills cancer cells and healthy cells. °Targeted therapy. This kills cancer cells only. Healthy cells are not affected. °Hormone treatment. This stops the body from making hormones that help the cancer cells grow. °Follow these instructions at home: °Lifestyle °Do not smoke or use any products that contain nicotine or tobacco.  If you need help quitting, ask your doctor. °Eat a healthy diet. °Treatment may affect your ability to have sex. If you have a partner, touch, hold, hug, and caress your partner to have intimate moments. °Get plenty of sleep. °Ask your doctor for help to find a support group for men with prostate cancer. °General instructions °Take over-the-counter and prescription medicines only as told by your doctor. °If you have to go to the hospital, let your cancer doctor (oncologist) know. °Keep all follow-up visits. °Where to find more information °American Cancer Society: www.cancer.org °American Society of Clinical Oncology: www.cancer.net °National Cancer Institute: www.cancer.gov °Contact a doctor if: °You have new or more trouble peeing. °You have new or more blood in your pee. °You have new or more pain in your hips, back, or chest. °Get help right away if: °You have weakness in your legs. °You lose feeling in your legs. °You cannot control your pee or your poop (stool). °You have chills or a fever. °Summary °The prostate is a male gland that helps make semen. °Prostate cancer is when abnormal cells grow in this gland. °Treatment includes doing surgery, using medicines, using strong beams of energy, or watching without treatment. °Ask your doctor for help to find a support group for men with prostate cancer. °Contact a doctor if you have problems peeing or have any new pain that you did not have before. °This information is not intended to replace advice given to you by your health care provider. Make sure you discuss any questions you have with your health care provider. °Document Revised: 12/30/2020 Document Reviewed: 12/30/2020 °Elsevier   Patient Education © 2022 Elsevier Inc. ° °
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
## 2021-11-25 ENCOUNTER — Other Ambulatory Visit: Payer: Medicare HMO

## 2021-11-25 LAB — BASIC METABOLIC PANEL
BUN/Creatinine Ratio: 10 (ref 9–20)
BUN: 10 mg/dL (ref 6–24)
CO2: 21 mmol/L (ref 20–29)
Calcium: 9.5 mg/dL (ref 8.7–10.2)
Chloride: 100 mmol/L (ref 96–106)
Creatinine, Ser: 0.97 mg/dL (ref 0.76–1.27)
Glucose: 84 mg/dL (ref 70–99)
Potassium: 4.6 mmol/L (ref 3.5–5.2)
Sodium: 138 mmol/L (ref 134–144)
eGFR: 90 mL/min/{1.73_m2} (ref >59)

## 2021-12-01 ENCOUNTER — Ambulatory Visit: Payer: Medicare HMO | Admitting: Urology

## 2021-12-03 ENCOUNTER — Telehealth: Payer: Self-pay | Admitting: Radiation Oncology

## 2021-12-03 NOTE — Telephone Encounter (Signed)
Left voicemail 2/17 @ 8:20 am for patient to call our office.

## 2021-12-08 ENCOUNTER — Ambulatory Visit (HOSPITAL_COMMUNITY)
Admission: RE | Admit: 2021-12-08 | Discharge: 2021-12-08 | Disposition: A | Discharge status: Home / Self Care | Payer: Medicare HMO | Source: Ambulatory Visit | Attending: Urology | Admitting: Urology

## 2021-12-08 ENCOUNTER — Other Ambulatory Visit: Payer: Self-pay

## 2021-12-08 DIAGNOSIS — C61 Malignant neoplasm of prostate: Secondary | ICD-10-CM | POA: Diagnosis not present

## 2021-12-08 DIAGNOSIS — N200 Calculus of kidney: Secondary | ICD-10-CM | POA: Diagnosis not present

## 2021-12-08 MED ORDER — IOHEXOL 300 MG/ML  SOLN
100.0000 mL | Freq: Once | INTRAMUSCULAR | Status: AC | PRN
  Administered 2021-12-08: 100 mL via INTRAVENOUS

## 2021-12-09 ENCOUNTER — Encounter (HOSPITAL_COMMUNITY)
Admission: RE | Admit: 2021-12-09 | Discharge: 2021-12-09 | Disposition: A | Discharge status: Home / Self Care | Payer: Medicare HMO | Source: Ambulatory Visit | Attending: Urology | Admitting: Urology

## 2021-12-09 ENCOUNTER — Encounter (HOSPITAL_COMMUNITY): Payer: Self-pay

## 2021-12-09 DIAGNOSIS — C61 Malignant neoplasm of prostate: Secondary | ICD-10-CM | POA: Insufficient documentation

## 2021-12-09 HISTORY — DX: Malignant (primary) neoplasm, unspecified: C80.1

## 2021-12-09 MED ORDER — TECHNETIUM TC 99M MEDRONATE IV KIT
20.0000 | PACK | Freq: Once | INTRAVENOUS | Status: AC | PRN
  Administered 2021-12-09: 22 via INTRAVENOUS

## 2021-12-15 NOTE — Progress Notes (Signed)
GU Location of Tumor / Histology: Prostate Ca ? ?If Prostate Cancer, Gleason Score is (4 + 3) and PSA is (12.5) ? ?Biopsies: ?11/24/2021 ?Dr. Alyson Ingles ?Biopsy revealed Gleason 3+4=7 in 4/12 cores all on right and Gleason 4+3=7 in 2/12 cores at right apex. PSA 12.5. He has mild LUTS. He has mild Erectile dysfunction.  ? ?Past/Anticipated interventions by urology, if any:  ? ?Past/Anticipated interventions by medical oncology, if any: NA ? ?Weight changes, if any:  No ? ?IPSS:  2 ?SHIM:  3 ? ?Bowel/Bladder complaints, if any:  No ? ?Nausea/Vomiting, if any: No ? ?Pain issues, if any:  0/10 ? ?SAFETY ISSUES: ?Prior radiation?  No ?Pacemaker/ICD?  No ?Pregnancy?  Male ?Is the patient on methotrexate?  No ? ?Current Complaints / other details:  Need information on treatment options. ?

## 2021-12-19 DIAGNOSIS — C61 Malignant neoplasm of prostate: Secondary | ICD-10-CM | POA: Insufficient documentation
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
## 2021-12-20 ENCOUNTER — Ambulatory Visit
Admission: RE | Admit: 2021-12-20 | Discharge: 2021-12-20 | Disposition: A | Discharge status: Home / Self Care | Payer: Medicare HMO | Source: Ambulatory Visit | Attending: Radiation Oncology | Admitting: Radiation Oncology

## 2021-12-20 ENCOUNTER — Other Ambulatory Visit: Payer: Self-pay

## 2021-12-20 VITALS — BP 87/67 | HR 97 | Temp 97.8°F | Resp 20 | Ht 68.0 in | Wt 222.4 lb

## 2021-12-20 DIAGNOSIS — Z79899 Other long term (current) drug therapy: Secondary | ICD-10-CM | POA: Diagnosis not present

## 2021-12-20 DIAGNOSIS — J449 Chronic obstructive pulmonary disease, unspecified: Secondary | ICD-10-CM | POA: Diagnosis not present

## 2021-12-20 DIAGNOSIS — C61 Malignant neoplasm of prostate: Secondary | ICD-10-CM

## 2021-12-20 DIAGNOSIS — F1110 Opioid abuse, uncomplicated: Secondary | ICD-10-CM | POA: Insufficient documentation

## 2021-12-20 DIAGNOSIS — Z87891 Personal history of nicotine dependence: Secondary | ICD-10-CM | POA: Insufficient documentation

## 2021-12-20 DIAGNOSIS — J45909 Unspecified asthma, uncomplicated: Secondary | ICD-10-CM | POA: Insufficient documentation

## 2021-12-20 DIAGNOSIS — R972 Elevated prostate specific antigen [PSA]: Secondary | ICD-10-CM | POA: Diagnosis not present

## 2021-12-20 NOTE — Progress Notes (Signed)
Introduced myself to the patient as the prostate nurse navigator.  He is here to discuss his radiation treatment options, and if he decides on radiation would like to have treatment closer to home in [place].  I gave him my business card and asked him to call me with questions or concerns.  Verbalized understanding.  ?

## 2021-12-21 ENCOUNTER — Other Ambulatory Visit: Payer: Self-pay

## 2021-12-21 DIAGNOSIS — C61 Malignant neoplasm of prostate: Secondary | ICD-10-CM

## 2021-12-21 NOTE — Progress Notes (Signed)
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  Patient is interested in moving forward with Short Term ADT for 6 months and IMRT at [facility].   ? ?Apt on 3/17 with Dr. Alyson Ingles set up, will follow up to ensure ADT started.   Referral successfully faxed to [facility] for RadOnc consult.  Will follow up to ensure consult is scheduled appropriately.    ?

## 2021-12-24 NOTE — Progress Notes (Signed)
RN called to [facility] to check status of referral.  They received referral however have been unable to reach patient.  They are able to provide a potential date of 3/16 @ 10:30 if patient can contact them to confirm.  ? ?RN attempted call to patient, no answer.  RN contacted patient's Westley Foots.   Contact information provided and she will communicate with patient due to his phone being out of service.   Requested all contact information be updated to her primary number.   ?

## 2021-12-29 ENCOUNTER — Ambulatory Visit: Payer: Medicare HMO | Admitting: Urology

## 2021-12-31 ENCOUNTER — Ambulatory Visit: Payer: Medicare HMO | Admitting: Urology

## 2021-12-31 ENCOUNTER — Other Ambulatory Visit: Payer: Self-pay

## 2021-12-31 ENCOUNTER — Ambulatory Visit (INDEPENDENT_AMBULATORY_CARE_PROVIDER_SITE_OTHER): Payer: Medicare HMO | Admitting: Urology

## 2021-12-31 VITALS — BP 118/82 | HR 66

## 2021-12-31 DIAGNOSIS — R911 Solitary pulmonary nodule: Secondary | ICD-10-CM | POA: Diagnosis not present

## 2021-12-31 DIAGNOSIS — C61 Malignant neoplasm of prostate: Secondary | ICD-10-CM | POA: Diagnosis not present

## 2021-12-31 MED ORDER — ORGOVYX 120 MG PO TABS: 1.0000 | ORAL_TABLET | Freq: Every day | ORAL | 4 refills | Status: DC

## 2021-12-31 MED ORDER — TAMSULOSIN HCL 0.4 MG PO CAPS: 0.4000 mg | ORAL_CAPSULE | Freq: Every day | ORAL | 11 refills | Status: DC
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
## 2022-01-03 NOTE — Progress Notes (Signed)
Patient had consult with Dr. Lynnette Caffey on 3/17, and consult with Dr. Alyson Ingles on 3/17.  ? ?Orgovyx started 3/17.  ? ?Pending appointments for fiducial's, spaceOAR and CT Simulation.  Dr. Lynnette Caffey office staff will coordinate with Dr. Noland Fordyce staff to get scheduled.   ?

## 2022-01-04 ENCOUNTER — Other Ambulatory Visit: Payer: Self-pay | Admitting: Urology

## 2022-01-04 ENCOUNTER — Encounter: Payer: Self-pay | Admitting: Urology

## 2022-01-04 ENCOUNTER — Telehealth: Payer: Self-pay | Admitting: Urology

## 2022-01-04 NOTE — Patient Instructions (Signed)
Prostate Cancer °The prostate is a small gland that helps make semen. It is located below a man's bladder, in front of the rectum. Prostate cancer is when abnormal cells grow in this gland. °What are the causes? °The cause of this condition is not known. °What increases the risk? °Being age 60 or older. °Having a family history of prostate cancer. °Having a family history of cancer of the breasts or ovaries. °Having genes that are passed from parent to child (inherited). °Having Lynch syndrome. °African American men and men of African descent are diagnosed with prostate cancer at higher rates than other men. °What are the signs or symptoms? °Problems peeing (urinating). This may include: °A stream that is weak, or pee that stops and starts. °Trouble starting or stopping your pee. °Trouble emptying all of your pee. °Needing to pee more often, especially at night. °Blood in your pee or semen. °Pain in the: °Lower back. °Lower belly (abdomen). °Hips. °Trouble getting an erection. °Weakness or numbness in the legs or feet. °How is this treated? °Treatment for this condition depends on: °How much the cancer has spread. °Your age. °The kind of treatment you want. °Your health. °Treatments include: °Being watched. This is called observation. You will be tested from time to time, but you will not get treated. Tests are to make sure that the cancer is not growing. °Surgery. This may be done to: °Take out (remove) the prostate. °Freeze and kill cancer cells. °Radiation. This uses a strong beam of energy to kill cancer cells. °Chemotherapy. This uses medicines that stop cancer cells from increasing. This kills cancer cells and healthy cells. °Targeted therapy. This kills cancer cells only. Healthy cells are not affected. °Hormone treatment. This stops the body from making hormones that help the cancer cells grow. °Follow these instructions at home: °Lifestyle °Do not smoke or use any products that contain nicotine or tobacco.  If you need help quitting, ask your doctor. °Eat a healthy diet. °Treatment may affect your ability to have sex. If you have a partner, touch, hold, hug, and caress your partner to have intimate moments. °Get plenty of sleep. °Ask your doctor for help to find a support group for men with prostate cancer. °General instructions °Take over-the-counter and prescription medicines only as told by your doctor. °If you have to go to the hospital, let your cancer doctor (oncologist) know. °Keep all follow-up visits. °Where to find more information °American Cancer Society: www.cancer.org °American Society of Clinical Oncology: www.cancer.net °National Cancer Institute: www.cancer.gov °Contact a doctor if: °You have new or more trouble peeing. °You have new or more blood in your pee. °You have new or more pain in your hips, back, or chest. °Get help right away if: °You have weakness in your legs. °You lose feeling in your legs. °You cannot control your pee or your poop (stool). °You have chills or a fever. °Summary °The prostate is a male gland that helps make semen. °Prostate cancer is when abnormal cells grow in this gland. °Treatment includes doing surgery, using medicines, using strong beams of energy, or watching without treatment. °Ask your doctor for help to find a support group for men with prostate cancer. °Contact a doctor if you have problems peeing or have any new pain that you did not have before. °This information is not intended to replace advice given to you by your health care provider. Make sure you discuss any questions you have with your health care provider. °Document Revised: 12/30/2020 Document Reviewed: 12/30/2020 °Elsevier   Patient Education © 2022 Elsevier Inc. ° °

## 2022-01-04 NOTE — Telephone Encounter (Signed)
FYI, patient cancelled his appt with Dr. Alexis Frock at [facility] on 01/03/22 @ 1:00 pm.  Referral reason: Prostate cancer [facility]) ? ? ?"Rejection Reason - Patient Declined" ?Rosalio Loud said on Jan 04, 2022 8:59 AM ?

## 2022-01-04 NOTE — Progress Notes (Signed)
Surgical Physician Order [facility] [place] ? ?* Scheduling expectation :  mid april ? ?*Length of Case: 30 minutes ? ?*MD Preforming Case: Nicolette Bang, MD ? ?*Assistant Needed: no ? ?*Facility Preference: [facility] ? ?*Clearance needed: no ? ?*Anticoagulation Instructions: Hold all anticoagulants ? ?*Aspirin Instructions: Ok to continue Aspirin ? ?-Admit type: OUTpatient ? ?-Anesthesia: General ? ?-Use Standing Orders:  NA ? ?*Diagnosis: Prostate Cancer ? ?*Procedure:  Placement of Fiducial Markers and SpaceOAR   ? ?Additional orders: N/A ? ?-Equipment:  BK [place] ?-VTE Prophylaxis Standing Order SCD?s    ?   ?Other:  ? ?-Standing Lab Orders Per Anesthesia   ? ?Lab other: None ? ?-Standing Test orders EKG/Chest x-ray per Anesthesia      ? ?Test other:  ? ?- Medications:  Ancef 2gm IV ? ?-Other orders:  Fleets enema AM ? ?*Post-op visit Date/Instructions:   2 weeks   ?

## 2022-01-05 ENCOUNTER — Other Ambulatory Visit: Payer: Self-pay

## 2022-01-05 DIAGNOSIS — C61 Malignant neoplasm of prostate: Secondary | ICD-10-CM

## 2022-01-05 NOTE — Progress Notes (Signed)
Surgery scheduled 02/10/2022 at [facility].  ?

## 2022-01-13 DIAGNOSIS — J449 Chronic obstructive pulmonary disease, unspecified: Secondary | ICD-10-CM | POA: Diagnosis not present

## 2022-01-13 DIAGNOSIS — M5442 Lumbago with sciatica, left side: Secondary | ICD-10-CM | POA: Diagnosis not present

## 2022-01-13 DIAGNOSIS — Z6833 Body mass index (BMI) 33.0-33.9, adult: Secondary | ICD-10-CM | POA: Diagnosis not present

## 2022-01-17 DIAGNOSIS — R911 Solitary pulmonary nodule: Secondary | ICD-10-CM | POA: Diagnosis not present

## 2022-01-17 DIAGNOSIS — R918 Other nonspecific abnormal finding of lung field: Secondary | ICD-10-CM | POA: Diagnosis not present

## 2022-01-17 DIAGNOSIS — C61 Malignant neoplasm of prostate: Secondary | ICD-10-CM | POA: Diagnosis not present

## 2022-02-07 NOTE — Patient Instructions (Signed)
? ? Jerome Daniel ? 02/07/2022  ?  ? '@PREFPERIOPPHARMACY'$ @ ? ? Your procedure is scheduled on 02/10/2022. ? Report to [facility] at 6:30 A.M. ? Call this number if you have problems the morning of surgery: ? [phone] ? ? Remember: ? Do not eat or drink after midnight. ?  ?  ? Take these medicines the morning of surgery with A SIP OF WATER : none ?  ? Do not wear jewelry, make-up or nail polish. ? Do not wear lotions, powders, or perfumes, or deodorant. ? Do not shave 48 hours prior to surgery.  Men may shave face and neck. ? Do not bring valuables to the hospital. ? [facility] is not responsible for any belongings or valuables. ? ?Contacts, dentures or bridgework may not be worn into surgery.  Leave your suitcase in the car.  After surgery it may be brought to your room. ? ?For patients admitted to the hospital, discharge time will be determined by your treatment team. ? ?Patients discharged the day of surgery will not be allowed to drive home.  ? ?Name and phone number of your driver:   Family ?Special instructions:  N/a ? ?Please read over the following fact sheets that you were given. ?Care and Recovery After Surgery ? ?Transrectal Ultrasound-Guided Prostate Gold Seed Placement ?Transrectal ultrasound-guided prostate gold seed placement is a procedure used to place small metal seeds (fiducial markers)in or around a tumor in the prostate. These seeds help show exactly where a tumor is located. This helps aim radiation therapy right at the prostate tumor, which helps limit damage to nearby healthy tissue. ?During this procedure, a small device (probe) is lubricated and put inside the rectum. The probe sends out sound waves that make a picture of the prostate (transrectal ultrasound). The images are used to help guide the placement of the gold seeds. This procedure is also called fiducial marker placement. ?Tell a health care provider about: ?Any allergies you have. ?All medicines you are taking, including  vitamins, herbs, eye drops, creams, and over-the-counter medicines. ?Any problems you or family members have had with anesthetic medicines. ?Any bleeding problems you have. ?Any surgeries you have had. ?Any medical conditions you have. ?Any prostate infections you have had. ?What are the risks? ?Generally, this is a safe procedure. However, problems may occur, including: ?Infection. ?Bleeding. ?Allergic reactions to medicines or dyes. ?Damage to nearby structures or organs. ?The gold seeds moving to another part of the body. This is rare. ?What happens before the procedure? ?Staying hydrated ?Follow instructions from your health care provider about hydration, which may include: ?Up to 2 hours before the procedure - you may continue to drink clear liquids, such as water, clear fruit juice, black coffee, and plain tea. ?Eating and drinking restrictions ?Follow instructions from your health care provider about eating and drinking, which may include: ?8 hours before the procedure - stop eating heavy meals or foods, such as meat, fried foods, or fatty foods. ?6 hours before the procedure - stop eating light meals or foods, such as toast or cereal. ?6 hours before the procedure - stop drinking milk or drinks that contain milk. ?2 hours before the procedure - stop drinking clear liquids. ?Medicines ?Ask your health care provider about: ?Changing or stopping your regular medicines. This is especially important if you are taking diabetes medicines or blood thinners. ?Taking medicines such as aspirin and ibuprofen. These medicines can thin your blood. Do not take these medicines unless your health care provider  tells you to take them. ?Taking over-the-counter medicines, vitamins, herbs, and supplements. ?Follow your health care provider's instructions about cleaning out your bowels. ?Surgery safety ?Ask your health care provider: ?How the seeds will be put into your prostate. The seeds may be put in: ?Through your rectum  (transrectal placement). ?Through your perineum, which is the skin behind your scrotum (transperineal approach). ?What steps will be taken to help prevent infection. These steps may include: ?Removing hair at the procedure site. ?Washing skin with a germ-killing soap. ?Taking antibiotics before and after the procedure. ?General instructions ?You may have an exam or testing. You may have blood samples taken. ?Plan to have a responsible adult take you home from the hospital or clinic. ?Plan to have a responsible adult care for you for the time you are told after you leave the hospital or clinic. ?What happens during the procedure? ? ?An IV will be put into one of the veins in your hand or arm. ?You may be given: ?A medicine to help you relax (sedative). ?A medicine to numb the area (local anesthetic). ?A medicine that is injected into an area of your body to numb everything below the injection site (regional anesthetic). ?You will be placed on your left side with your knees bent up toward your chest. ?A lubricated probe will be put into your rectum to do the ultrasound. ?Using the ultrasound as a guide, the health care provider will insert a needle into your prostate through either the perineum or the rectum and then will place the tip of the needle near the tumor. ?The needle will be used to inject the gold seed into the area around the tumor. This will be repeated with more gold seeds. The number of seeds used depends on the size and location of the tumor. ?The needle and probe will be removed. ?The procedure may vary among health care providers and hospitals. ?What happens after the procedure? ?Your blood pressure, heart rate, breathing rate, and blood oxygen level will be monitored until you leave the hospital or clinic. ?You may have imaging tests done to check the placement of the gold seeds. This may include a CT scan or ultrasound. ?If you were given a sedative during the procedure, it can affect you for  several hours. Do not drive or operate machinery until your health care provider says that it is safe. ?You may be given medicine to help with pain, if needed. ?Summary ?Transrectal ultrasound-guided prostate gold seed placement is a procedure to place small metal seeds (fiducial markers)in or near a tumor in the prostate. ?The gold seeds are used to aim radiation therapy directly at the prostate tumor, which helps limit damage to nearby healthy tissue. ?During this procedure, a small probe will be placed in the rectum to take images of the prostate (transrectal ultrasound). The images will help guide the placement of the gold seeds. ?This information is not intended to replace advice given to you by your health care provider. Make sure you discuss any questions you have with your health care provider. ?Document Revised: 12/30/2020 Document Reviewed: 12/30/2020 ?Elsevier Patient Education ? [place]. ? ?General Anesthesia, Adult ?General anesthesia is the use of medicines to make a person "go to sleep" (unconscious) for a medical procedure. General anesthesia must be used for certain procedures, and is often recommended for procedures that: ?Last a long time. ?Require you to be still or in an unusual position. ?Are major and can cause blood loss. ?The medicines used for  general anesthesia are called general anesthetics. As well as making you unconscious for a certain amount of time, these medicines: ?Prevent pain. ?Control your blood pressure. ?Relax your muscles. ?Tell a health care provider about: ?Any allergies you have. ?All medicines you are taking, including vitamins, herbs, eye drops, creams, and over-the-counter medicines. ?Any problems you or family members have had with anesthetic medicines. ?Types of anesthetics you have had in the past. ?Any blood disorders you have. ?Any surgeries you have had. ?Any medical conditions you have. ?Any recent upper respiratory, chest, or ear infections. ?Any  history of: ?Heart or lung conditions, such as heart failure, sleep apnea, asthma, or chronic obstructive pulmonary disease (COPD). ?Armed forces logistics/support/administrative officer. ?Depression or anxiety. ?Any tobacco or drug use, including marijuana

## 2022-02-08 ENCOUNTER — Encounter (HOSPITAL_COMMUNITY)
Admission: RE | Admit: 2022-02-08 | Discharge: 2022-02-08 | Disposition: A | Discharge status: Home / Self Care | Payer: Medicare HMO | Source: Ambulatory Visit | Attending: Urology | Admitting: Urology

## 2022-02-08 DIAGNOSIS — Z01818 Encounter for other preprocedural examination: Secondary | ICD-10-CM

## 2022-02-10 ENCOUNTER — Ambulatory Visit (HOSPITAL_COMMUNITY)
Admission: RE | Admit: 2022-02-10 | Discharge: 2022-02-10 | Disposition: A | Discharge status: Home / Self Care | Payer: Medicare HMO | Attending: Urology | Admitting: Urology

## 2022-02-10 ENCOUNTER — Other Ambulatory Visit: Payer: Self-pay

## 2022-02-10 ENCOUNTER — Encounter (HOSPITAL_COMMUNITY)
Admission: RE | Disposition: A | Discharge status: Home / Self Care | Payer: Self-pay | Source: Home / Self Care | Attending: Urology

## 2022-02-10 ENCOUNTER — Ambulatory Visit (HOSPITAL_BASED_OUTPATIENT_CLINIC_OR_DEPARTMENT_OTHER): Payer: Medicare HMO | Admitting: Anesthesiology

## 2022-02-10 ENCOUNTER — Encounter (HOSPITAL_COMMUNITY): Payer: Self-pay | Admitting: Urology

## 2022-02-10 ENCOUNTER — Ambulatory Visit (HOSPITAL_COMMUNITY): Payer: Medicare HMO | Admitting: Anesthesiology

## 2022-02-10 ENCOUNTER — Ambulatory Visit (HOSPITAL_COMMUNITY): Payer: Medicare HMO

## 2022-02-10 DIAGNOSIS — J189 Pneumonia, unspecified organism: Secondary | ICD-10-CM

## 2022-02-10 DIAGNOSIS — J449 Chronic obstructive pulmonary disease, unspecified: Secondary | ICD-10-CM | POA: Diagnosis not present

## 2022-02-10 DIAGNOSIS — Z87891 Personal history of nicotine dependence: Secondary | ICD-10-CM | POA: Insufficient documentation

## 2022-02-10 DIAGNOSIS — C61 Malignant neoplasm of prostate: Secondary | ICD-10-CM | POA: Diagnosis not present

## 2022-02-10 DIAGNOSIS — F1911 Other psychoactive substance abuse, in remission: Secondary | ICD-10-CM | POA: Insufficient documentation

## 2022-02-10 DIAGNOSIS — Z01818 Encounter for other preprocedural examination: Secondary | ICD-10-CM

## 2022-02-10 HISTORY — PX: SPACE OAR INSTILLATION: SHX6769

## 2022-02-10 HISTORY — PX: GOLD SEED IMPLANT: SHX6343

## 2022-02-10 LAB — RAPID URINE DRUG SCREEN, HOSP PERFORMED
Amphetamines: NOT DETECTED
Barbiturates: NOT DETECTED
Benzodiazepines: NOT DETECTED
Cocaine: NOT DETECTED
Opiates: NOT DETECTED
Tetrahydrocannabinol: NOT DETECTED

## 2022-02-10 SURGERY — INSERTION, GOLD SEEDS
Anesthesia: General | Site: Prostate

## 2022-02-10 MED ORDER — ORAL CARE MOUTH RINSE: 15.0000 mL | Freq: Once | OROMUCOSAL | Status: AC

## 2022-02-10 MED ORDER — DEXAMETHASONE SODIUM PHOSPHATE 4 MG/ML IJ SOLN
INTRAMUSCULAR | Status: AC
  Filled 2022-02-10: qty 4

## 2022-02-10 MED ORDER — MIDAZOLAM HCL 2 MG/2ML IJ SOLN
INTRAMUSCULAR | Status: AC
  Filled 2022-02-10: qty 2

## 2022-02-10 MED ORDER — ONDANSETRON HCL 4 MG/2ML IJ SOLN
INTRAMUSCULAR | Status: DC | PRN
Start: 2022-02-10 — End: 2022-02-10
  Administered 2022-02-10: 4 mg via INTRAVENOUS

## 2022-02-10 MED ORDER — HYDROMORPHONE HCL 1 MG/ML IJ SOLN: 0.2500 mg | INTRAMUSCULAR | Status: DC | PRN

## 2022-02-10 MED ORDER — CEFAZOLIN SODIUM-DEXTROSE 2-4 GM/100ML-% IV SOLN
INTRAVENOUS | Status: AC
  Filled 2022-02-10: qty 100

## 2022-02-10 MED ORDER — CHLORHEXIDINE GLUCONATE 0.12 % MT SOLN
15.0000 mL | Freq: Once | OROMUCOSAL | Status: AC
  Administered 2022-02-10: 15 mL via OROMUCOSAL

## 2022-02-10 MED ORDER — LACTATED RINGERS IV SOLN: INTRAVENOUS | Status: DC

## 2022-02-10 MED ORDER — PHENYLEPHRINE 80 MCG/ML (10ML) SYRINGE FOR IV PUSH (FOR BLOOD PRESSURE SUPPORT)
PREFILLED_SYRINGE | INTRAVENOUS | Status: DC | PRN
  Administered 2022-02-10: 160 ug via INTRAVENOUS

## 2022-02-10 MED ORDER — DEXAMETHASONE SODIUM PHOSPHATE 10 MG/ML IJ SOLN
INTRAMUSCULAR | Status: DC | PRN
  Administered 2022-02-10: 8 mg via INTRAVENOUS

## 2022-02-10 MED ORDER — PROPOFOL 10 MG/ML IV BOLUS
INTRAVENOUS | Status: DC | PRN
  Administered 2022-02-10: 120 mg via INTRAVENOUS

## 2022-02-10 MED ORDER — SODIUM CHLORIDE (PF) 0.9 % IJ SOLN
INTRAMUSCULAR | Status: AC
  Filled 2022-02-10: qty 10

## 2022-02-10 MED ORDER — FENTANYL CITRATE (PF) 250 MCG/5ML IJ SOLN
INTRAMUSCULAR | Status: DC | PRN
  Administered 2022-02-10: 100 ug via INTRAVENOUS

## 2022-02-10 MED ORDER — MEPERIDINE HCL 50 MG/ML IJ SOLN: 6.2500 mg | INTRAMUSCULAR | Status: DC | PRN

## 2022-02-10 MED ORDER — FENTANYL CITRATE (PF) 100 MCG/2ML IJ SOLN
INTRAMUSCULAR | Status: AC
  Filled 2022-02-10: qty 2

## 2022-02-10 MED ORDER — MIDAZOLAM HCL 2 MG/2ML IJ SOLN
INTRAMUSCULAR | Status: DC | PRN
  Administered 2022-02-10: 2 mg via INTRAVENOUS

## 2022-02-10 MED ORDER — 0.9 % SODIUM CHLORIDE (POUR BTL) OPTIME
TOPICAL | Status: DC | PRN
  Administered 2022-02-10: 1000 mL

## 2022-02-10 MED ORDER — TRAMADOL HCL 50 MG PO TABS: 50.0000 mg | ORAL_TABLET | Freq: Four times a day (QID) | ORAL | 0 refills | Status: DC | PRN

## 2022-02-10 MED ORDER — PROPOFOL 10 MG/ML IV BOLUS
INTRAVENOUS | Status: AC
  Filled 2022-02-10: qty 20

## 2022-02-10 MED ORDER — ONDANSETRON HCL 4 MG/2ML IJ SOLN: 4.0000 mg | Freq: Once | INTRAMUSCULAR | Status: DC | PRN

## 2022-02-10 MED ORDER — CEFAZOLIN SODIUM-DEXTROSE 2-4 GM/100ML-% IV SOLN
2.0000 g | INTRAVENOUS | Status: AC
  Administered 2022-02-10: 2 g via INTRAVENOUS

## 2022-02-10 SURGICAL SUPPLY — 29 items
COVER BACK TABLE 60X90IN (DRAPES) ×3 IMPLANT
DRAPE EENT ADH APERT 31X51 STR (DRAPES) ×3 IMPLANT
DRAPE LEGGINS SURG 28X43 STRL (DRAPES) ×3 IMPLANT
DRSG TEGADERM 8X12 (GAUZE/BANDAGES/DRESSINGS) ×3 IMPLANT
GAUZE SPONGE 4X4 12PLY STRL (GAUZE/BANDAGES/DRESSINGS) ×6 IMPLANT
GLOVE BIO SURGEON STRL SZ8 (GLOVE) ×3 IMPLANT
GLOVE BIOGEL PI IND STRL 7.0 (GLOVE) ×4 IMPLANT
GLOVE BIOGEL PI IND STRL 8 (GLOVE) ×2 IMPLANT
GLOVE BIOGEL PI INDICATOR 7.0 (GLOVE) ×2
GLOVE BIOGEL PI INDICATOR 8 (GLOVE) ×1
GOWN STRL REUS W/TWL LRG LVL3 (GOWN DISPOSABLE) ×3 IMPLANT
GOWN STRL REUS W/TWL XL LVL3 (GOWN DISPOSABLE) ×3 IMPLANT
IMPL SPACEOAR SYSTEM 10ML (Spacer) ×2 IMPLANT
IMPLANT SPACEOAR SYSTEM 10ML (Spacer) ×2 IMPLANT
KIT TURNOVER CYSTO (KITS) ×3 IMPLANT
MARKER GOLD PRELOAD 1.2X3 (Urological Implant) ×2 IMPLANT
MARKER SKIN DUAL TIP RULER LAB (MISCELLANEOUS) ×3 IMPLANT
NDL HYPO 18GX1.5 BLUNT FILL (NEEDLE) ×2 IMPLANT
NEEDLE HYPO 18GX1.5 BLUNT FILL (NEEDLE) IMPLANT
NS IRRIG 1000ML POUR BTL (IV SOLUTION) ×3 IMPLANT
PAD ARMBOARD 7.5X6 YLW CONV (MISCELLANEOUS) ×6 IMPLANT
SEED GOLD PRELOAD 1.2X3 (Urological Implant) ×6 IMPLANT
SOL PREP POV-IOD 4OZ 10% (MISCELLANEOUS) ×3 IMPLANT
SURGILUBE 2OZ TUBE FLIPTOP (MISCELLANEOUS) ×3 IMPLANT
SYR 10ML LL (SYRINGE) ×2 IMPLANT
SYR CONTROL 10ML LL (SYRINGE) ×3 IMPLANT
TOWEL NATURAL 4PK STERILE (DISPOSABLE) ×3 IMPLANT
UNDERPAD 30X36 HEAVY ABSORB (UNDERPADS AND DIAPERS) ×3 IMPLANT
WATER STERILE IRR 500ML POUR (IV SOLUTION) ×2 IMPLANT

## 2022-02-10 NOTE — Anesthesia Preprocedure Evaluation (Signed)
Anesthesia Evaluation  ?Patient identified by MRN, date of birth, ID band ?Patient awake ? ? ? ?Reviewed: ?Allergy & Precautions, NPO status , Patient's Chart, lab work & pertinent test results ? ?Airway ?Mallampati: II ? ?TM Distance: >3 FB ?Neck ROM: Full ? ? ? Dental ? ?(+) Dental Advisory Given, Missing, Poor Dentition, Chipped ?  ?Pulmonary ?shortness of breath, asthma , pneumonia, COPD,  COPD inhaler, former smoker,  ?Bronchocentric granulomatosis ?  ?Pulmonary exam normal ?breath sounds clear to auscultation ? ? ? ? ? ? Cardiovascular ?negative cardio ROS ?Normal cardiovascular exam ?Rhythm:Regular Rate:Normal ? ? ?  ?Neuro/Psych ?negative neurological ROS ? negative psych ROS  ? GI/Hepatic ?negative GI ROS, (+)  ?  ? substance abuse ? alcohol use and cocaine use,   ?Endo/Other  ?negative endocrine ROS ? Renal/GU ?negative Renal ROS  ?negative genitourinary ?  ?Musculoskeletal ?negative musculoskeletal ROS ?(+) narcotic dependent ? Abdominal ?  ?Peds ?negative pediatric ROS ?(+)  Hematology ?negative hematology ROS ?(+)   ?Anesthesia Other Findings ?Prostate cancer ? Reproductive/Obstetrics ?negative OB ROS ? ?  ? ? ? ? ? ? ? ? ? ? ? ? ? ?  ?  ? ? ? ? ? ? ? ? ?Anesthesia Physical ?Anesthesia Plan ? ?ASA: 3 ? ?Anesthesia Plan: General  ? ?Post-op Pain Management: Dilaudid IV  ? ?Induction: Intravenous ? ?PONV Risk Score and Plan: 4 or greater and Ondansetron and Dexamethasone ? ?Airway Management Planned: LMA ? ?Additional Equipment:  ? ?Intra-op Plan:  ? ?Post-operative Plan: Extubation in OR ? ?Informed Consent: I have reviewed the patients History and Physical, chart, labs and discussed the procedure including the risks, benefits and alternatives for the proposed anesthesia with the patient or authorized representative who has indicated his/her understanding and acceptance.  ? ? ? ?Dental advisory given ? ?Plan Discussed with: CRNA and Surgeon ? ?Anesthesia Plan Comments:    ? ? ? ? ? ? ?Anesthesia Quick Evaluation ? ?

## 2022-02-10 NOTE — Anesthesia Postprocedure Evaluation (Signed)
Anesthesia Post Note ? ?Patient: Jerome Daniel ? ?Procedure(s) Performed: GOLD SEED IMPLANT (Prostate) ?SPACE OAR INSTILLATION (Prostate) ? ?Patient location during evaluation: Phase II ?Anesthesia Type: General ?Level of consciousness: awake and alert and oriented ?Pain management: pain level controlled ?Vital Signs Assessment: post-procedure vital signs reviewed and stable ?Respiratory status: spontaneous breathing, nonlabored ventilation and respiratory function stable ?Cardiovascular status: blood pressure returned to baseline and stable ?Postop Assessment: no apparent nausea or vomiting ?Anesthetic complications: no ? ? ?No notable events documented. ? ? ?Last Vitals:  ?Vitals:  ? 02/10/22 0845 02/10/22 0902  ?BP: (!) 125/91 126/87  ?Pulse: 65 (!) 52  ?Resp: 10 12  ?Temp:  36.6 ?C  ?SpO2: 99% 99%  ?  ?Last Pain:  ?Vitals:  ? 02/10/22 0902  ?TempSrc: Oral  ?PainSc: 0-No pain  ? ? ?  ?  ?  ?  ?  ?  ? ?[place] C Janayia Burggraf ? ? ? ? ?

## 2022-02-10 NOTE — Anesthesia Procedure Notes (Addendum)
Procedure Name: LMA Insertion ?Date/Time: 02/10/2022 7:50 AM ?Performed by: Minerva Ends, CRNA ?Pre-anesthesia Checklist: Patient identified, Emergency Drugs available, Suction available and Patient being monitored ?Patient Re-evaluated:Patient Re-evaluated prior to induction ?Oxygen Delivery Method: Circle system utilized ?Preoxygenation: Pre-oxygenation with 100% oxygen ?Induction Type: IV induction ?Ventilation: Mask ventilation without difficulty ?LMA: LMA inserted ?LMA Size: 4.0 ?Tube type: Oral ?Number of attempts: 1 ?Placement Confirmation: positive ETCO2 and breath sounds checked- equal and bilateral ?Tube secured with: Tape ?Dental Injury: Teeth and Oropharynx as per pre-operative assessment  ? ? ? ? ?

## 2022-02-10 NOTE — Transfer of Care (Signed)
Immediate Anesthesia Transfer of Care Note ? ?Patient: Jerome Daniel ? ?Procedure(s) Performed: GOLD SEED IMPLANT (Prostate) ?SPACE OAR INSTILLATION (Prostate) ? ?Patient Location: PACU ? ?Anesthesia Type:General ? ?Level of Consciousness: awake, alert  and oriented ? ?Airway & Oxygen Therapy: Patient Spontanous Breathing ? ?Post-op Assessment: Report given to RN and Post -op Vital signs reviewed and stable ? ?Post vital signs: Reviewed and stable ? ?Last Vitals:  ?Vitals Value Taken Time  ?BP 107/80 02/10/22 0820  ?Temp    ?Pulse 64 02/10/22 0823  ?Resp 12 02/10/22 0823  ?SpO2 96 % 02/10/22 0823  ?Vitals shown include unvalidated device data. ? ?Last Pain:  ?Vitals:  ? 02/10/22 0654  ?TempSrc: Oral  ?PainSc: 0-No pain  ?   ? ?  ? ?Complications: No notable events documented. ?
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
## 2022-02-10 NOTE — Op Note (Signed)
PRE-OPERATIVE DIAGNOSIS:  Adenocarcinoma of the prostate ? ?POST-OPERATIVE DIAGNOSIS:  Same ? ?PROCEDURE: 1. Prostate Ultrasound ?2. Placement of fiducial marks ?3. Placement of SpaceOAR ? ?SURGEON:  Surgeon(s): ?Nicolette Bang, MD ? ?ANESTHESIA:  General ? ?EBL:  Minimal ? ?DRAINS: none ? ?FINDINGS:  Multiple prostatic calcifications at right mid gland. 37.8cc prostate on [place] ? ?INDICATION: Jerome Daniel is a 61 year old with a history of T1c prostate cancer who is scheduled to undergo IMRT. He wishes to have fiducial markers and SpaceOAR placed prior to IMRT to decrease rectal toxicity. ? ?Description of procedure: After informed consent the patient was brought to the major OR, placed on the table and administered general anesthesia. He was then moved to the modified lithotomy position with his perineum perpendicular to the floor. His perineum and genitalia were then sterilely prepped. An official timeout was then performed. The transrectal ultrasound probe was placed in the rectum and affixed to the stand. He was then sterilely draped. ? ?A transrectal ultrasound of the prostate was performed.  Lidocaine  was not  instilled using ultrasound guidance into the junction of each seminal vesicle of the prostate. ? ?3 Gold markers were placed into the prostate using the standard template and ultrasound guidance.  Accurate placement of the markers was confirmed. ? ?We then proceeded to mix the SpaceOAR using the kit supplied from the manufacturer. Once this was complete we placed a sinal needle into the perirectal fat between the rectum and the prostate. Once this was accomplished we injected 2cc of normal saline to hydrodissect the plain. We then instilled the the SpaceOAR through the spinal needle and noted good distribution in the perirectal fat.  ? ?The patient was awakened and taken to recovery room in stable and satisfactory condition. He tolerated procedure well and there were no intraoperative  complications. ? ?CONDITION: Stable, extubated, transferred to PACU ? ?PLAN: The patient is to be discharged home and he will start IMRT in the next 2-3 weeks ? ?

## 2022-02-14 ENCOUNTER — Encounter (HOSPITAL_COMMUNITY): Payer: Self-pay | Admitting: Urology

## 2022-02-15 DIAGNOSIS — C61 Malignant neoplasm of prostate: Secondary | ICD-10-CM | POA: Diagnosis not present

## 2022-02-15 DIAGNOSIS — Z510 Encounter for antineoplastic radiation therapy: Secondary | ICD-10-CM | POA: Diagnosis not present

## 2022-02-23 ENCOUNTER — Ambulatory Visit (INDEPENDENT_AMBULATORY_CARE_PROVIDER_SITE_OTHER): Payer: Medicare HMO | Admitting: Urology

## 2022-02-23 ENCOUNTER — Encounter: Payer: Self-pay | Admitting: Urology

## 2022-02-23 VITALS — BP 113/56 | HR 83

## 2022-02-23 DIAGNOSIS — C61 Malignant neoplasm of prostate: Secondary | ICD-10-CM | POA: Diagnosis not present
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
## 2022-02-23 NOTE — Patient Instructions (Signed)

## 2022-02-28 DIAGNOSIS — C61 Malignant neoplasm of prostate: Secondary | ICD-10-CM | POA: Diagnosis not present

## 2022-02-28 DIAGNOSIS — Z510 Encounter for antineoplastic radiation therapy: Secondary | ICD-10-CM | POA: Diagnosis not present

## 2022-03-01 DIAGNOSIS — C61 Malignant neoplasm of prostate: Secondary | ICD-10-CM | POA: Diagnosis not present

## 2022-03-01 DIAGNOSIS — Z510 Encounter for antineoplastic radiation therapy: Secondary | ICD-10-CM | POA: Diagnosis not present

## 2022-03-02 DIAGNOSIS — C61 Malignant neoplasm of prostate: Secondary | ICD-10-CM | POA: Diagnosis not present

## 2022-03-02 DIAGNOSIS — Z510 Encounter for antineoplastic radiation therapy: Secondary | ICD-10-CM | POA: Diagnosis not present

## 2022-03-03 DIAGNOSIS — C61 Malignant neoplasm of prostate: Secondary | ICD-10-CM | POA: Diagnosis not present

## 2022-03-03 DIAGNOSIS — Z510 Encounter for antineoplastic radiation therapy: Secondary | ICD-10-CM | POA: Diagnosis not present

## 2022-03-04 DIAGNOSIS — Z510 Encounter for antineoplastic radiation therapy: Secondary | ICD-10-CM | POA: Diagnosis not present

## 2022-03-04 DIAGNOSIS — C61 Malignant neoplasm of prostate: Secondary | ICD-10-CM | POA: Diagnosis not present

## 2022-03-07 DIAGNOSIS — Z510 Encounter for antineoplastic radiation therapy: Secondary | ICD-10-CM | POA: Diagnosis not present

## 2022-03-07 DIAGNOSIS — C61 Malignant neoplasm of prostate: Secondary | ICD-10-CM | POA: Diagnosis not present

## 2022-03-08 DIAGNOSIS — Z510 Encounter for antineoplastic radiation therapy: Secondary | ICD-10-CM | POA: Diagnosis not present

## 2022-03-08 DIAGNOSIS — C61 Malignant neoplasm of prostate: Secondary | ICD-10-CM | POA: Diagnosis not present

## 2022-03-09 DIAGNOSIS — Z510 Encounter for antineoplastic radiation therapy: Secondary | ICD-10-CM | POA: Diagnosis not present

## 2022-03-09 DIAGNOSIS — C61 Malignant neoplasm of prostate: Secondary | ICD-10-CM | POA: Diagnosis not present

## 2022-03-10 DIAGNOSIS — Z510 Encounter for antineoplastic radiation therapy: Secondary | ICD-10-CM | POA: Diagnosis not present

## 2022-03-10 DIAGNOSIS — C61 Malignant neoplasm of prostate: Secondary | ICD-10-CM | POA: Diagnosis not present

## 2022-03-11 DIAGNOSIS — C61 Malignant neoplasm of prostate: Secondary | ICD-10-CM | POA: Diagnosis not present

## 2022-03-11 DIAGNOSIS — Z510 Encounter for antineoplastic radiation therapy: Secondary | ICD-10-CM | POA: Diagnosis not present

## 2022-03-15 DIAGNOSIS — C61 Malignant neoplasm of prostate: Secondary | ICD-10-CM | POA: Diagnosis not present

## 2022-03-15 DIAGNOSIS — Z510 Encounter for antineoplastic radiation therapy: Secondary | ICD-10-CM | POA: Diagnosis not present

## 2022-03-16 DIAGNOSIS — Z510 Encounter for antineoplastic radiation therapy: Secondary | ICD-10-CM | POA: Diagnosis not present

## 2022-03-16 DIAGNOSIS — C61 Malignant neoplasm of prostate: Secondary | ICD-10-CM | POA: Diagnosis not present

## 2022-03-17 DIAGNOSIS — C61 Malignant neoplasm of prostate: Secondary | ICD-10-CM | POA: Diagnosis not present

## 2022-03-17 DIAGNOSIS — Z510 Encounter for antineoplastic radiation therapy: Secondary | ICD-10-CM | POA: Diagnosis not present

## 2022-03-18 DIAGNOSIS — Z510 Encounter for antineoplastic radiation therapy: Secondary | ICD-10-CM | POA: Diagnosis not present

## 2022-03-18 DIAGNOSIS — C61 Malignant neoplasm of prostate: Secondary | ICD-10-CM | POA: Diagnosis not present

## 2022-03-21 DIAGNOSIS — Z510 Encounter for antineoplastic radiation therapy: Secondary | ICD-10-CM | POA: Diagnosis not present

## 2022-03-21 DIAGNOSIS — C61 Malignant neoplasm of prostate: Secondary | ICD-10-CM | POA: Diagnosis not present

## 2022-03-22 DIAGNOSIS — C61 Malignant neoplasm of prostate: Secondary | ICD-10-CM | POA: Diagnosis not present

## 2022-03-22 DIAGNOSIS — Z510 Encounter for antineoplastic radiation therapy: Secondary | ICD-10-CM | POA: Diagnosis not present

## 2022-03-23 DIAGNOSIS — C61 Malignant neoplasm of prostate: Secondary | ICD-10-CM | POA: Diagnosis not present

## 2022-03-23 DIAGNOSIS — Z510 Encounter for antineoplastic radiation therapy: Secondary | ICD-10-CM | POA: Diagnosis not present

## 2022-03-24 DIAGNOSIS — C61 Malignant neoplasm of prostate: Secondary | ICD-10-CM | POA: Diagnosis not present

## 2022-03-24 DIAGNOSIS — Z510 Encounter for antineoplastic radiation therapy: Secondary | ICD-10-CM | POA: Diagnosis not present

## 2022-03-25 DIAGNOSIS — Z510 Encounter for antineoplastic radiation therapy: Secondary | ICD-10-CM | POA: Diagnosis not present

## 2022-03-25 DIAGNOSIS — C61 Malignant neoplasm of prostate: Secondary | ICD-10-CM | POA: Diagnosis not present

## 2022-03-28 DIAGNOSIS — Z510 Encounter for antineoplastic radiation therapy: Secondary | ICD-10-CM | POA: Diagnosis not present

## 2022-03-28 DIAGNOSIS — C61 Malignant neoplasm of prostate: Secondary | ICD-10-CM | POA: Diagnosis not present

## 2022-03-29 DIAGNOSIS — Z510 Encounter for antineoplastic radiation therapy: Secondary | ICD-10-CM | POA: Diagnosis not present

## 2022-03-29 DIAGNOSIS — C61 Malignant neoplasm of prostate: Secondary | ICD-10-CM | POA: Diagnosis not present

## 2022-03-30 DIAGNOSIS — Z510 Encounter for antineoplastic radiation therapy: Secondary | ICD-10-CM | POA: Diagnosis not present

## 2022-03-30 DIAGNOSIS — C61 Malignant neoplasm of prostate: Secondary | ICD-10-CM | POA: Diagnosis not present

## 2022-03-31 DIAGNOSIS — C61 Malignant neoplasm of prostate: Secondary | ICD-10-CM | POA: Diagnosis not present

## 2022-03-31 DIAGNOSIS — Z510 Encounter for antineoplastic radiation therapy: Secondary | ICD-10-CM | POA: Diagnosis not present

## 2022-04-01 DIAGNOSIS — C61 Malignant neoplasm of prostate: Secondary | ICD-10-CM | POA: Diagnosis not present

## 2022-04-01 DIAGNOSIS — Z510 Encounter for antineoplastic radiation therapy: Secondary | ICD-10-CM | POA: Diagnosis not present

## 2022-04-04 DIAGNOSIS — C61 Malignant neoplasm of prostate: Secondary | ICD-10-CM | POA: Diagnosis not present

## 2022-04-04 DIAGNOSIS — Z510 Encounter for antineoplastic radiation therapy: Secondary | ICD-10-CM | POA: Diagnosis not present

## 2022-04-05 DIAGNOSIS — Z510 Encounter for antineoplastic radiation therapy: Secondary | ICD-10-CM | POA: Diagnosis not present

## 2022-04-05 DIAGNOSIS — C61 Malignant neoplasm of prostate: Secondary | ICD-10-CM | POA: Diagnosis not present

## 2022-04-06 DIAGNOSIS — C61 Malignant neoplasm of prostate: Secondary | ICD-10-CM | POA: Diagnosis not present

## 2022-04-06 DIAGNOSIS — Z510 Encounter for antineoplastic radiation therapy: Secondary | ICD-10-CM | POA: Diagnosis not present

## 2022-04-07 DIAGNOSIS — Z510 Encounter for antineoplastic radiation therapy: Secondary | ICD-10-CM | POA: Diagnosis not present

## 2022-04-07 DIAGNOSIS — C61 Malignant neoplasm of prostate: Secondary | ICD-10-CM | POA: Diagnosis not present

## 2022-04-08 DIAGNOSIS — Z510 Encounter for antineoplastic radiation therapy: Secondary | ICD-10-CM | POA: Diagnosis not present

## 2022-04-08 DIAGNOSIS — C61 Malignant neoplasm of prostate: Secondary | ICD-10-CM | POA: Diagnosis not present

## 2022-05-05 DIAGNOSIS — M5442 Lumbago with sciatica, left side: Secondary | ICD-10-CM | POA: Diagnosis not present

## 2022-05-05 DIAGNOSIS — Z131 Encounter for screening for diabetes mellitus: Secondary | ICD-10-CM | POA: Diagnosis not present

## 2022-05-05 DIAGNOSIS — Z0000 Encounter for general adult medical examination without abnormal findings: Secondary | ICD-10-CM | POA: Diagnosis not present

## 2022-05-05 DIAGNOSIS — E669 Obesity, unspecified: Secondary | ICD-10-CM | POA: Diagnosis not present

## 2022-05-05 DIAGNOSIS — J449 Chronic obstructive pulmonary disease, unspecified: Secondary | ICD-10-CM | POA: Diagnosis not present

## 2022-05-05 DIAGNOSIS — Z6833 Body mass index (BMI) 33.0-33.9, adult: Secondary | ICD-10-CM | POA: Diagnosis not present

## 2022-05-10 ENCOUNTER — Encounter: Payer: Self-pay | Admitting: *Deleted

## 2022-05-16 DIAGNOSIS — R918 Other nonspecific abnormal finding of lung field: Secondary | ICD-10-CM | POA: Diagnosis not present

## 2022-05-16 DIAGNOSIS — R911 Solitary pulmonary nodule: Secondary | ICD-10-CM | POA: Diagnosis not present

## 2022-05-19 DIAGNOSIS — C61 Malignant neoplasm of prostate: Secondary | ICD-10-CM | POA: Diagnosis not present

## 2022-05-19 DIAGNOSIS — Z79818 Long term (current) use of other agents affecting estrogen receptors and estrogen levels: Secondary | ICD-10-CM | POA: Diagnosis not present

## 2022-05-19 DIAGNOSIS — Z510 Encounter for antineoplastic radiation therapy: Secondary | ICD-10-CM | POA: Diagnosis not present

## 2022-05-23 ENCOUNTER — Encounter: Payer: Self-pay | Admitting: *Deleted

## 2022-05-23 NOTE — Patient Instructions (Signed)
  Procedure: colonoscopy  Estimated body mass index is 33.91 kg/m as calculated from the following:   Height as of this encounter: '5\' 8"'$  (1.727 m).   Weight as of this encounter: 223 lb (101.2 kg).   Have you had a colonoscopy before?  No  Do you have family history of colon cancer  No  Do you have a family history of polyps? Unknown  Previous colonoscopy with polyps removed? No  Do you have a history colorectal cancer?   No  Are you diabetic?  No  Do you have a prosthetic or mechanical heart valve? No  Do you have a pacemaker/defibrillator?   No  Have you had endocarditis/atrial fibrillation?  No  Do you use supplemental oxygen/CPAP?  No  Have you had joint replacement within the last 12 months?  No  Do you tend to be constipated or have to use laxatives?  No   Do you have history of alcohol use? If yes, how much and how often.  Yes, occasional   Do you have history or are you using drugs? If yes, what do are you  using?  No  Have you ever had a stroke/heart attack?  No  Have you ever had a heart or other vascular stent placed,?No  Do you take weight loss medication? No  Do you take any blood-thinning medications such as: (Plavix, aspirin, Coumadin, Aggrenox, Brilinta, Xarelto, Eliquis, Pradaxa, Savaysa or Effient) No  If yes we need the name, milligram, dosage and who is prescribing doctor:  N/A             No current outpatient medications on file.   No current facility-administered medications for this visit.    No Known Allergies

## 2022-06-02 NOTE — Progress Notes (Signed)
Manuela Schwartz please schedule per Cyril Mourning thanks

## 2022-06-02 NOTE — Progress Notes (Signed)
LMOM that he will have a VV with [facility] and to call back to confirm

## 2022-06-02 NOTE — Progress Notes (Signed)
Patient has history of COPD. Additionally, doesn't look like his medication on this triage form is not accurate. Recommend OV prior to scheduling. OK for VV on a Friday with me.

## 2022-06-16 NOTE — Progress Notes (Signed)
   Primary Care Physician:  Neale Burly, MD  Primary GI: Dr. Abbey Chatters  Patient Location: Home   Provider Location: [facility] office   Reason for Visit: Colon cancer screening   Persons present on the virtual encounter, with roles: Aliene Altes, PA-C (Provider), Jerome Daniel (patient)   Total time (minutes) spent on medical discussion: 0 minutes  Virtual Visit Note  Chief Complaint  Patient presents with   Colonoscopy    History of Present Illness: Jerome Daniel is a 61 year old male with history of prostate cancer, COPD, polysubstance abuse, presenting today to discuss scheduling first-ever screening colonoscopy.  He was previously scheduled for colonoscopy in 2019, but no-show to his appointment.  Recently, he completed a colonoscopy triage form, but recommended office visit to schedule due to history of COPD.  Today: Unfortunately, patient was not able to connect to his virtual visit today.  He has been rescheduled to September 7 at 10:30 AM for an in person visit.   Aliene Altes, PA-C [facility]  06/17/2022

## 2022-06-17 ENCOUNTER — Telehealth: Payer: Self-pay | Admitting: Gastroenterology

## 2022-06-17 ENCOUNTER — Telehealth: Payer: Self-pay | Admitting: *Deleted

## 2022-06-17 ENCOUNTER — Encounter: Payer: Self-pay | Admitting: Gastroenterology

## 2022-06-17 ENCOUNTER — Telehealth (INDEPENDENT_AMBULATORY_CARE_PROVIDER_SITE_OTHER): Payer: Self-pay | Admitting: Gastroenterology

## 2022-06-17 VITALS — Ht 68.0 in | Wt 225.0 lb

## 2022-06-17 DIAGNOSIS — Z1211 Encounter for screening for malignant neoplasm of colon: Secondary | ICD-10-CM

## 2022-06-17 NOTE — Telephone Encounter (Signed)
Jerome Daniel, you are scheduled for a virtual visit with your provider today.  Just as we do with appointments in the office, we must obtain your consent to participate.  Your consent will be active for this visit and any virtual visit you may have with one of our providers in the next 365 days.  If you have a MyChart account, I can also send a copy of this consent to you electronically.  All virtual visits are billed to your insurance company just like a traditional visit in the office.  As this is a virtual visit, video technology does not allow for your provider to perform a traditional examination.  This may limit your provider's ability to fully assess your condition.  If your provider identifies any concerns that need to be evaluated in person or the need to arrange testing such as labs, EKG, etc, we will make arrangements to do so.  Although advances in technology are sophisticated, we cannot ensure that it will always work on either your end or our end.  If the connection with a video visit is poor, we may have to switch to a telephone visit.  With either a video or telephone visit, we are not always able to ensure that we have a secure connection.   I need to obtain your verbal consent now.   Are you willing to proceed with your visit today? Patient consent to virtual visit.

## 2022-06-17 NOTE — Telephone Encounter (Signed)
Patient was not able to connect to virtual visit today.  He will need an office visit.  Please schedule him to be seen with me on September 7th at 10:30 AM.  He is already aware of this appointment.

## 2022-06-21 ENCOUNTER — Other Ambulatory Visit: Payer: Medicare HMO

## 2022-06-21 DIAGNOSIS — C61 Malignant neoplasm of prostate: Secondary | ICD-10-CM | POA: Diagnosis not present
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
## 2022-06-22 ENCOUNTER — Ambulatory Visit (INDEPENDENT_AMBULATORY_CARE_PROVIDER_SITE_OTHER): Payer: Medicare HMO | Admitting: Gastroenterology

## 2022-06-22 ENCOUNTER — Encounter: Payer: Self-pay | Admitting: Gastroenterology

## 2022-06-22 VITALS — BP 118/71 | HR 75 | Temp 97.9°F | Ht 68.0 in | Wt 235.2 lb

## 2022-06-22 DIAGNOSIS — Z1211 Encounter for screening for malignant neoplasm of colon: Secondary | ICD-10-CM

## 2022-06-22 LAB — PSA: Prostate Specific Ag, Serum: <0.1 ng/mL (ref 0.0–4.0)

## 2022-06-22 NOTE — Patient Instructions (Signed)
We will arrange for you to have a colonoscopy in the near future with Dr. Abbey Chatters.   We will follow-up with you in the office as needed. Do not hesitate to call if you have any new GI concerns.   It was nice meeting you today!   Aliene Altes, PA-C [facility]

## 2022-06-23 ENCOUNTER — Telehealth: Payer: Self-pay | Admitting: *Deleted

## 2022-06-23 NOTE — Telephone Encounter (Signed)
LMOVM to call back to schedule TCS with Dr. Abbey Chatters ASA 3, needs UDS at pre-op

## 2022-06-27 ENCOUNTER — Telehealth: Payer: Self-pay

## 2022-06-27 ENCOUNTER — Ambulatory Visit: Payer: Medicare HMO | Admitting: Urology

## 2022-06-27 NOTE — Telephone Encounter (Signed)
Spoke with [employer]. Pharmacy voiced that patient declined Orgovyx and voiced understanding

## 2022-06-28 ENCOUNTER — Encounter: Payer: Self-pay | Admitting: *Deleted

## 2022-06-28 ENCOUNTER — Ambulatory Visit (INDEPENDENT_AMBULATORY_CARE_PROVIDER_SITE_OTHER): Payer: Medicare HMO | Admitting: Urology

## 2022-06-28 ENCOUNTER — Encounter: Payer: Self-pay | Admitting: Urology

## 2022-06-28 VITALS — BP 136/76 | HR 87

## 2022-06-28 DIAGNOSIS — R3912 Poor urinary stream: Secondary | ICD-10-CM | POA: Diagnosis not present

## 2022-06-28 DIAGNOSIS — N138 Other obstructive and reflux uropathy: Secondary | ICD-10-CM | POA: Diagnosis not present

## 2022-06-28 DIAGNOSIS — Z8546 Personal history of malignant neoplasm of prostate: Secondary | ICD-10-CM | POA: Diagnosis not present

## 2022-06-28 DIAGNOSIS — N401 Enlarged prostate with lower urinary tract symptoms: Secondary | ICD-10-CM

## 2022-06-28 DIAGNOSIS — C61 Malignant neoplasm of prostate: Secondary | ICD-10-CM

## 2022-06-28 MED ORDER — TAMSULOSIN HCL 0.4 MG PO CAPS
0.4000 mg | ORAL_CAPSULE | Freq: Every day | ORAL | 11 refills | Status: DC
Start: 2022-06-28 — End: 2023-08-06

## 2022-06-28 NOTE — Telephone Encounter (Signed)
LMOVM to call back to schedule colonoscopy. Will mail letter
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
## 2022-06-28 NOTE — Patient Instructions (Signed)

## 2022-07-11 ENCOUNTER — Encounter: Payer: Self-pay | Admitting: *Deleted

## 2022-07-11 ENCOUNTER — Telehealth: Payer: Self-pay | Admitting: *Deleted

## 2022-07-11 MED ORDER — PEG 3350-KCL-NA BICARB-NACL 420 G PO SOLR: 4000.0000 mL | Freq: Once | ORAL | 0 refills | Status: AC

## 2022-07-11 NOTE — Telephone Encounter (Signed)
[id number] Authorization #[id number]

## 2022-07-23 DIAGNOSIS — R0689 Other abnormalities of breathing: Secondary | ICD-10-CM | POA: Diagnosis not present

## 2022-07-23 DIAGNOSIS — R000 Tachycardia, unspecified: Secondary | ICD-10-CM | POA: Diagnosis not present

## 2022-07-23 DIAGNOSIS — R0902 Hypoxemia: Secondary | ICD-10-CM | POA: Diagnosis not present

## 2022-07-23 DIAGNOSIS — R55 Syncope and collapse: Secondary | ICD-10-CM | POA: Diagnosis not present

## 2022-07-23 DIAGNOSIS — R404 Transient alteration of awareness: Secondary | ICD-10-CM | POA: Diagnosis not present

## 2022-07-25 ENCOUNTER — Other Ambulatory Visit (HOSPITAL_COMMUNITY)
Admission: RE | Admit: 2022-07-25 | Discharge: 2022-07-25 | Disposition: A | Discharge status: Home / Self Care | Payer: Medicare HMO | Source: Ambulatory Visit | Attending: Internal Medicine | Admitting: Internal Medicine

## 2022-07-25 DIAGNOSIS — F1411 Cocaine abuse, in remission: Secondary | ICD-10-CM | POA: Diagnosis present

## 2022-07-25 DIAGNOSIS — C61 Malignant neoplasm of prostate: Secondary | ICD-10-CM | POA: Diagnosis not present

## 2022-07-25 LAB — PSA: Prostatic Specific Antigen: 0.01 ng/mL (ref 0.00–4.00)

## 2022-07-25 NOTE — Patient Instructions (Signed)
Jerome Daniel  07/25/2022     '@PREFPERIOPPHARMACY'$ @   Your procedure is scheduled on  07/29/2022.   Report to [facility] at  0900  A.M.   Call this number if you have problems the morning of surgery:  [phone]   Remember:  Follow the diet and prep instructions given to you by the office.         Use your inhalers before you come and bring your rescue inhaler with you.     Take these medicines the morning of surgery with A SIP OF WATER                                      None.    Do not wear jewelry, make-up or nail polish.  Do not wear lotions, powders, or perfumes, or deodorant.  Do not shave 48 hours prior to surgery.  Men may shave face and neck.  Do not bring valuables to the hospital.  [facility] is not responsible for any belongings or valuables.  Contacts, dentures or bridgework may not be worn into surgery.  Leave your suitcase in the car.  After surgery it may be brought to your room.  For patients admitted to the hospital, discharge time will be determined by your treatment team.  Patients discharged the day of surgery will not be allowed to drive home and must have someone with them for 24 hours.    Special instructions:   DO NOT smoke tobacco or vape for 24 hours before your procedure.  Please read over the following fact sheets that you were given. Anesthesia Post-op Instructions and Care and Recovery After Surgery      Colonoscopy, Adult, Care After The following information offers guidance on how to care for yourself after your procedure. Your health care provider may also give you more specific instructions. If you have problems or questions, contact your health care provider. What can I expect after the procedure? After the procedure, it is common to have: A small amount of blood in your stool for 24 hours after the procedure. Some gas. Mild cramping or bloating of your abdomen. Follow these instructions at home: Eating and  drinking  Drink enough fluid to keep your urine pale yellow. Follow instructions from your health care provider about eating or drinking restrictions. Resume your normal diet as told by your health care provider. Avoid heavy or fried foods that are hard to digest. Activity Rest as told by your health care provider. Avoid sitting for a long time without moving. Get up to take short walks every 1-2 hours. This is important to improve blood flow and breathing. Ask for help if you feel weak or unsteady. Return to your normal activities as told by your health care provider. Ask your health care provider what activities are safe for you. Managing cramping and bloating  Try walking around when you have cramps or feel bloated. If directed, apply heat to your abdomen as told by your health care provider. Use the heat source that your health care provider recommends, such as a moist heat pack or a heating pad. Place a towel between your skin and the heat source. Leave the heat on for 20-30 minutes. Remove the heat if your skin turns bright red. This is especially important if you are unable to feel pain, heat, or cold. You have a greater  risk of getting burned. General instructions If you were given a sedative during the procedure, it can affect you for several hours. Do not drive or operate machinery until your health care provider says that it is safe. For the first 24 hours after the procedure: Do not sign important documents. Do not drink alcohol. Do your regular daily activities at a slower pace than normal. Eat soft foods that are easy to digest. Take over-the-counter and prescription medicines only as told by your health care provider. Keep all follow-up visits. This is important. Contact a health care provider if: You have blood in your stool 2-3 days after the procedure. Get help right away if: You have more than a small spotting of blood in your stool. You have large blood clots in your  stool. You have swelling of your abdomen. You have nausea or vomiting. You have a fever. You have increasing pain in your abdomen that is not relieved with medicine. These symptoms may be an emergency. Get help right away. Call 911. Do not wait to see if the symptoms will go away. Do not drive yourself to the hospital. Summary After the procedure, it is common to have a small amount of blood in your stool. You may also have mild cramping and bloating of your abdomen. If you were given a sedative during the procedure, it can affect you for several hours. Do not drive or operate machinery until your health care provider says that it is safe. Get help right away if you have a lot of blood in your stool, nausea or vomiting, a fever, or increased pain in your abdomen. This information is not intended to replace advice given to you by your health care provider. Make sure you discuss any questions you have with your health care provider. Document Revised: 05/26/2021 Document Reviewed: 05/26/2021 Elsevier Patient Education  [place] After This sheet gives you information about how to care for yourself after your procedure. Your health care provider may also give you more specific instructions. If you have problems or questions, contact your health care provider. What can I expect after the procedure? After the procedure, it is common to have: Tiredness. Forgetfulness about what happened after the procedure. Impaired judgment for important decisions. Nausea or vomiting. Some difficulty with balance. Follow these instructions at home: For the time period you were told by your health care provider:     Rest as needed. Do not participate in activities where you could fall or become injured. Do not drive or use machinery. Do not drink alcohol. Do not take sleeping pills or medicines that cause drowsiness. Do not make important decisions or sign legal  documents. Do not take care of children on your own. Eating and drinking Follow the diet that is recommended by your health care provider. Drink enough fluid to keep your urine pale yellow. If you vomit: Drink water, juice, or soup when you can drink without vomiting. Make sure you have little or no nausea before eating solid foods. General instructions Have a responsible adult stay with you for the time you are told. It is important to have someone help care for you until you are awake and alert. Take over-the-counter and prescription medicines only as told by your health care provider. If you have sleep apnea, surgery and certain medicines can increase your risk for breathing problems. Follow instructions from your health care provider about wearing your sleep device: Anytime you are sleeping, including during  daytime naps. While taking prescription pain medicines, sleeping medicines, or medicines that make you drowsy. Avoid smoking. Keep all follow-up visits as told by your health care provider. This is important. Contact a health care provider if: You keep feeling nauseous or you keep vomiting. You feel light-headed. You are still sleepy or having trouble with balance after 24 hours. You develop a rash. You have a fever. You have redness or swelling around the IV site. Get help right away if: You have trouble breathing. You have new-onset confusion at home. Summary For several hours after your procedure, you may feel tired. You may also be forgetful and have poor judgment. Have a responsible adult stay with you for the time you are told. It is important to have someone help care for you until you are awake and alert. Rest as told. Do not drive or operate machinery. Do not drink alcohol or take sleeping pills. Get help right away if you have trouble breathing, or if you suddenly become confused. This information is not intended to replace advice given to you by your health care  provider. Make sure you discuss any questions you have with your health care provider. Document Revised: 09/07/2021 Document Reviewed: 09/05/2019 Elsevier Patient Education  [place].

## 2022-07-26 ENCOUNTER — Encounter (HOSPITAL_COMMUNITY)
Admission: RE | Admit: 2022-07-26 | Discharge: 2022-07-26 | Disposition: A | Discharge status: Home / Self Care | Payer: Medicare HMO | Source: Ambulatory Visit | Attending: Internal Medicine | Admitting: Internal Medicine

## 2022-07-26 ENCOUNTER — Encounter (HOSPITAL_COMMUNITY): Payer: Self-pay

## 2022-07-26 VITALS — BP 109/56 | HR 56 | Temp 97.8°F | Resp 18 | Ht 68.0 in | Wt 235.2 lb

## 2022-07-26 DIAGNOSIS — J449 Chronic obstructive pulmonary disease, unspecified: Secondary | ICD-10-CM | POA: Diagnosis not present

## 2022-07-26 DIAGNOSIS — F1411 Cocaine abuse, in remission: Secondary | ICD-10-CM | POA: Diagnosis not present

## 2022-07-26 DIAGNOSIS — Z01818 Encounter for other preprocedural examination: Secondary | ICD-10-CM | POA: Diagnosis not present

## 2022-07-26 LAB — RAPID URINE DRUG SCREEN, HOSP PERFORMED
Amphetamines: NOT DETECTED
Barbiturates: NOT DETECTED
Benzodiazepines: NOT DETECTED
Cocaine: NOT DETECTED
Opiates: POSITIVE — AB
Tetrahydrocannabinol: NOT DETECTED

## 2022-07-29 ENCOUNTER — Encounter (HOSPITAL_COMMUNITY)
Admission: RE | Disposition: A | Discharge status: Home / Self Care | Payer: Self-pay | Source: Home / Self Care | Attending: Internal Medicine

## 2022-07-29 ENCOUNTER — Ambulatory Visit (HOSPITAL_BASED_OUTPATIENT_CLINIC_OR_DEPARTMENT_OTHER): Payer: Medicare HMO | Admitting: Anesthesiology

## 2022-07-29 ENCOUNTER — Ambulatory Visit (HOSPITAL_COMMUNITY): Payer: Medicare HMO | Admitting: Anesthesiology

## 2022-07-29 ENCOUNTER — Encounter (HOSPITAL_COMMUNITY): Payer: Self-pay

## 2022-07-29 ENCOUNTER — Ambulatory Visit (HOSPITAL_COMMUNITY)
Admission: RE | Admit: 2022-07-29 | Discharge: 2022-07-29 | Disposition: A | Discharge status: Home / Self Care | Payer: Medicare HMO | Attending: Internal Medicine | Admitting: Internal Medicine

## 2022-07-29 DIAGNOSIS — Z87891 Personal history of nicotine dependence: Secondary | ICD-10-CM | POA: Diagnosis not present

## 2022-07-29 DIAGNOSIS — K648 Other hemorrhoids: Secondary | ICD-10-CM | POA: Diagnosis not present

## 2022-07-29 DIAGNOSIS — D123 Benign neoplasm of transverse colon: Secondary | ICD-10-CM | POA: Insufficient documentation

## 2022-07-29 DIAGNOSIS — Z8546 Personal history of malignant neoplasm of prostate: Secondary | ICD-10-CM | POA: Diagnosis not present

## 2022-07-29 DIAGNOSIS — K635 Polyp of colon: Secondary | ICD-10-CM | POA: Diagnosis not present

## 2022-07-29 DIAGNOSIS — J449 Chronic obstructive pulmonary disease, unspecified: Secondary | ICD-10-CM | POA: Insufficient documentation

## 2022-07-29 DIAGNOSIS — K5730 Diverticulosis of large intestine without perforation or abscess without bleeding: Secondary | ICD-10-CM

## 2022-07-29 DIAGNOSIS — D124 Benign neoplasm of descending colon: Secondary | ICD-10-CM | POA: Diagnosis not present

## 2022-07-29 DIAGNOSIS — Z1211 Encounter for screening for malignant neoplasm of colon: Secondary | ICD-10-CM

## 2022-07-29 DIAGNOSIS — Z1212 Encounter for screening for malignant neoplasm of rectum: Secondary | ICD-10-CM

## 2022-07-29 HISTORY — PX: COLONOSCOPY WITH PROPOFOL: SHX5780

## 2022-07-29 HISTORY — PX: POLYPECTOMY: SHX5525

## 2022-07-29 SURGERY — COLONOSCOPY WITH PROPOFOL
Anesthesia: General

## 2022-07-29 MED ORDER — PROPOFOL 10 MG/ML IV BOLUS
INTRAVENOUS | Status: DC | PRN
  Administered 2022-07-29: 80 mg via INTRAVENOUS
  Administered 2022-07-29 (×3): 20 mg via INTRAVENOUS

## 2022-07-29 MED ORDER — LIDOCAINE HCL (CARDIAC) PF 100 MG/5ML IV SOSY
PREFILLED_SYRINGE | INTRAVENOUS | Status: DC | PRN
  Administered 2022-07-29: 60 mg via INTRAVENOUS

## 2022-07-29 MED ORDER — LACTATED RINGERS IV SOLN
INTRAVENOUS | Status: DC
  Administered 2022-07-29: 1000 mL via INTRAVENOUS

## 2022-07-29 MED ORDER — PROPOFOL 500 MG/50ML IV EMUL
INTRAVENOUS | Status: DC | PRN
  Administered 2022-07-29: 150 ug/kg/min via INTRAVENOUS
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
## 2022-07-29 NOTE — Anesthesia Postprocedure Evaluation (Signed)
Anesthesia Post Note  Patient: Jerome Daniel  Procedure(s) Performed: COLONOSCOPY WITH PROPOFOL POLYPECTOMY  Patient location during evaluation: Phase II Anesthesia Type: General Level of consciousness: awake and alert and oriented Pain management: pain level controlled Vital Signs Assessment: post-procedure vital signs reviewed and stable Respiratory status: spontaneous breathing, nonlabored ventilation and respiratory function stable Cardiovascular status: blood pressure returned to baseline and stable Postop Assessment: no apparent nausea or vomiting Anesthetic complications: no   No notable events documented.   Last Vitals:  Vitals:   07/29/22 1024 07/29/22 1029  BP: (!) 87/46 108/75  Pulse: 64   Resp: 15   Temp: 36.5 C   SpO2: 96%     Last Pain:  Vitals:   07/29/22 1024  TempSrc: Oral  PainSc: 0-No pain                 Jamon Hayhurst C Arryana Tolleson

## 2022-07-29 NOTE — Op Note (Signed)
[facility] Patient Name: Jerome Daniel Procedure Date: 07/29/2022 9:39 AM MRN: [id number] Date of Birth: 1960/10/19 Attending MD: Elon Alas. Abbey Chatters DO CSN: [id number] Age: 61 Admit Type: Outpatient Procedure:                Colonoscopy Indications:              Screening for colorectal malignant neoplasm Providers:                Elon Alas. Abbey Chatters, DO, Jessica Boudreaux, Casimer Bilis, Technician Referring MD:             Elon Alas. Abbey Chatters, DO Medicines:                See the Anesthesia note for documentation of the                            administered medications Complications:            No immediate complications. Estimated Blood Loss:     Estimated blood loss was minimal. Procedure:                Pre-Anesthesia Assessment:                           - The anesthesia plan was to use monitored                            anesthesia care (MAC).                           After obtaining informed consent, the colonoscope                            was passed under direct vision. Throughout the                            procedure, the patient's blood pressure, pulse, and                            oxygen saturations were monitored continuously. The                            [id number]-HQ190L ([id number]) scope was introduced through                            the anus and advanced to the the cecum, identified                            by appendiceal orifice and ileocecal valve. The                            colonoscopy was performed without difficulty. The                            patient tolerated the  procedure well. The quality                            of the bowel preparation was evaluated using the                            BBPS [facility] Bowel Preparation Scale) with scores                            of: Right Colon = 2 (minor amount of residual                            staining, small fragments of stool and/or opaque                             liquid, but mucosa seen well), Transverse Colon = 2                            (minor amount of residual staining, small fragments                            of stool and/or opaque liquid, but mucosa seen                            well) and Left Colon = 2 (minor amount of residual                            staining, small fragments of stool and/or opaque                            liquid, but mucosa seen well). The total BBPS score                            equals 6. The quality of the bowel preparation was                            fair. Scope In: 9:58:08 AM Scope Out: 10:19:24 AM Scope Withdrawal Time: 0 hours 20 minutes 4 seconds  Total Procedure Duration: 0 hours 21 minutes 16 seconds  Findings:      The perianal and digital rectal examinations were normal.      Non-bleeding internal hemorrhoids were found during endoscopy.      A few small-mouthed diverticula were found in the sigmoid colon.      Five sessile polyps were found in the descending colon and transverse       colon. The polyps were 3 to 7 mm in size. These polyps were removed with       a cold snare. Resection and retrieval were complete.      The exam was otherwise without abnormality. Impression:               - Preparation of the colon was fair.                           -  Non-bleeding internal hemorrhoids.                           - Diverticulosis in the sigmoid colon.                           - Five 3 to 7 mm polyps in the descending colon and                            in the transverse colon, removed with a cold snare.                            Resected and retrieved.                           - The examination was otherwise normal. Moderate Sedation:      Per Anesthesia Care Recommendation:           - Patient has a contact number available for                            emergencies. The signs and symptoms of potential                            delayed complications were discussed with the                             patient. Return to normal activities tomorrow.                            Written discharge instructions were provided to the                            patient.                           - Resume previous diet.                           - Continue present medications.                           - Await pathology results.                           - Repeat colonoscopy in 3 - 5 years for                            surveillance.                           - Return to GI clinic PRN. Procedure Code(s):        --- Professional ---                           [phone], Colonoscopy, flexible; with removal of  tumor(s), polyp(s), or other lesion(s) by snare                            technique Diagnosis Code(s):        --- Professional ---                           Z12.11, Encounter for screening for malignant                            neoplasm of colon                           K64.8, Other hemorrhoids                           K63.5, Polyp of colon                           K57.30, Diverticulosis of large intestine without                            perforation or abscess without bleeding CPT copyright 2019 American Medical Association. All rights reserved. The codes documented in this report are preliminary and upon coder review may  be revised to meet current compliance requirements. Elon Alas. Abbey Chatters, DO Jerome Abbey Chatters, DO 07/29/2022 10:24:41 AM This report has been signed electronically. Number of Addenda: 0
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
## 2022-07-29 NOTE — Transfer of Care (Signed)
Immediate Anesthesia Transfer of Care Note  Patient: Jerome Daniel  Procedure(s) Performed: COLONOSCOPY WITH PROPOFOL POLYPECTOMY  Patient Location: Short Stay  Anesthesia Type:General  Level of Consciousness: awake, alert , oriented and patient cooperative  Airway & Oxygen Therapy: Patient Spontanous Breathing  Post-op Assessment: Report given to RN, Post -op Vital signs reviewed and stable and Patient moving all extremities X 4  Post vital signs: Reviewed and stable  Last Vitals:  Vitals Value Taken Time  BP 108/75 07/29/22 1029  Temp 36.5 C 07/29/22 1024  Pulse 64 07/29/22 1024  Resp 15 07/29/22 1024  SpO2 96 % 07/29/22 1024    Last Pain:  Vitals:   07/29/22 1024  TempSrc: Oral  PainSc: 0-No pain      Patients Stated Pain Goal: 7 (34/03/52 4818)  Complications: No notable events documented.

## 2022-07-29 NOTE — Discharge Instructions (Addendum)
  Colonoscopy Discharge Instructions  Read the instructions outlined below and refer to this sheet in the next few weeks. These discharge instructions provide you with general information on caring for yourself after you leave the hospital. Your doctor may also give you specific instructions. While your treatment has been planned according to the most current medical practices available, unavoidable complications occasionally occur.   ACTIVITY You may resume your regular activity, but move at a slower pace for the next 24 hours.  Take frequent rest periods for the next 24 hours.  Walking will help get rid of the air and reduce the bloated feeling in your belly (abdomen).  No driving for 24 hours (because of the medicine (anesthesia) used during the test).   Do not sign any important legal documents or operate any machinery for 24 hours (because of the anesthesia used during the test).  NUTRITION Drink plenty of fluids.  You may resume your normal diet as instructed by your doctor.  Begin with a light meal and progress to your normal diet. Heavy or fried foods are harder to digest and may make you feel sick to your stomach (nauseated).  Avoid alcoholic beverages for 24 hours or as instructed.  MEDICATIONS You may resume your normal medications unless your doctor tells you otherwise.  WHAT YOU CAN EXPECT TODAY Some feelings of bloating in the abdomen.  Passage of more gas than usual.  Spotting of blood in your stool or on the toilet paper.  IF YOU HAD POLYPS REMOVED DURING THE COLONOSCOPY: No aspirin products for 7 days or as instructed.  No alcohol for 7 days or as instructed.  Eat a soft diet for the next 24 hours.  FINDING OUT THE RESULTS OF YOUR TEST Not all test results are available during your visit. If your test results are not back during the visit, make an appointment with your caregiver to find out the results. Do not assume everything is normal if you have not heard from your  caregiver or the medical facility. It is important for you to follow up on all of your test results.  SEEK IMMEDIATE MEDICAL ATTENTION IF: You have more than a spotting of blood in your stool.  Your belly is swollen (abdominal distention).  You are nauseated or vomiting.  You have a temperature over 101.  You have abdominal pain or discomfort that is severe or gets worse throughout the day.   Your colonoscopy revealed 5 polyp(s) which I removed successfully. Await pathology results, my office will contact you. I recommend repeating colonoscopy in 3-5 years for surveillance purposes.   You also have diverticulosis and internal hemorrhoids. I would recommend increasing fiber in your diet or adding OTC Benefiber/Metamucil. Be sure to drink at least 4 to 6 glasses of water daily. Follow-up with GI as needed.   I hope you have a great rest of your week!  Elon Alas. Abbey Chatters, D.O. Gastroenterology and Hepatology [facility]

## 2022-08-01 LAB — SURGICAL PATHOLOGY

## 2022-08-02 NOTE — Progress Notes (Signed)
Letter sent.

## 2022-08-03 ENCOUNTER — Encounter (HOSPITAL_COMMUNITY): Payer: Self-pay | Admitting: Internal Medicine

## 2022-08-10 DIAGNOSIS — M5442 Lumbago with sciatica, left side: Secondary | ICD-10-CM | POA: Diagnosis not present

## 2022-08-10 DIAGNOSIS — Z0000 Encounter for general adult medical examination without abnormal findings: Secondary | ICD-10-CM | POA: Diagnosis not present

## 2022-08-10 DIAGNOSIS — Z6833 Body mass index (BMI) 33.0-33.9, adult: Secondary | ICD-10-CM | POA: Diagnosis not present

## 2022-08-10 DIAGNOSIS — E669 Obesity, unspecified: Secondary | ICD-10-CM | POA: Diagnosis not present

## 2022-08-10 DIAGNOSIS — J449 Chronic obstructive pulmonary disease, unspecified: Secondary | ICD-10-CM | POA: Diagnosis not present

## 2022-10-24 DIAGNOSIS — M545 Low back pain, unspecified: Secondary | ICD-10-CM | POA: Diagnosis not present

## 2022-10-24 DIAGNOSIS — Z79899 Other long term (current) drug therapy: Secondary | ICD-10-CM | POA: Diagnosis not present

## 2022-10-24 DIAGNOSIS — M79605 Pain in left leg: Secondary | ICD-10-CM | POA: Diagnosis not present

## 2022-10-24 DIAGNOSIS — M546 Pain in thoracic spine: Secondary | ICD-10-CM | POA: Diagnosis not present

## 2022-10-24 DIAGNOSIS — M129 Arthropathy, unspecified: Secondary | ICD-10-CM | POA: Diagnosis not present

## 2022-10-24 DIAGNOSIS — G8929 Other chronic pain: Secondary | ICD-10-CM | POA: Diagnosis not present

## 2022-10-24 DIAGNOSIS — Z6834 Body mass index (BMI) 34.0-34.9, adult: Secondary | ICD-10-CM | POA: Diagnosis not present

## 2022-10-24 DIAGNOSIS — M79604 Pain in right leg: Secondary | ICD-10-CM | POA: Diagnosis not present

## 2022-10-27 DIAGNOSIS — Z79899 Other long term (current) drug therapy: Secondary | ICD-10-CM | POA: Diagnosis not present

## 2022-10-31 DIAGNOSIS — R030 Elevated blood-pressure reading, without diagnosis of hypertension: Secondary | ICD-10-CM | POA: Diagnosis not present

## 2022-10-31 DIAGNOSIS — Z79899 Other long term (current) drug therapy: Secondary | ICD-10-CM | POA: Diagnosis not present

## 2022-10-31 DIAGNOSIS — M79605 Pain in left leg: Secondary | ICD-10-CM | POA: Diagnosis not present

## 2022-10-31 DIAGNOSIS — M546 Pain in thoracic spine: Secondary | ICD-10-CM | POA: Diagnosis not present

## 2022-10-31 DIAGNOSIS — M129 Arthropathy, unspecified: Secondary | ICD-10-CM | POA: Diagnosis not present

## 2022-10-31 DIAGNOSIS — Z6834 Body mass index (BMI) 34.0-34.9, adult: Secondary | ICD-10-CM | POA: Diagnosis not present

## 2022-10-31 DIAGNOSIS — M79604 Pain in right leg: Secondary | ICD-10-CM | POA: Diagnosis not present

## 2022-10-31 DIAGNOSIS — M545 Low back pain, unspecified: Secondary | ICD-10-CM | POA: Diagnosis not present

## 2022-11-03 DIAGNOSIS — Z79899 Other long term (current) drug therapy: Secondary | ICD-10-CM | POA: Diagnosis not present

## 2022-12-01 DIAGNOSIS — R030 Elevated blood-pressure reading, without diagnosis of hypertension: Secondary | ICD-10-CM | POA: Diagnosis not present

## 2022-12-01 DIAGNOSIS — M546 Pain in thoracic spine: Secondary | ICD-10-CM | POA: Diagnosis not present

## 2022-12-01 DIAGNOSIS — M79604 Pain in right leg: Secondary | ICD-10-CM | POA: Diagnosis not present

## 2022-12-01 DIAGNOSIS — M545 Low back pain, unspecified: Secondary | ICD-10-CM | POA: Diagnosis not present

## 2022-12-01 DIAGNOSIS — M79605 Pain in left leg: Secondary | ICD-10-CM | POA: Diagnosis not present

## 2022-12-01 DIAGNOSIS — Z79899 Other long term (current) drug therapy: Secondary | ICD-10-CM | POA: Diagnosis not present

## 2022-12-01 DIAGNOSIS — Z6835 Body mass index (BMI) 35.0-35.9, adult: Secondary | ICD-10-CM | POA: Diagnosis not present

## 2022-12-05 DIAGNOSIS — Z79899 Other long term (current) drug therapy: Secondary | ICD-10-CM | POA: Diagnosis not present

## 2022-12-16 ENCOUNTER — Other Ambulatory Visit: Payer: Medicare HMO

## 2022-12-16 DIAGNOSIS — R972 Elevated prostate specific antigen [PSA]: Secondary | ICD-10-CM | POA: Diagnosis not present

## 2022-12-16 DIAGNOSIS — C61 Malignant neoplasm of prostate: Secondary | ICD-10-CM

## 2022-12-17 LAB — PSA: Prostate Specific Ag, Serum: 0.2 ng/mL (ref 0.0–4.0)

## 2022-12-20 DIAGNOSIS — J449 Chronic obstructive pulmonary disease, unspecified: Secondary | ICD-10-CM | POA: Diagnosis not present

## 2022-12-20 DIAGNOSIS — C61 Malignant neoplasm of prostate: Secondary | ICD-10-CM | POA: Diagnosis not present

## 2022-12-20 DIAGNOSIS — R69 Illness, unspecified: Secondary | ICD-10-CM | POA: Diagnosis not present

## 2022-12-20 DIAGNOSIS — Z6834 Body mass index (BMI) 34.0-34.9, adult: Secondary | ICD-10-CM | POA: Diagnosis not present

## 2022-12-20 DIAGNOSIS — Z8546 Personal history of malignant neoplasm of prostate: Secondary | ICD-10-CM | POA: Diagnosis not present

## 2022-12-20 DIAGNOSIS — M5442 Lumbago with sciatica, left side: Secondary | ICD-10-CM | POA: Diagnosis not present

## 2022-12-20 DIAGNOSIS — E669 Obesity, unspecified: Secondary | ICD-10-CM | POA: Diagnosis not present

## 2022-12-20 DIAGNOSIS — Z0000 Encounter for general adult medical examination without abnormal findings: Secondary | ICD-10-CM | POA: Diagnosis not present

## 2022-12-20 DIAGNOSIS — G2581 Restless legs syndrome: Secondary | ICD-10-CM | POA: Diagnosis not present

## 2022-12-23 ENCOUNTER — Ambulatory Visit (INDEPENDENT_AMBULATORY_CARE_PROVIDER_SITE_OTHER): Payer: Medicare HMO | Admitting: Urology

## 2022-12-23 ENCOUNTER — Encounter: Payer: Self-pay | Admitting: Urology

## 2022-12-23 VITALS — BP 123/75 | HR 71

## 2022-12-23 DIAGNOSIS — R3912 Poor urinary stream: Secondary | ICD-10-CM | POA: Diagnosis not present

## 2022-12-23 DIAGNOSIS — N138 Other obstructive and reflux uropathy: Secondary | ICD-10-CM | POA: Diagnosis not present

## 2022-12-23 DIAGNOSIS — C61 Malignant neoplasm of prostate: Secondary | ICD-10-CM | POA: Diagnosis not present

## 2022-12-23 DIAGNOSIS — N401 Enlarged prostate with lower urinary tract symptoms: Secondary | ICD-10-CM | POA: Diagnosis not present
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
## 2022-12-23 NOTE — Patient Instructions (Signed)

## 2023-03-21 DIAGNOSIS — Z8546 Personal history of malignant neoplasm of prostate: Secondary | ICD-10-CM | POA: Diagnosis not present

## 2023-03-21 DIAGNOSIS — Z0000 Encounter for general adult medical examination without abnormal findings: Secondary | ICD-10-CM | POA: Diagnosis not present

## 2023-03-21 DIAGNOSIS — M5442 Lumbago with sciatica, left side: Secondary | ICD-10-CM | POA: Diagnosis not present

## 2023-03-21 DIAGNOSIS — E669 Obesity, unspecified: Secondary | ICD-10-CM | POA: Diagnosis not present

## 2023-03-21 DIAGNOSIS — J449 Chronic obstructive pulmonary disease, unspecified: Secondary | ICD-10-CM | POA: Diagnosis not present

## 2023-03-21 DIAGNOSIS — Z6834 Body mass index (BMI) 34.0-34.9, adult: Secondary | ICD-10-CM | POA: Diagnosis not present

## 2023-03-21 DIAGNOSIS — F1010 Alcohol abuse, uncomplicated: Secondary | ICD-10-CM | POA: Diagnosis not present

## 2023-03-21 DIAGNOSIS — G2581 Restless legs syndrome: Secondary | ICD-10-CM | POA: Diagnosis not present

## 2023-03-27 ENCOUNTER — Other Ambulatory Visit: Payer: Medicare HMO

## 2023-03-31 ENCOUNTER — Other Ambulatory Visit: Payer: Medicare HMO

## 2023-04-03 ENCOUNTER — Other Ambulatory Visit: Payer: Medicare HMO

## 2023-04-03 DIAGNOSIS — C61 Malignant neoplasm of prostate: Secondary | ICD-10-CM | POA: Diagnosis not present

## 2023-04-04 LAB — PSA: Prostate Specific Ag, Serum: 0.2 ng/mL (ref 0.0–4.0)

## 2023-04-07 ENCOUNTER — Ambulatory Visit: Payer: Medicare HMO | Admitting: Urology

## 2023-04-07 DIAGNOSIS — C61 Malignant neoplasm of prostate: Secondary | ICD-10-CM

## 2023-04-12 ENCOUNTER — Ambulatory Visit (INDEPENDENT_AMBULATORY_CARE_PROVIDER_SITE_OTHER): Payer: Medicare HMO | Admitting: Urology

## 2023-04-12 VITALS — BP 104/70 | HR 78

## 2023-04-12 DIAGNOSIS — N138 Other obstructive and reflux uropathy: Secondary | ICD-10-CM | POA: Diagnosis not present

## 2023-04-12 DIAGNOSIS — N401 Enlarged prostate with lower urinary tract symptoms: Secondary | ICD-10-CM | POA: Diagnosis not present

## 2023-04-12 DIAGNOSIS — C61 Malignant neoplasm of prostate: Secondary | ICD-10-CM | POA: Diagnosis not present

## 2023-04-12 DIAGNOSIS — R3912 Poor urinary stream: Secondary | ICD-10-CM

## 2023-04-12 LAB — URINALYSIS, ROUTINE W REFLEX MICROSCOPIC
Bilirubin, UA: NEGATIVE
Glucose, UA: NEGATIVE
Ketones, UA: NEGATIVE
Leukocytes,UA: NEGATIVE
Nitrite, UA: NEGATIVE
Protein,UA: NEGATIVE
RBC, UA: NEGATIVE
Specific Gravity, UA: 1.01 (ref 1.005–1.030)
Urobilinogen, Ur: 4 mg/dL — ABNORMAL HIGH (ref 0.2–1.0)
pH, UA: 6.5 (ref 5.0–7.5)
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
## 2023-04-16 ENCOUNTER — Encounter: Payer: Self-pay | Admitting: Urology

## 2023-04-16 NOTE — Patient Instructions (Signed)

## 2023-06-01 DIAGNOSIS — F1010 Alcohol abuse, uncomplicated: Secondary | ICD-10-CM | POA: Diagnosis not present

## 2023-06-01 DIAGNOSIS — Z8249 Family history of ischemic heart disease and other diseases of the circulatory system: Secondary | ICD-10-CM | POA: Diagnosis not present

## 2023-06-01 DIAGNOSIS — J449 Chronic obstructive pulmonary disease, unspecified: Secondary | ICD-10-CM | POA: Diagnosis not present

## 2023-06-01 DIAGNOSIS — Z6835 Body mass index (BMI) 35.0-35.9, adult: Secondary | ICD-10-CM | POA: Diagnosis not present

## 2023-06-01 DIAGNOSIS — M1990 Unspecified osteoarthritis, unspecified site: Secondary | ICD-10-CM | POA: Diagnosis not present

## 2023-06-01 DIAGNOSIS — Z8546 Personal history of malignant neoplasm of prostate: Secondary | ICD-10-CM | POA: Diagnosis not present

## 2023-06-01 DIAGNOSIS — Z833 Family history of diabetes mellitus: Secondary | ICD-10-CM | POA: Diagnosis not present

## 2023-06-01 DIAGNOSIS — Z7951 Long term (current) use of inhaled steroids: Secondary | ICD-10-CM | POA: Diagnosis not present

## 2023-06-01 DIAGNOSIS — Z87891 Personal history of nicotine dependence: Secondary | ICD-10-CM | POA: Diagnosis not present

## 2023-06-01 DIAGNOSIS — R600 Localized edema: Secondary | ICD-10-CM | POA: Diagnosis not present

## 2023-06-01 DIAGNOSIS — G2581 Restless legs syndrome: Secondary | ICD-10-CM | POA: Diagnosis not present

## 2023-06-01 DIAGNOSIS — Z008 Encounter for other general examination: Secondary | ICD-10-CM | POA: Diagnosis not present

## 2023-07-07 ENCOUNTER — Other Ambulatory Visit: Payer: Self-pay

## 2023-07-07 ENCOUNTER — Emergency Department (HOSPITAL_COMMUNITY): Payer: Medicare HMO

## 2023-07-07 ENCOUNTER — Emergency Department (HOSPITAL_COMMUNITY)
Admission: EM | Admit: 2023-07-07 | Discharge: 2023-07-07 | Disposition: A | Discharge status: Home / Self Care | Payer: Medicare HMO | Attending: Emergency Medicine | Admitting: Emergency Medicine

## 2023-07-07 ENCOUNTER — Encounter (HOSPITAL_COMMUNITY): Payer: Self-pay

## 2023-07-07 DIAGNOSIS — Z7951 Long term (current) use of inhaled steroids: Secondary | ICD-10-CM | POA: Diagnosis not present

## 2023-07-07 DIAGNOSIS — J45909 Unspecified asthma, uncomplicated: Secondary | ICD-10-CM | POA: Insufficient documentation

## 2023-07-07 DIAGNOSIS — J181 Lobar pneumonia, unspecified organism: Secondary | ICD-10-CM | POA: Insufficient documentation

## 2023-07-07 DIAGNOSIS — Z720 Tobacco use: Secondary | ICD-10-CM | POA: Diagnosis not present

## 2023-07-07 DIAGNOSIS — J189 Pneumonia, unspecified organism: Secondary | ICD-10-CM

## 2023-07-07 DIAGNOSIS — R0602 Shortness of breath: Secondary | ICD-10-CM | POA: Diagnosis not present

## 2023-07-07 DIAGNOSIS — R918 Other nonspecific abnormal finding of lung field: Secondary | ICD-10-CM | POA: Diagnosis not present

## 2023-07-07 DIAGNOSIS — Z1152 Encounter for screening for COVID-19: Secondary | ICD-10-CM | POA: Diagnosis not present

## 2023-07-07 DIAGNOSIS — J449 Chronic obstructive pulmonary disease, unspecified: Secondary | ICD-10-CM | POA: Insufficient documentation

## 2023-07-07 DIAGNOSIS — J168 Pneumonia due to other specified infectious organisms: Secondary | ICD-10-CM | POA: Diagnosis not present

## 2023-07-07 LAB — CBC
HCT: 40.7 % (ref 39.0–52.0)
Hemoglobin: 13.6 g/dL (ref 13.0–17.0)
MCH: 34 pg (ref 26.0–34.0)
MCHC: 33.4 g/dL (ref 30.0–36.0)
MCV: 101.8 fL — ABNORMAL HIGH (ref 80.0–100.0)
Platelets: 290 10*3/uL (ref 150–400)
RBC: 4 MIL/uL — ABNORMAL LOW (ref 4.22–5.81)
RDW: 12.3 % (ref 11.5–15.5)
WBC: 7.9 10*3/uL (ref 4.0–10.5)
nRBC: 0 % (ref 0.0–0.2)

## 2023-07-07 LAB — BASIC METABOLIC PANEL
Anion gap: 8 (ref 5–15)
BUN: 10 mg/dL (ref 8–23)
CO2: 25 mmol/L (ref 22–32)
Calcium: 8.2 mg/dL — ABNORMAL LOW (ref 8.9–10.3)
Chloride: 104 mmol/L (ref 98–111)
Creatinine, Ser: 0.81 mg/dL (ref 0.61–1.24)
GFR, Estimated: >60 mL/min (ref >60)
Glucose, Bld: 116 mg/dL — ABNORMAL HIGH (ref 70–99)
Potassium: 3.4 mmol/L — ABNORMAL LOW (ref 3.5–5.1)
Sodium: 137 mmol/L (ref 135–145)

## 2023-07-07 LAB — RESP PANEL BY RT-PCR (RSV, FLU A&B, COVID)  RVPGX2
Influenza A by PCR: NEGATIVE
Influenza B by PCR: NEGATIVE
Resp Syncytial Virus by PCR: NEGATIVE
SARS Coronavirus 2 by RT PCR: NEGATIVE

## 2023-07-07 MED ORDER — DOXYCYCLINE HYCLATE 100 MG PO CAPS: 100.0000 mg | ORAL_CAPSULE | Freq: Two times a day (BID) | ORAL | 0 refills | Status: AC

## 2023-07-07 MED ORDER — ALBUTEROL SULFATE (2.5 MG/3ML) 0.083% IN NEBU: 2.5000 mg | INHALATION_SOLUTION | Freq: Four times a day (QID) | RESPIRATORY_TRACT | 12 refills | Status: DC | PRN

## 2023-07-07 MED ORDER — IPRATROPIUM-ALBUTEROL 0.5-2.5 (3) MG/3ML IN SOLN
3.0000 mL | Freq: Once | RESPIRATORY_TRACT | Status: AC
  Administered 2023-07-07: 3 mL via RESPIRATORY_TRACT
  Filled 2023-07-07: qty 3

## 2023-07-07 MED ORDER — SODIUM CHLORIDE 0.9 % IV SOLN
1.0000 g | Freq: Once | INTRAVENOUS | Status: AC
  Administered 2023-07-07: 1 g via INTRAVENOUS
  Filled 2023-07-07: qty 10

## 2023-07-07 MED ORDER — FENTANYL CITRATE PF 50 MCG/ML IJ SOSY
25.0000 ug | PREFILLED_SYRINGE | Freq: Once | INTRAMUSCULAR | Status: AC
  Administered 2023-07-07: 25 ug via INTRAVENOUS
  Filled 2023-07-07: qty 1

## 2023-07-07 MED ORDER — AMOXICILLIN-POT CLAVULANATE 875-125 MG PO TABS: 1.0000 | ORAL_TABLET | Freq: Two times a day (BID) | ORAL | 0 refills | Status: AC

## 2023-07-07 MED ORDER — METHYLPREDNISOLONE SODIUM SUCC 125 MG IJ SOLR
125.0000 mg | Freq: Once | INTRAMUSCULAR | Status: AC
  Administered 2023-07-07: 125 mg via INTRAVENOUS
  Filled 2023-07-07: qty 2

## 2023-07-07 MED ORDER — ALBUTEROL SULFATE HFA 108 (90 BASE) MCG/ACT IN AERS: 2.0000 | INHALATION_SPRAY | RESPIRATORY_TRACT | 0 refills | Status: AC | PRN

## 2023-07-07 MED ORDER — SODIUM CHLORIDE 0.9 % IV SOLN
500.0000 mg | Freq: Once | INTRAVENOUS | Status: AC
  Administered 2023-07-07: 500 mg via INTRAVENOUS
  Filled 2023-07-07: qty 5

## 2023-07-07 NOTE — ED Triage Notes (Signed)
Pt states SOB starting 2 days ago. Pt states hx of COPD, no oxygen use (o2 88% RA in triage.) Pt states he believes he has pneumonia. Pt states trying to take his inhalers at home with no relief, pt also states being out of his nebulizer treatments.

## 2023-07-07 NOTE — ED Provider Notes (Signed)
[place] EMERGENCY DEPARTMENT AT [facility] Provider Note   CSN: [id number] Arrival date & time: 07/07/23  1329     History Chief Complaint  Patient presents with   Shortness of Breath    Jerome Daniel is a 62 y.o. male.  Patient with past history significant for COPD, asthma, and tobacco use presents the emergency department concerns of shortness of breath.  Reports has been ongoing for the last 2 days without improvement.  Currently on any oxygen at home and was noted to be hypoxic in triage to 88%.  Patient suspects he likely has pneumonia as he is having some pain in his right lower chest.  Denies any improvement in symptoms at home with any over-the-counter or rescue medications.  Patient currently takes Trelegy for COPD.  Does report that he is currently out of any nebulized medications.  Denies chest pain, fevers, abdominal pain, nausea, vomiting, diarrhea, urinary symptoms.   Shortness of Breath      Home Medications Prior to Admission medications   Medication Sig Start Date End Date Taking? Authorizing Provider  albuterol (PROVENTIL) (2.5 MG/3ML) 0.083% nebulizer solution Take 3 mLs (2.5 mg total) by nebulization every 6 (six) hours as needed for wheezing or shortness of breath. 07/07/23  Yes Smitty Knudsen, PA-C  albuterol (VENTOLIN HFA) 108 (90 Base) MCG/ACT inhaler Inhale 2 puffs into the lungs every 4 (four) hours as needed for wheezing or shortness of breath. 07/07/23  Yes Maryanna Shape A, PA-C  amoxicillin-clavulanate (AUGMENTIN) 875-125 MG tablet Take 1 tablet by mouth 2 (two) times daily for 5 days. 07/07/23 07/12/23 Yes Smitty Knudsen, PA-C  doxycycline (VIBRAMYCIN) 100 MG capsule Take 1 capsule (100 mg total) by mouth 2 (two) times daily for 5 days. 07/07/23 07/12/23 Yes Smitty Knudsen, PA-C  gabapentin (NEURONTIN) 300 MG capsule Take 300 mg by mouth 3 (three) times daily as needed. 12/01/22   [provider]  tamsulosin (FLOMAX) 0.4 MG CAPS capsule  Take 1 capsule (0.4 mg total) by mouth daily. Patient not taking: Reported on 07/19/2022 06/28/22   Malen Gauze, MD  TRELEGY ELLIPTA 100-62.5-25 MCG/ACT AEPB Inhale 1 puff into the lungs daily. 06/18/22   [provider]      Allergies    Patient has no known allergies.    Review of Systems   Review of Systems  Respiratory:  Positive for shortness of breath.   All other systems reviewed and are negative.   Physical Exam Updated Vital Signs BP (!) 143/88 (BP Location: Right Arm)   Pulse (!) 108   Temp 99.8 F (37.7 C) (Oral)   Resp (!) 22   Ht 5\' 8"  (1.727 m)   Wt 108.9 kg   SpO2 100%   BMI 36.49 kg/m  Physical Exam Vitals and nursing note reviewed.  Constitutional:      General: He is not in acute distress.    Appearance: He is well-developed.  HENT:     Head: Normocephalic and atraumatic.  Eyes:     Conjunctiva/sclera: Conjunctivae normal.  Cardiovascular:     Rate and Rhythm: Normal rate and regular rhythm.     Heart sounds: No murmur heard. Pulmonary:     Effort: Pulmonary effort is normal. No respiratory distress.     Breath sounds: Examination of the right-upper field reveals wheezing. Examination of the left-upper field reveals wheezing. Examination of the right-middle field reveals wheezing. Examination of the left-middle field reveals wheezing. Examination of the right-lower field  reveals wheezing and rales. Examination of the left-lower field reveals wheezing. Wheezing, rhonchi and rales present. No decreased breath sounds.  Chest:     Chest wall: No mass or tenderness.  Abdominal:     Palpations: Abdomen is soft.     Tenderness: There is no abdominal tenderness.  Musculoskeletal:        General: No swelling.     Cervical back: Neck supple.  Skin:    General: Skin is warm and dry.     Capillary Refill: Capillary refill takes less than 2 seconds.  Neurological:     Mental Status: He is alert.  Psychiatric:        Mood and Affect: Mood  normal.     ED Results / Procedures / Treatments   Labs (all labs ordered are listed, but only abnormal results are displayed) Labs Reviewed  BASIC METABOLIC PANEL - Abnormal; Notable for the following components:      Result Value   Potassium 3.4 (*)    Glucose, Bld 116 (*)    Calcium 8.2 (*)    All other components within normal limits  CBC - Abnormal; Notable for the following components:   RBC 4.00 (*)    MCV 101.8 (*)    All other components within normal limits  RESP PANEL BY RT-PCR (RSV, FLU A&B, COVID)  [id number]    EKG EKG Interpretation Date/Time:  Friday July 07 2023 13:59:40 EDT Ventricular Rate:  101 PR Interval:  164 QRS Duration:  94 QT Interval:  330 QTC Calculation: 427 R Axis:   75  Text Interpretation: Sinus tachycardia Nonspecific ST abnormality Abnormal ECG When compared with ECG of 26-Jul-2022 14:45, Vent. rate has increased BY  37 BPM ST now depressed in Anterior leads No acute changes besides ST depression artifact in inferior leads Confirmed by Derwood Kaplan [phone]) on 07/07/2023 3:30:24 PM  Radiology DG Chest 2 View  Result Date: 07/07/2023 CLINICAL DATA:  Cough congestion EXAM: CHEST - 2 VIEW COMPARISON:  12/04/2017, chest CT 05/16/2022 FINDINGS: Right middle lobe consolidative opacity with patchy opacity at the left base. No pleural effusion. Normal cardiac size. No pneumothorax. IMPRESSION: Right middle lobe consolidative opacity and patchy left base opacity, suspicious for pneumonia, imaging follow-up to resolution is recommended Electronically Signed   By: Jasmine Pang M.D.   On: 07/07/2023 15:32    Procedures Procedures   Medications Ordered in ED Medications  ipratropium-albuterol (DUONEB) 0.5-2.5 (3) MG/3ML nebulizer solution 3 mL (3 mLs Nebulization Given 07/07/23 1510)  methylPREDNISolone sodium succinate (SOLU-MEDROL) 125 mg/2 mL injection 125 mg (125 mg Intravenous Given 07/07/23 1523)  fentaNYL (SUBLIMAZE) injection 25 mcg (25 mcg  Intravenous Given 07/07/23 1522)  cefTRIAXone (ROCEPHIN) 1 g in sodium chloride 0.9 % 100 mL IVPB (0 g Intravenous Stopped 07/07/23 1632)  azithromycin (ZITHROMAX) 500 mg in sodium chloride 0.9 % 250 mL IVPB (0 mg Intravenous Stopped 07/07/23 1717)    ED Course/ Medical Decision Making/ A&P                               Medical Decision Making Amount and/or Complexity of Data Reviewed Labs: ordered. Radiology: ordered.  Risk Prescription drug management.   This patient presents to the ED for concern of shortness of breath.  Differential diagnosis includes COPD exacerbation, pneumonia, MI, viral URI, CHF   Lab Tests:  I Ordered, and personally interpreted labs.  The pertinent results include: CBC and BMP unremarkable  with exception of mild hypokalemia 3.4, respiratory viral panel negative for COVID-19, influenza A and B, RSV   Imaging Studies ordered:  I ordered imaging studies including chest x-ray I independently visualized and interpreted imaging which showed pneumonia of the right middle lobe I agree with the radiologist interpretation   Medicines ordered and prescription drug management:  I ordered medication including DuoNeb, Solu-Medrol, Rocephin, Zithromax for COPD, pneumonia Reevaluation of the patient after these medicines showed that the patient improved I have reviewed the patients home medicines and have made adjustments as needed   Problem List / ED Course:  Patient presents to the emergency department with concerns of shortness of breath 2 days ago.  Prior history significant for COPD but not currently on any oxygen at home.  Patient concerned about possible pneumonia given he is having some pain in his right lower chest.  No prior history of PE.  Given the patient was initially hypoxic, placed on 2 L nasal cannula with workup initiated including CBC, BMP and respiratory viral panel.  Added on chest x-ray given abnormal lung sounds in right lung fields.  EKG  unremarkable. Basic labs without any acute concerns.  Patient is negative on viral panel.  Chest x-ray however is concerning for suspected pneumonia in the right middle lobe as well as opacity in the left lower lobe, suspicious for pneumonia.  Patient initially treated with Solu-Medrol, DuoNeb and fentanyl for shortness of breath and pain.  Given abnormal x-ray findings, initiated treatment with Rocephin and Zithromax for suspected community-acquired pneumonia. Patient tolerated interventions without any difficulties.  Will plan to continue medications at home with Augmentin and doxycycline for community-acquired pneumonia.  Also sent a refill of patient's albuterol to the pharmacy.  Discussed strict return precautions with patient.  Otherwise open the patient is currently unstable and requires admission or further workup.  Patient agreeable with current plan and verbalized understanding strict return precautions.  All questions answered prior to patient discharge.  Patient discharged home in stable condition.  Final Clinical Impression(s) / ED Diagnoses Final diagnoses:  Shortness of breath  Pneumonia of right middle lobe due to infectious organism    Rx / DC Orders ED Discharge Orders          Ordered    amoxicillin-clavulanate (AUGMENTIN) 875-125 MG tablet  2 times daily        07/07/23 1746    doxycycline (VIBRAMYCIN) 100 MG capsule  2 times daily        07/07/23 1746    For home use only DME Nebulizer machine        07/07/23 1746    albuterol (PROVENTIL) (2.5 MG/3ML) 0.083% nebulizer solution  Every 6 hours PRN        07/07/23 1746    albuterol (VENTOLIN HFA) 108 (90 Base) MCG/ACT inhaler  Every 4 hours PRN        07/07/23 1746              Smitty Knudsen, PA-C 07/07/23 1750    Derwood Kaplan, MD 07/12/23 1058

## 2023-07-07 NOTE — Discharge Instructions (Addendum)
You were seen in the ER today for shortness of breath.  Her labs are unremarkable and you are negative for COVID-19, RSV, influenza.  Chest x-ray shows pneumonia in the right lower lobe.  For this you are given a dose of Rocephin and azithromycin here in the emergency department.  I have sent you home with a prescription for Augmentin and doxycycline to continue taking for the next 5 days.  I have also sent a refill of your albuterol to your pharmacy.  If you have any worsening symptoms or new symptoms arise, return to the emergency department.  Otherwise, follow-up with your primary care provider.

## 2023-07-13 ENCOUNTER — Other Ambulatory Visit: Payer: Medicare HMO

## 2023-07-13 DIAGNOSIS — I10 Essential (primary) hypertension: Secondary | ICD-10-CM | POA: Diagnosis not present

## 2023-07-13 DIAGNOSIS — J449 Chronic obstructive pulmonary disease, unspecified: Secondary | ICD-10-CM | POA: Diagnosis not present

## 2023-07-13 DIAGNOSIS — Z23 Encounter for immunization: Secondary | ICD-10-CM | POA: Diagnosis not present

## 2023-07-13 DIAGNOSIS — M545 Low back pain, unspecified: Secondary | ICD-10-CM | POA: Diagnosis not present

## 2023-07-13 DIAGNOSIS — R2243 Localized swelling, mass and lump, lower limb, bilateral: Secondary | ICD-10-CM | POA: Diagnosis not present

## 2023-07-26 ENCOUNTER — Ambulatory Visit: Payer: Medicare HMO | Admitting: Urology

## 2023-08-06 ENCOUNTER — Emergency Department (HOSPITAL_COMMUNITY): Payer: Medicare HMO

## 2023-08-06 ENCOUNTER — Encounter (HOSPITAL_COMMUNITY): Payer: Self-pay

## 2023-08-06 ENCOUNTER — Emergency Department (HOSPITAL_COMMUNITY)
Admission: EM | Admit: 2023-08-06 | Discharge: 2023-08-06 | Disposition: A | Discharge status: Home / Self Care | Payer: Medicare HMO | Attending: Emergency Medicine | Admitting: Emergency Medicine

## 2023-08-06 ENCOUNTER — Other Ambulatory Visit: Payer: Self-pay

## 2023-08-06 DIAGNOSIS — J90 Pleural effusion, not elsewhere classified: Secondary | ICD-10-CM | POA: Diagnosis not present

## 2023-08-06 DIAGNOSIS — C3491 Malignant neoplasm of unspecified part of right bronchus or lung: Secondary | ICD-10-CM | POA: Diagnosis not present

## 2023-08-06 DIAGNOSIS — J181 Lobar pneumonia, unspecified organism: Secondary | ICD-10-CM | POA: Diagnosis not present

## 2023-08-06 DIAGNOSIS — R918 Other nonspecific abnormal finding of lung field: Secondary | ICD-10-CM | POA: Diagnosis not present

## 2023-08-06 DIAGNOSIS — R079 Chest pain, unspecified: Secondary | ICD-10-CM | POA: Diagnosis not present

## 2023-08-06 DIAGNOSIS — J189 Pneumonia, unspecified organism: Secondary | ICD-10-CM

## 2023-08-06 DIAGNOSIS — R590 Localized enlarged lymph nodes: Secondary | ICD-10-CM | POA: Diagnosis not present

## 2023-08-06 DIAGNOSIS — Z87891 Personal history of nicotine dependence: Secondary | ICD-10-CM | POA: Diagnosis not present

## 2023-08-06 DIAGNOSIS — E871 Hypo-osmolality and hyponatremia: Secondary | ICD-10-CM | POA: Insufficient documentation

## 2023-08-06 DIAGNOSIS — R911 Solitary pulmonary nodule: Secondary | ICD-10-CM | POA: Diagnosis not present

## 2023-08-06 DIAGNOSIS — C3490 Malignant neoplasm of unspecified part of unspecified bronchus or lung: Secondary | ICD-10-CM | POA: Diagnosis not present

## 2023-08-06 DIAGNOSIS — J168 Pneumonia due to other specified infectious organisms: Secondary | ICD-10-CM | POA: Diagnosis not present

## 2023-08-06 DIAGNOSIS — R059 Cough, unspecified: Secondary | ICD-10-CM | POA: Diagnosis not present

## 2023-08-06 DIAGNOSIS — R0781 Pleurodynia: Secondary | ICD-10-CM | POA: Diagnosis present

## 2023-08-06 LAB — CBC WITH DIFFERENTIAL/PLATELET
Abs Immature Granulocytes: 0.03 10*3/uL (ref 0.00–0.07)
Basophils Absolute: 0 10*3/uL (ref 0.0–0.1)
Basophils Relative: 0 %
Eosinophils Absolute: 0.2 10*3/uL (ref 0.0–0.5)
Eosinophils Relative: 3 %
HCT: 45.9 % (ref 39.0–52.0)
Hemoglobin: 15 g/dL (ref 13.0–17.0)
Immature Granulocytes: 0 %
Lymphocytes Relative: 7 %
Lymphs Abs: 0.5 10*3/uL — ABNORMAL LOW (ref 0.7–4.0)
MCH: 33.6 pg (ref 26.0–34.0)
MCHC: 32.7 g/dL (ref 30.0–36.0)
MCV: 102.7 fL — ABNORMAL HIGH (ref 80.0–100.0)
Monocytes Absolute: 0.5 10*3/uL (ref 0.1–1.0)
Monocytes Relative: 7 %
Neutro Abs: 6.5 10*3/uL (ref 1.7–7.7)
Neutrophils Relative %: 83 %
Platelets: 168 10*3/uL (ref 150–400)
RBC: 4.47 MIL/uL (ref 4.22–5.81)
RDW: 14.1 % (ref 11.5–15.5)
WBC: 7.7 10*3/uL (ref 4.0–10.5)
nRBC: 0 % (ref 0.0–0.2)

## 2023-08-06 LAB — BASIC METABOLIC PANEL
Anion gap: 7 (ref 5–15)
BUN: 8 mg/dL (ref 8–23)
CO2: 26 mmol/L (ref 22–32)
Calcium: 8.2 mg/dL — ABNORMAL LOW (ref 8.9–10.3)
Chloride: 98 mmol/L (ref 98–111)
Creatinine, Ser: 0.7 mg/dL (ref 0.61–1.24)
GFR, Estimated: >60 mL/min (ref >60)
Glucose, Bld: 95 mg/dL (ref 70–99)
Potassium: 3.9 mmol/L (ref 3.5–5.1)
Sodium: 131 mmol/L — ABNORMAL LOW (ref 135–145)

## 2023-08-06 MED ORDER — NAPROXEN 250 MG PO TABS
500.0000 mg | ORAL_TABLET | Freq: Once | ORAL | Status: AC
  Administered 2023-08-06: 500 mg via ORAL
  Filled 2023-08-06: qty 2

## 2023-08-06 MED ORDER — NAPROXEN 500 MG PO TABS: 500.0000 mg | ORAL_TABLET | Freq: Two times a day (BID) | ORAL | 0 refills | Status: DC

## 2023-08-06 MED ORDER — AMOXICILLIN-POT CLAVULANATE 875-125 MG PO TABS: 1.0000 | ORAL_TABLET | Freq: Two times a day (BID) | ORAL | 0 refills | Status: DC

## 2023-08-06 MED ORDER — SODIUM CHLORIDE 0.9 % IV SOLN
1.0000 g | Freq: Once | INTRAVENOUS | Status: AC
  Administered 2023-08-06: 1 g via INTRAVENOUS
  Filled 2023-08-06: qty 10

## 2023-08-06 MED ORDER — IOHEXOL 350 MG/ML SOLN
75.0000 mL | Freq: Once | INTRAVENOUS | Status: AC | PRN
  Administered 2023-08-06: 75 mL via INTRAVENOUS

## 2023-08-06 MED ORDER — AZITHROMYCIN 250 MG PO TABS: 250.0000 mg | ORAL_TABLET | Freq: Every day | ORAL | 0 refills | Status: DC

## 2023-08-06 MED ORDER — AZITHROMYCIN 250 MG PO TABS
500.0000 mg | ORAL_TABLET | Freq: Once | ORAL | Status: AC
  Administered 2023-08-06: 500 mg via ORAL
  Filled 2023-08-06: qty 2

## 2023-08-06 MED ORDER — KETOROLAC TROMETHAMINE 30 MG/ML IJ SOLN
30.0000 mg | Freq: Once | INTRAMUSCULAR | Status: AC
  Administered 2023-08-06: 30 mg via INTRAVENOUS
  Filled 2023-08-06: qty 1

## 2023-08-06 NOTE — Discharge Instructions (Signed)
Your testing today shows that you have pneumonia in the right lung but it also shows that you have a likely lung cancer on the right side.  This will need to be seen by the cancer doctors and I have sent a message to both the lung doctors and the cancer doctors to let them know.  I would strongly recommend that you call the phone number above tomorrow morning for the next available appointment.  Take the 2 medications that I prescribed including Augmentin and Zithromax  Please take Augmentin, 1 tablet twice a day for the next 7 days, this is used to treat infections, it treats a variety of infections including animal bites, bacterial infections, and some gastrointestinal infections.   It is related to amoxicillin so do not take this medication if you are allergic to amoxicillin or penicillin.  effects of medications such as antibiotics include diarrhea which may occur as well as potentially inactivating birth control so if you are using a birth control pill please use an alternative form of birth control for the next 2 weeks.  There is occasions where this antibiotic does not work so if you are not improving within 48 hours you will need to be reevaluated immediately by your doctor or in the emergency department if your symptoms are worsening  Thank you for allowing [place] to treat you in the emergency department today.  After reviewing your examination and potential testing that was done it appears that you are safe to go home.  I would like for you to follow-up with your doctor within the next several days, have them obtain your records and follow-up with them to review all potential tests and results from your visit.  If you should develop severe or worsening symptoms return to the emergency department immediately

## 2023-08-06 NOTE — ED Provider Notes (Signed)
[place] EMERGENCY DEPARTMENT AT [facility] Provider Note   CSN: [id number] Arrival date & time: 08/06/23  1420     History  Chief Complaint  Patient presents with   rib pain     Jerome Daniel is a 62 y.o. male.  HPI   Recent pna a month ago - took antibiotitcs and got better Now with R sided CP with breathing for 2 days - ongoing cough, no phlegm. Stopped smoking in 15 year s ago.  Has no swelling in the legs.  No fevers, no chills, no n/v.  Taks trelegy - and albuterol when he needs it.  Home Medications Prior to Admission medications   Medication Sig Start Date End Date Taking? Authorizing Provider  albuterol (VENTOLIN HFA) 108 (90 Base) MCG/ACT inhaler Inhale 2 puffs into the lungs every 4 (four) hours as needed for wheezing or shortness of breath. 07/07/23  Yes Maryanna Shape A, PA-C  amoxicillin-clavulanate (AUGMENTIN) 875-125 MG tablet Take 1 tablet by mouth every 12 (twelve) hours. 08/06/23  Yes Eber Hong, MD  azithromycin (ZITHROMAX Z-PAK) 250 MG tablet Take 1 tablet (250 mg total) by mouth daily. 500mg  PO day 1, then 250mg  PO days 205 08/06/23  Yes Eber Hong, MD  gabapentin (NEURONTIN) 300 MG capsule Take 300 mg by mouth 3 (three) times daily as needed (for pain). 12/01/22  Yes [provider]  naproxen (NAPROSYN) 500 MG tablet Take 1 tablet (500 mg total) by mouth 2 (two) times daily with a meal. 08/06/23  Yes Eber Hong, MD  TRELEGY ELLIPTA 100-62.5-25 MCG/ACT AEPB Inhale 1 puff into the lungs daily. 06/18/22  Yes [provider]      Allergies    Patient has no known allergies.    Review of Systems   Review of Systems  All other systems reviewed and are negative.   Physical Exam Updated Vital Signs BP 118/79   Pulse 63   Temp 99.3 F (37.4 C) (Oral)   Resp 16   Wt 108 kg   SpO2 100%   BMI 36.20 kg/m  Physical Exam Vitals and nursing note reviewed.  Constitutional:      General: He is not in acute distress.     Appearance: He is well-developed. He is not toxic-appearing.  HENT:     Head: Normocephalic and atraumatic.     Mouth/Throat:     Pharynx: No oropharyngeal exudate.  Eyes:     General: No scleral icterus.       Right eye: No discharge.        Left eye: No discharge.     Conjunctiva/sclera: Conjunctivae normal.     Pupils: Pupils are equal, round, and reactive to light.  Neck:     Thyroid: No thyromegaly.     Vascular: No JVD.  Cardiovascular:     Rate and Rhythm: Normal rate and regular rhythm.     Heart sounds: Normal heart sounds. No murmur heard.    No friction rub. No gallop.  Pulmonary:     Effort: Pulmonary effort is normal. No respiratory distress.     Breath sounds: Wheezing present. No rales.  Chest:     Chest wall: No tenderness.  Abdominal:     General: Bowel sounds are normal. There is no distension.     Palpations: Abdomen is soft. There is no mass.     Tenderness: There is no abdominal tenderness.  Musculoskeletal:        General: No tenderness. Normal range  of motion.     Cervical back: Normal range of motion and neck supple.     Right lower leg: No edema.     Left lower leg: No edema.  Lymphadenopathy:     Cervical: No cervical adenopathy.  Skin:    General: Skin is warm and dry.     Findings: No erythema or rash.  Neurological:     Mental Status: He is alert.     Coordination: Coordination normal.  Psychiatric:        Behavior: Behavior normal.     ED Results / Procedures / Treatments   Labs (all labs ordered are listed, but only abnormal results are displayed) Labs Reviewed  CBC WITH DIFFERENTIAL/PLATELET - Abnormal; Notable for the following components:      Result Value   MCV 102.7 (*)    Lymphs Abs 0.5 (*)    All other components within normal limits  BASIC METABOLIC PANEL - Abnormal; Notable for the following components:   Sodium 131 (*)    Calcium 8.2 (*)    All other components within normal limits    EKG None  Radiology CT Angio  Chest PE W and/or Wo Contrast  Addendum Date: 08/06/2023   ADDENDUM REPORT: 08/06/2023 17:54 ADDENDUM: No evidence of pulmonary embolus. Electronically Signed   By: Allegra Lai M.D.   On: 08/06/2023 17:54   Result Date: 08/06/2023 CLINICAL DATA:  Right chest wall pain; * Tracking Code: BO * EXAM: CT ANGIOGRAPHY CHEST WITH CONTRAST TECHNIQUE: Multidetector CT imaging of the chest was performed using the standard protocol during bolus administration of intravenous contrast. Multiplanar CT image reconstructions and MIPs were obtained to evaluate the vascular anatomy. RADIATION DOSE REDUCTION: This exam was performed according to the departmental dose-optimization program which includes automated exposure control, adjustment of the mA and/or kV according to patient size and/or use of iterative reconstruction technique. CONTRAST:  75mL OMNIPAQUE IOHEXOL 350 MG/ML SOLN COMPARISON:  Chest CT dated Feb 20, 2017 FINDINGS: Cardiovascular: Normal heart size. No No pericardial effusion. Normal caliber thoracic aorta with significant atherosclerotic disease. Mediastinum/Nodes: Esophagus thyroid are unremarkable. Enlarged subcarinal and right hilar lymph nodes. Reference right subcarinal lymph node measuring 16 mm in short axis on series 4, image 48 and subcarinal lymph node measuring 10 mm in short axis on image 50. Lungs/Pleura: Occlusion of the right middle lobe bronchi. Right middle lobe consolidation with extension into the adjacent right upper lobe. Irregular peribronchovascular solid nodule of the right upper lobe measuring 2.2 x 1.4 cm on series 6, image 36. Irregular subpleural nodular opacity of the right upper lobe measuring 14 x 8 mm on series 6, image 56, similar to prior, although cystic space is no longer present. Additional irregular linear opacities are seen in the right lower lobe which are similar or decreased when compared with the prior and likely due to scarring new left lower lobe linear opacity  which is likely due to scarring or atelectasis. Trace right pleural effusion adjacent to the right middle lobe consolidation. Upper Abdomen: No acute abnormality. Musculoskeletal: No chest wall abnormality. No acute or significant osseous findings. Review of the MIP images confirms the above findings. IMPRESSION: 1. Right middle lobe consolidation with associated occlusion of the right middle lobe bronchus and local extension into the right upper lobe, concerning for underlying malignancy. Recommend tissue sampling for further evaluation. 2. Enlarged subcarinal and right hilar lymph nodes, concerning for metastatic disease. 3. Irregular peribronchovascular solid nodule of the right upper lobe, concerning for  metachronous primary or metastatic disease. 4. Trace right pleural effusion adjacent to the right middle lobe consolidation. 5. Additional bilateral irregular linear and opacities are seen which are likely due to scarring or atelectasis. 6. Aortic Atherosclerosis (ICD10-I70.0). Electronically Signed: By: Allegra Lai M.D. On: 08/06/2023 17:34   DG Chest 2 View  Result Date: 08/06/2023 CLINICAL DATA:  Coughing and right-sided chest pain. EXAM: CHEST - 2 VIEW COMPARISON:  07/07/2023. FINDINGS: Redemonstration of homogeneous opacification of the middle lobe, suggesting combination of atelectasis and/or pneumonia. There is slight interval decrease in the overall size of the opacity when compared to the prior exam; however, considering persistence of the middle lobe collapse, further evaluation with contrast-enhanced chest CT scan versus bronchoscopy is recommended, to exclude centrally obstructing mass. Bilateral lung fields are otherwise clear. Bilateral costophrenic angles are clear. Normal cardio-mediastinal silhouette. No acute osseous abnormalities. The soft tissues are within normal limits. IMPRESSION: 1. Persistent right middle lobe collapse. Further evaluation with contrast-enhanced chest CT scan  versus bronchoscopy is recommended, to exclude centrally obstructing mass. 2. Otherwise clear lungs. Electronically Signed   By: Jules Schick M.D.   On: 08/06/2023 16:42    Procedures Procedures    Medications Ordered in ED Medications  cefTRIAXone (ROCEPHIN) 1 g in sodium chloride 0.9 % 100 mL IVPB (has no administration in time range)  azithromycin (ZITHROMAX) tablet 500 mg (has no administration in time range)  ketorolac (TORADOL) 30 MG/ML injection 30 mg (has no administration in time range)  naproxen (NAPROSYN) tablet 500 mg (500 mg Oral Given 08/06/23 1625)  iohexol (OMNIPAQUE) 350 MG/ML injection 75 mL (75 mLs Intravenous Contrast Given 08/06/23 1652)    ED Course/ Medical Decision Making/ A&P                                 Medical Decision Making Amount and/or Complexity of Data Reviewed Labs: ordered. Radiology: ordered.  Risk Prescription drug management.   This patient presents to the ED for concern of R sided pleurisy differential diagnosis includes pna, ptx, pleurisy, less likel iyPE - sat of 99% , HR is 74.    Additional history obtained:  Additional history obtained from EMR External records from outside source obtained and reviewed including prior records and cxr.   Lab Tests:  I Ordered, and personally interpreted labs.  The pertinent results include: Unremarkable CBC and metabolic panel, no leukocytosis, mild hyponatremia   Imaging Studies ordered:  I ordered imaging studies including chest x-ray and CT scan of the chest I independently visualized and interpreted imaging which showed infiltrate of the right lung as well as what appears to be multiple nodular cancerous spots I agree with the radiologist interpretation   Medicines ordered and prescription drug management:  I ordered medication including Rocephin and Zithromax for infection, Toradol for pain Reevaluation of the patient after these medicines showed that the patient improved I  have reviewed the patients home medicines and have made adjustments as needed   Problem List / ED Course:  The patient was informed of what appears to be a new onset lung cancer, thankfully he does not have any signs of sepsis and will be treated for the pneumonia as an outpatient.  Ambulatory referral to hematology oncology was given and the patient was given the phone numbers, he expressed his understanding about the findings on the CT scan and the need for close follow-up.  I have also messaged the cancer  specialist and the pulmonologist to let them know   Social Determinants of Health:  Prior smoker           Final Clinical Impression(s) / ED Diagnoses Final diagnoses:  Pneumonia of right lower lobe due to infectious organism  Malignant neoplasm of right lung, unspecified part of lung (HCC)    Rx / DC Orders ED Discharge Orders          Ordered    amoxicillin-clavulanate (AUGMENTIN) 875-125 MG tablet  Every 12 hours        08/06/23 1810    azithromycin (ZITHROMAX Z-PAK) 250 MG tablet  Daily        08/06/23 1810    Ambulatory referral to Hematology / Oncology        08/06/23 1810    naproxen (NAPROSYN) 500 MG tablet  2 times daily with meals        08/06/23 1813              Eber Hong, MD 08/06/23 1813

## 2023-08-06 NOTE — ED Triage Notes (Addendum)
C/o pain under right rib cage x2 days. Pain worse with coughing.   Denies sob. Ambulatory to triage.  Hx copd.  Recently treated for PNA on 9/20.
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
## 2023-08-10 ENCOUNTER — Encounter: Payer: Self-pay | Admitting: Hematology

## 2023-08-10 ENCOUNTER — Inpatient Hospital Stay: Payer: Medicare HMO | Attending: Hematology | Admitting: Hematology

## 2023-08-10 VITALS — BP 107/65 | HR 82 | Temp 98.0°F | Resp 18 | Ht 70.0 in | Wt 216.3 lb

## 2023-08-10 DIAGNOSIS — C3490 Malignant neoplasm of unspecified part of unspecified bronchus or lung: Secondary | ICD-10-CM

## 2023-08-10 DIAGNOSIS — C61 Malignant neoplasm of prostate: Secondary | ICD-10-CM | POA: Diagnosis not present

## 2023-08-10 DIAGNOSIS — Z87891 Personal history of nicotine dependence: Secondary | ICD-10-CM | POA: Insufficient documentation

## 2023-08-10 DIAGNOSIS — R918 Other nonspecific abnormal finding of lung field: Secondary | ICD-10-CM | POA: Insufficient documentation

## 2023-08-10 DIAGNOSIS — J449 Chronic obstructive pulmonary disease, unspecified: Secondary | ICD-10-CM | POA: Diagnosis not present

## 2023-08-10 DIAGNOSIS — Z139 Encounter for screening, unspecified: Secondary | ICD-10-CM

## 2023-08-10 NOTE — Patient Instructions (Addendum)
[facility]  Discharge Instructions  You were seen and examined today by Dr. Ellin Saba. Dr. Ellin Saba is a medical oncologist, meaning that he specializes in the treatment of cancer diagnoses. Dr. Ellin Saba discussed your past medical history, family history of cancers, and the events that led to you being here today.  You were referred to Dr. Ellin Saba due to an abnormal CT scan which revealed an area of concern in your right lung as well as enlarged lymph nodes. This is highly concerning for lung cancer.  Dr. Ellin Saba has recommended a PET scan and a brain MRI to accurately stage the cancer.  You will be referred to a pulmonologist for a biopsy.  Follow-up as scheduled.  Thank you for choosing [facility] to provide your oncology and hematology care.   To afford each patient quality time with our provider, please arrive at least 15 minutes before your scheduled appointment time. You may need to reschedule your appointment if you arrive late (10 or more minutes). Arriving late affects you and other patients whose appointments are after yours.  Also, if you miss three or more appointments without notifying the office, you may be dismissed from the clinic at the provider's discretion.    Again, thank you for choosing [facility].  Our hope is that these requests will decrease the amount of time that you wait before being seen by our physicians.   If you have a lab appointment with the Cancer Center - please note that after April 8th, all labs will be drawn in the cancer center.  You do not have to check in or register with the main entrance as you have in the past but will complete your check-in at the cancer center.            _____________________________________________________________  Should you have questions after your visit to [facility], please contact our office at [phone] and follow the  prompts.  Our office hours are 8:00 a.m. to 4:30 p.m. Monday - Thursday and 8:00 a.m. to 2:30 p.m. Friday.  Please note that voicemails left after 4:00 p.m. may not be returned until the following business day.  We are closed weekends and all major holidays.  You do have access to a nurse 24-7, just call the main number to the clinic [phone] and do not press any options, hold on the line and a nurse will answer the phone.    For prescription refill requests, have your pharmacy contact our office and allow 72 hours.    Masks are no longer required in the cancer centers. If you would like for your care team to wear a mask while they are taking care of you, please let them know. You may have one support person who is at least 61 years old accompany you for your appointments.

## 2023-08-17 ENCOUNTER — Inpatient Hospital Stay: Payer: Medicare HMO | Admitting: Licensed Clinical Social Worker

## 2023-08-17 ENCOUNTER — Encounter (HOSPITAL_COMMUNITY)
Admission: RE | Admit: 2023-08-17 | Discharge: 2023-08-17 | Disposition: A | Discharge status: Home / Self Care | Payer: Medicare HMO | Source: Ambulatory Visit | Attending: Hematology | Admitting: Hematology

## 2023-08-17 DIAGNOSIS — C3490 Malignant neoplasm of unspecified part of unspecified bronchus or lung: Secondary | ICD-10-CM

## 2023-08-17 DIAGNOSIS — J984 Other disorders of lung: Secondary | ICD-10-CM | POA: Diagnosis not present

## 2023-08-17 MED ORDER — FLUDEOXYGLUCOSE F - 18 (FDG) INJECTION
11.3400 | Freq: Once | INTRAVENOUS | Status: AC | PRN
Start: 2023-08-17 — End: 2023-08-17
  Administered 2023-08-17: 11.34 via INTRAVENOUS

## 2023-08-17 NOTE — Progress Notes (Signed)
[facility] Clinical Social Work  Initial Assessment   Jerome Daniel is a 62 y.o. year old male contacted by phone. Clinical Social Work was referred by medical provider for assessment of psychosocial needs.   SDOH (Social Determinants of Health) assessments performed: Yes SDOH Interventions    Flowsheet Row Clinical Support from 08[facility]17/2023 in [facility] Visit from 08/10/2023 in [facility]  SDOH Interventions    Food Insecurity Interventions -- Patient Declined  Housing Interventions -- Other (Comment)  [referral]  Transportation Interventions -- Patient Declined  Utilities Interventions -- Patient Declined  Financial Strain Interventions Artist --       SDOH Screenings   Food Insecurity: No Food Insecurity (08/10/2023)  Housing: Medium Risk (08/10/2023)  Transportation Needs: No Transportation Needs (08/10/2023)  Utilities: Not At Risk (08/10/2023)  Depression (PHQ2-9): Low Risk  (08/10/2023)  Financial Resource Strain: High Risk (08/17/2023)  Tobacco Use: Medium Risk (08/10/2023)     Distress Screen completed: Yes    12/20/2021    1:21 PM  ONCBCN DISTRESS SCREENING  Screening Type Initial Screening  Distress experienced in past week (1-10) 3  Emotional problem type Adjusting to illness  Information Concerns Type Lack of info about diagnosis;Lack of info about treatment      Family/Social Information:  Housing Arrangement: patient lives with a friend.  Pt assists his friend who has a number of health issues, but does not believe he will be able to remain there long term.   Family members/support persons in your life? Pt has only his aunt residing locally who can offer only limited assistance.  The address on pt's chart is his aunt's home.   Transportation concerns: yes  Employment: Disabled pt has received disability for 10 years due to COPD and multiple back surgeries.  Income source: Lawyer concerns: Yes, current concerns Type of concern: Utilities, Rent/ mortgage, Transportation, and Medical bills Food access concerns: no Religious or spiritual practice: Not known Services Currently in place:  none  Coping/ Adjustment to diagnosis: Patient understands treatment plan and what happens next? Pt is still undergoing diagnostics, treatment is not yet determined. Concerns about diagnosis and/or treatment: Overwhelmed by information, How I will pay for the services I need, and How will I care for myself Patient reported stressors: Finances, Transportation, Anxiety/ nervousness, and Adjusting to my illness Hopes and/or priorities: Pt's priority is to complete diagnostics and start treatment. Patient enjoys  not addressed Current coping skills/ strengths: Motivation for treatment/growth     SUMMARY: Current SDOH Barriers:  Financial constraints related to fixed income, Limited social support, Transportation, Housing barriers, and Literacy concerns  Clinical Social Work Clinical Goal(s):  Explore community resource options for unmet needs related to:  Housing , Transportation, and Financial Strain   Interventions: Discussed common feeling and emotions when being diagnosed with cancer, and the importance of support during treatment Informed patient of the support team roles and support services at [facility] Provided CSW contact information and encouraged patient to call with any questions or concerns Pt states he is easily overwhelmed and will need to take care of issues one at a time.  CSW suggested pt go to DSS to apply for Medicaid and SSI for additional income given his SSDI is minimal.  Pt also instructed to check into section 8 housing at that time.  CSW to follow up with pt in two weeks.  At that time pt will be referred to Marijean Niemann if  treatment has been determined and applied for the [employer] if appropriate.     Follow Up Plan: CSW will follow-up with  patient by phone  Patient verbalizes understanding of plan: Yes    Jerome Moulds, LCSW Clinical Social Worker [facility]

## 2023-08-18 DIAGNOSIS — C801 Malignant (primary) neoplasm, unspecified: Secondary | ICD-10-CM

## 2023-08-18 HISTORY — DX: Malignant (primary) neoplasm, unspecified: C80.1

## 2023-08-23 ENCOUNTER — Ambulatory Visit (HOSPITAL_COMMUNITY): Payer: Medicare HMO
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
## 2023-08-31 ENCOUNTER — Inpatient Hospital Stay: Payer: Medicare HMO | Admitting: Hematology

## 2023-09-01 ENCOUNTER — Encounter: Payer: Self-pay | Admitting: Emergency Medicine

## 2023-09-01 ENCOUNTER — Ambulatory Visit (INDEPENDENT_AMBULATORY_CARE_PROVIDER_SITE_OTHER): Payer: Medicare HMO | Admitting: Emergency Medicine

## 2023-09-01 VITALS — BP 133/96 | HR 68 | Temp 98.2°F | Ht 68.0 in | Wt 217.6 lb

## 2023-09-01 DIAGNOSIS — R918 Other nonspecific abnormal finding of lung field: Secondary | ICD-10-CM | POA: Diagnosis not present

## 2023-09-01 DIAGNOSIS — J449 Chronic obstructive pulmonary disease, unspecified: Secondary | ICD-10-CM | POA: Diagnosis not present

## 2023-09-01 NOTE — Progress Notes (Signed)
Subjective:    Patient ID: Jerome Daniel, male    DOB: July 05, 1961, 62 y.o.   MRN: [id number]  HPI  A 62 year old individual with a history of tobacco use (20+ pk-yrs), prior stage 2C prostate cancer, COPD, and substance abuse presented with right-sided chest wall pain. He reported a history of recurrent pneumonia and described the current pain as similar to previous pneumonia episodes. The pain was localized to the right side of the chest, and was associated with green sputum production, but no hemoptysis. The individual reported a slight discomfort on deep inhalation, but overall, the symptoms had improved.  A CT angiogram revealed a right middle lobe consolidation associated with occlusion of the right middle lobe bronchus and local extension into the right upper lobe, suggestive of malignancy. Enlarged subcarotid artery and right hilar lymphadenopathy were also noted. A subsequent PET scan confirmed the presence of a hypermetabolic mass lesion in the right middle lobe, with no hypermetabolic mediastinal nodes.  The individual had a history of smoking approximately a pack and a half to two packs of cigarettes a day for five to six years, but had quit smoking years ago upon diagnosis of COPD. 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 The individual was on Trelegy for COPD management and used a rescue inhaler one to two times a day as needed.  RADIOLOGY CT angiogram: Right middle lobe consolidation, occlusion of the right middle lobe bronchus, local extension into the right upper lobe consistent with malignancy, enlarged subcarinal lymph node, right hilar lymphadenopathy (08/06/2023) PET scan: Hypermetabolic right middle lobe mass lesion, no hypermetabolic mediastinal nodes (08/17/2023)  Review of Systems As per HPI  Past Medical History:  Diagnosis Date   Cancer (HCC)    Prostate   Chronic dental pain    COPD (chronic  obstructive pulmonary disease) (HCC)    PNA (pneumonia)    Pneumonia    Polysubstance abuse (HCC)    etoh, rx narcotics ("buy them off the street"), cocaine     Family History  Problem Relation Age of Onset   Diabetes Mother    Colon cancer Neg Hx      Social History   Socioeconomic History   Marital status: Divorced    Spouse name: Not on file   Number of children: Not on file   Years of education: Not on file   Highest education level: Not on file  Occupational History   Not on file  Tobacco Use   Smoking status: Former    Current packs/day: 0.00    Types: Cigarettes    Quit date: 12/21/2015    Years since quitting: 7.7   Smokeless tobacco: Former    Types: Snuff  Vaping Use   Vaping status: Never Used  Substance and Sexual Activity   Alcohol use: Yes    Comment: 6 pack of beer a day.   Drug use: Not Currently    Types: Cocaine, Other-see comments    Comment: last used illicit drugs more than 10 years ago. Will take a pain pill here and there.   Sexual activity: Never  Other Topics Concern   Not on file  Social History Narrative   ** Merged History Encounter **       Social Determinants of Health   Financial Resource Strain: High Risk (08/17/2023)   Overall Financial Resource Strain (CARDIA)    Difficulty of Paying Living Expenses: Very hard  Food Insecurity: No Food  Insecurity (08/10/2023)   Hunger Vital Sign    Worried About Running Out of Food in the Last Year: Never true    Ran Out of Food in the Last Year: Never true  Transportation Needs: No Transportation Needs (08/10/2023)   PRAPARE - Administrator, Civil Service (Medical): No    Lack of Transportation (Non-Medical): No  Physical Activity: Not on file  Stress: Not on file  Social Connections: Not on file  Intimate Partner Violence: Not At Risk (08/10/2023)   Humiliation, Afraid, Rape, and Kick questionnaire    Fear of Current or Ex-Partner: No    Emotionally Abused: No     Physically Abused: No    Sexually Abused: No     No Known Allergies   Outpatient Medications Prior to Visit  Medication Sig Dispense Refill   albuterol (VENTOLIN HFA) 108 (90 Base) MCG/ACT inhaler Inhale 2 puffs into the lungs every 4 (four) hours as needed for wheezing or shortness of breath. (Patient not taking: Reported on 08/10/2023) 1 each 0   amoxicillin-clavulanate (AUGMENTIN) 875-125 MG tablet Take 1 tablet by mouth every 12 (twelve) hours. 14 tablet 0   naproxen (NAPROSYN) 500 MG tablet Take 1 tablet (500 mg total) by mouth 2 (two) times daily with a meal. 30 tablet 0   TRELEGY ELLIPTA 100-62.5-25 MCG/ACT AEPB Inhale 1 puff into the lungs daily.     gabapentin (NEURONTIN) 300 MG capsule Take 300 mg by mouth 3 (three) times daily as needed (for pain). (Patient not taking: Reported on 08/10/2023)     No facility-administered medications prior to visit.        Objective:   Physical Exam Vitals:   09/01/23 0913  BP: (!) 133/96  Pulse: 68  Temp: 98.2 F (36.8 C)  TempSrc: Oral  SpO2: 96%  Weight: 217 lb 9.6 oz (98.7 kg)  Height: 5\' 8"  (1.727 m)   Gen: Pleasant, well-nourished, in no distress,  normal affect  ENT: No lesions,  mouth clear, poor dentition, oropharynx clear, no postnasal drip  Neck: No JVD, no stridor  Lungs: No use of accessory muscles, no crackles or wheezing on normal respiration, no wheeze on forced expiration  Cardiovascular: RRR, heart sounds normal, no murmur or gallops, no peripheral edema  Musculoskeletal: No deformities, no cyanosis or clubbing  Neuro: alert, awake, non focal  Skin: Warm, no lesions or rash      Assessment & Plan:  Mass of middle lobe of right lung Suspected Lung Cancer Right-sided chest discomfort with CT angiogram showing right middle lobe consolidation associated with occlusion of the right middle lobe bronchus and local extension into the right upper lobe. PET scan showed hypermetabolic mass lesion with no  hypermetabolic mediastinal nodes. Discussed the need for bronchoscopy to confirm diagnosis. -Schedule bronchoscopy for the first week of December.   COPD (chronic obstructive pulmonary disease) (HCC) Chronic Obstructive Pulmonary Disease (COPD) Patient reports improvement in symptoms with Trelegy and occasional use of rescue inhaler (Albuterol). -Continue Trelegy and Albuterol as needed.   Levy Pupa, MD, PhD 09/01/2023, 10:10 AM [facility] [phone] or if no answer before 7:00PM call [phone] For any issues after 7:00PM please call eLink [phone]

## 2023-09-01 NOTE — Assessment & Plan Note (Signed)
Chronic Obstructive Pulmonary Disease (COPD) Patient reports improvement in symptoms with Trelegy and occasional use of rescue inhaler (Albuterol). -Continue Trelegy and Albuterol as needed.

## 2023-09-01 NOTE — H&P (View-Only) (Signed)
 Subjective:    Patient ID: Jerome Daniel, male    DOB: July 05, 1961, 62 y.o.   MRN: [id number]  HPI  A 62 year old individual with a history of tobacco use (20+ pk-yrs), prior stage 2C prostate cancer, COPD, and substance abuse presented with right-sided chest wall pain. He reported a history of recurrent pneumonia and described the current pain as similar to previous pneumonia episodes. The pain was localized to the right side of the chest, and was associated with green sputum production, but no hemoptysis. The individual reported a slight discomfort on deep inhalation, but overall, the symptoms had improved.  A CT angiogram revealed a right middle lobe consolidation associated with occlusion of the right middle lobe bronchus and local extension into the right upper lobe, suggestive of malignancy. Enlarged subcarotid artery and right hilar lymphadenopathy were also noted. A subsequent PET scan confirmed the presence of a hypermetabolic mass lesion in the right middle lobe, with no hypermetabolic mediastinal nodes.  The individual had a history of smoking approximately a pack and a half to two packs of cigarettes a day for five to six years, but had quit smoking years ago upon diagnosis of COPD. 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 The individual was on Trelegy for COPD management and used a rescue inhaler one to two times a day as needed.  RADIOLOGY CT angiogram: Right middle lobe consolidation, occlusion of the right middle lobe bronchus, local extension into the right upper lobe consistent with malignancy, enlarged subcarinal lymph node, right hilar lymphadenopathy (08/06/2023) PET scan: Hypermetabolic right middle lobe mass lesion, no hypermetabolic mediastinal nodes (08/17/2023)  Review of Systems As per HPI  Past Medical History:  Diagnosis Date   Cancer (HCC)    Prostate   Chronic dental pain    COPD (chronic  obstructive pulmonary disease) (HCC)    PNA (pneumonia)    Pneumonia    Polysubstance abuse (HCC)    etoh, rx narcotics ("buy them off the street"), cocaine     Family History  Problem Relation Age of Onset   Diabetes Mother    Colon cancer Neg Hx      Social History   Socioeconomic History   Marital status: Divorced    Spouse name: Not on file   Number of children: Not on file   Years of education: Not on file   Highest education level: Not on file  Occupational History   Not on file  Tobacco Use   Smoking status: Former    Current packs/day: 0.00    Types: Cigarettes    Quit date: 12/21/2015    Years since quitting: 7.7   Smokeless tobacco: Former    Types: Snuff  Vaping Use   Vaping status: Never Used  Substance and Sexual Activity   Alcohol use: Yes    Comment: 6 pack of beer a day.   Drug use: Not Currently    Types: Cocaine, Other-see comments    Comment: last used illicit drugs more than 10 years ago. Will take a pain pill here and there.   Sexual activity: Never  Other Topics Concern   Not on file  Social History Narrative   ** Merged History Encounter **       Social Determinants of Health   Financial Resource Strain: High Risk (08/17/2023)   Overall Financial Resource Strain (CARDIA)    Difficulty of Paying Living Expenses: Very hard  Food Insecurity: No Food  Insecurity (08/10/2023)   Hunger Vital Sign    Worried About Running Out of Food in the Last Year: Never true    Ran Out of Food in the Last Year: Never true  Transportation Needs: No Transportation Needs (08/10/2023)   PRAPARE - Administrator, Civil Service (Medical): No    Lack of Transportation (Non-Medical): No  Physical Activity: Not on file  Stress: Not on file  Social Connections: Not on file  Intimate Partner Violence: Not At Risk (08/10/2023)   Humiliation, Afraid, Rape, and Kick questionnaire    Fear of Current or Ex-Partner: No    Emotionally Abused: No     Physically Abused: No    Sexually Abused: No     No Known Allergies   Outpatient Medications Prior to Visit  Medication Sig Dispense Refill   albuterol (VENTOLIN HFA) 108 (90 Base) MCG/ACT inhaler Inhale 2 puffs into the lungs every 4 (four) hours as needed for wheezing or shortness of breath. (Patient not taking: Reported on 08/10/2023) 1 each 0   amoxicillin-clavulanate (AUGMENTIN) 875-125 MG tablet Take 1 tablet by mouth every 12 (twelve) hours. 14 tablet 0   naproxen (NAPROSYN) 500 MG tablet Take 1 tablet (500 mg total) by mouth 2 (two) times daily with a meal. 30 tablet 0   TRELEGY ELLIPTA 100-62.5-25 MCG/ACT AEPB Inhale 1 puff into the lungs daily.     gabapentin (NEURONTIN) 300 MG capsule Take 300 mg by mouth 3 (three) times daily as needed (for pain). (Patient not taking: Reported on 08/10/2023)     No facility-administered medications prior to visit.        Objective:   Physical Exam Vitals:   09/01/23 0913  BP: (!) 133/96  Pulse: 68  Temp: 98.2 F (36.8 C)  TempSrc: Oral  SpO2: 96%  Weight: 217 lb 9.6 oz (98.7 kg)  Height: 5\' 8"  (1.727 m)   Gen: Pleasant, well-nourished, in no distress,  normal affect  ENT: No lesions,  mouth clear, poor dentition, oropharynx clear, no postnasal drip  Neck: No JVD, no stridor  Lungs: No use of accessory muscles, no crackles or wheezing on normal respiration, no wheeze on forced expiration  Cardiovascular: RRR, heart sounds normal, no murmur or gallops, no peripheral edema  Musculoskeletal: No deformities, no cyanosis or clubbing  Neuro: alert, awake, non focal  Skin: Warm, no lesions or rash      Assessment & Plan:  Mass of middle lobe of right lung Suspected Lung Cancer Right-sided chest discomfort with CT angiogram showing right middle lobe consolidation associated with occlusion of the right middle lobe bronchus and local extension into the right upper lobe. PET scan showed hypermetabolic mass lesion with no  hypermetabolic mediastinal nodes. Discussed the need for bronchoscopy to confirm diagnosis. -Schedule bronchoscopy for the first week of December.   COPD (chronic obstructive pulmonary disease) (HCC) Chronic Obstructive Pulmonary Disease (COPD) Patient reports improvement in symptoms with Trelegy and occasional use of rescue inhaler (Albuterol). -Continue Trelegy and Albuterol as needed.   Levy Pupa, MD, PhD 09/01/2023, 10:10 AM [facility] [phone] or if no answer before 7:00PM call [phone] For any issues after 7:00PM please call eLink [phone]

## 2023-09-01 NOTE — Assessment & Plan Note (Signed)
Suspected Lung Cancer Right-sided chest discomfort with CT angiogram showing right middle lobe consolidation associated with occlusion of the right middle lobe bronchus and local extension into the right upper lobe. PET scan showed hypermetabolic mass lesion with no hypermetabolic mediastinal nodes. Discussed the need for bronchoscopy to confirm diagnosis. -Schedule bronchoscopy for the first week of December.

## 2023-09-01 NOTE — Patient Instructions (Addendum)
VISIT SUMMARY:  During today's visit, we discussed your right-sided chest pain and reviewed the results of your recent CT angiogram and PET scan. We also reviewed your COPD management and discussed the next steps for your suspected lung cancer.  YOUR PLAN:  -SUSPECTED LUNG CANCER: Suspected lung cancer means there is a possibility of cancer in your lungs based on imaging tests. We need to perform a bronchoscopy to confirm the diagnosis. This procedure is scheduled for the first week of December.  -CHRONIC OBSTRUCTIVE PULMONARY DISEASE (COPD): COPD is a chronic lung condition that makes it hard to breathe. You should continue using Trelegy daily and your Albuterol rescue inhaler as needed.  -HISTORY OF PROSTATE CANCER: Your prostate cancer is not currently causing any issues, so no changes are needed at this time.  INSTRUCTIONS:  Please ensure you attend the bronchoscopy appointment scheduled for the first week of December. Continue taking your COPD medications as prescribed. If you experience any new or worsening symptoms, contact our office immediately.

## 2023-09-11 ENCOUNTER — Other Ambulatory Visit: Payer: Self-pay

## 2023-09-11 ENCOUNTER — Encounter (HOSPITAL_COMMUNITY): Payer: Self-pay | Admitting: Emergency Medicine

## 2023-09-11 NOTE — Progress Notes (Signed)
PCP - Dr Avon Gully Cardiologist - none Internal Med - Dr Kari Baars  Chest x-ray - 08/06/23 EKG - 07/07/23 Stress Test - n/a ECHO - n/a Cardiac Cath - n/a  ICD Pacemaker/Loop - n/a  Sleep Study -  n/a  Diabetes - n/a  NPO    STOP now taking any Aspirin (unless otherwise instructed by your surgeon), Aleve, Naproxen, Ibuprofen, Motrin, Naprosyn, Advil, Goody's, BC's, all herbal medications, fish oil, and all vitamins.   Coronavirus Screening Do you have any of the following symptoms:  Cough yes/no: No Fever (>100.56F)  yes/no: No Runny nose yes/no: No Sore throat yes/no: No Difficulty breathing/shortness of breath  yes/no: No  Have you traveled in the last 14 days and where? yes/no: No  Patient verbalized understanding of instructions that were given via phone.

## 2023-09-18 ENCOUNTER — Ambulatory Visit (HOSPITAL_COMMUNITY): Payer: Medicare HMO | Admitting: Physician Assistant

## 2023-09-18 ENCOUNTER — Ambulatory Visit (HOSPITAL_COMMUNITY): Payer: Medicare HMO

## 2023-09-18 ENCOUNTER — Encounter (HOSPITAL_COMMUNITY): Payer: Self-pay | Admitting: Emergency Medicine

## 2023-09-18 ENCOUNTER — Ambulatory Visit (HOSPITAL_COMMUNITY)
Admission: RE | Admit: 2023-09-18 | Discharge: 2023-09-18 | Disposition: A | Discharge status: Home / Self Care | Payer: Medicare HMO | Attending: Emergency Medicine | Admitting: Emergency Medicine

## 2023-09-18 ENCOUNTER — Encounter (HOSPITAL_COMMUNITY)
Admission: RE | Disposition: A | Discharge status: Home / Self Care | Payer: Self-pay | Source: Home / Self Care | Attending: Emergency Medicine

## 2023-09-18 DIAGNOSIS — Z87891 Personal history of nicotine dependence: Secondary | ICD-10-CM | POA: Insufficient documentation

## 2023-09-18 DIAGNOSIS — Z8701 Personal history of pneumonia (recurrent): Secondary | ICD-10-CM | POA: Insufficient documentation

## 2023-09-18 DIAGNOSIS — R911 Solitary pulmonary nodule: Secondary | ICD-10-CM | POA: Insufficient documentation

## 2023-09-18 DIAGNOSIS — J45909 Unspecified asthma, uncomplicated: Secondary | ICD-10-CM | POA: Diagnosis not present

## 2023-09-18 DIAGNOSIS — Z5986 Financial insecurity: Secondary | ICD-10-CM | POA: Diagnosis not present

## 2023-09-18 DIAGNOSIS — J449 Chronic obstructive pulmonary disease, unspecified: Secondary | ICD-10-CM | POA: Diagnosis not present

## 2023-09-18 DIAGNOSIS — Z8546 Personal history of malignant neoplasm of prostate: Secondary | ICD-10-CM | POA: Insufficient documentation

## 2023-09-18 DIAGNOSIS — R918 Other nonspecific abnormal finding of lung field: Secondary | ICD-10-CM | POA: Diagnosis not present

## 2023-09-18 DIAGNOSIS — Z48813 Encounter for surgical aftercare following surgery on the respiratory system: Secondary | ICD-10-CM | POA: Diagnosis not present

## 2023-09-18 DIAGNOSIS — Z7951 Long term (current) use of inhaled steroids: Secondary | ICD-10-CM | POA: Diagnosis not present

## 2023-09-18 DIAGNOSIS — R846 Abnormal cytological findings in specimens from respiratory organs and thorax: Secondary | ICD-10-CM | POA: Diagnosis not present

## 2023-09-18 HISTORY — PX: BRONCHIAL NEEDLE ASPIRATION BIOPSY: SHX5106

## 2023-09-18 HISTORY — PX: BRONCHIAL BIOPSY: SHX5109

## 2023-09-18 HISTORY — PX: FIDUCIAL MARKER PLACEMENT: SHX6858

## 2023-09-18 HISTORY — PX: BRONCHIAL BRUSHINGS: SHX5108

## 2023-09-18 LAB — BASIC METABOLIC PANEL
Anion gap: 9 (ref 5–15)
BUN: 6 mg/dL — ABNORMAL LOW (ref 8–23)
CO2: 22 mmol/L (ref 22–32)
Calcium: 8.4 mg/dL — ABNORMAL LOW (ref 8.9–10.3)
Chloride: 103 mmol/L (ref 98–111)
Creatinine, Ser: 0.86 mg/dL (ref 0.61–1.24)
GFR, Estimated: >60 mL/min (ref >60)
Glucose, Bld: 89 mg/dL (ref 70–99)
Potassium: 4.2 mmol/L (ref 3.5–5.1)
Sodium: 134 mmol/L — ABNORMAL LOW (ref 135–145)

## 2023-09-18 LAB — CBC
HCT: 46.7 % (ref 39.0–52.0)
Hemoglobin: 15 g/dL (ref 13.0–17.0)
MCH: 32.1 pg (ref 26.0–34.0)
MCHC: 32.1 g/dL (ref 30.0–36.0)
MCV: 100 fL (ref 80.0–100.0)
Platelets: 245 10*3/uL (ref 150–400)
RBC: 4.67 MIL/uL (ref 4.22–5.81)
RDW: 14.1 % (ref 11.5–15.5)
WBC: 5.5 10*3/uL (ref 4.0–10.5)
nRBC: 0 % (ref 0.0–0.2)

## 2023-09-18 SURGERY — BRONCHOSCOPY, WITH BIOPSY USING ELECTROMAGNETIC NAVIGATION
Anesthesia: General | Laterality: Right

## 2023-09-18 MED ORDER — ROCURONIUM BROMIDE 10 MG/ML (PF) SYRINGE
PREFILLED_SYRINGE | INTRAVENOUS | Status: DC | PRN
  Administered 2023-09-18: 70 mg via INTRAVENOUS

## 2023-09-18 MED ORDER — PHENYLEPHRINE HCL-NACL 20-0.9 MG/250ML-% IV SOLN
INTRAVENOUS | Status: DC | PRN
  Administered 2023-09-18: 80 ug via INTRAVENOUS
  Administered 2023-09-18 (×3): 160 ug via INTRAVENOUS

## 2023-09-18 MED ORDER — CHLORHEXIDINE GLUCONATE 0.12 % MT SOLN
OROMUCOSAL | Status: AC
  Administered 2023-09-18: 15 mL via OROMUCOSAL
  Filled 2023-09-18: qty 15

## 2023-09-18 MED ORDER — FENTANYL CITRATE (PF) 100 MCG/2ML IJ SOLN
INTRAMUSCULAR | Status: AC
  Filled 2023-09-18: qty 2

## 2023-09-18 MED ORDER — LACTATED RINGERS IV SOLN: Freq: Once | INTRAVENOUS | Status: AC

## 2023-09-18 MED ORDER — ONDANSETRON HCL 4 MG/2ML IJ SOLN: 4.0000 mg | Freq: Once | INTRAMUSCULAR | Status: DC | PRN

## 2023-09-18 MED ORDER — DEXAMETHASONE SODIUM PHOSPHATE 10 MG/ML IJ SOLN
INTRAMUSCULAR | Status: DC | PRN
  Administered 2023-09-18: 10 mg via INTRAVENOUS

## 2023-09-18 MED ORDER — ONDANSETRON HCL 4 MG/2ML IJ SOLN
INTRAMUSCULAR | Status: DC | PRN
  Administered 2023-09-18: 4 mg via INTRAVENOUS

## 2023-09-18 MED ORDER — SUGAMMADEX SODIUM 200 MG/2ML IV SOLN
INTRAVENOUS | Status: DC | PRN
  Administered 2023-09-18: 200 mg via INTRAVENOUS

## 2023-09-18 MED ORDER — LIDOCAINE 2% (20 MG/ML) 5 ML SYRINGE
INTRAMUSCULAR | Status: DC | PRN
  Administered 2023-09-18: 80 mg via INTRAVENOUS

## 2023-09-18 MED ORDER — SODIUM CHLORIDE 0.9 % IV SOLN: INTRAVENOUS | Status: DC | PRN

## 2023-09-18 MED ORDER — MIDAZOLAM HCL 2 MG/2ML IJ SOLN
INTRAMUSCULAR | Status: AC
  Filled 2023-09-18: qty 2

## 2023-09-18 MED ORDER — FENTANYL CITRATE (PF) 250 MCG/5ML IJ SOLN
INTRAMUSCULAR | Status: DC | PRN
  Administered 2023-09-18: 100 ug via INTRAVENOUS

## 2023-09-18 MED ORDER — FENTANYL CITRATE (PF) 100 MCG/2ML IJ SOLN: 25.0000 ug | INTRAMUSCULAR | Status: DC | PRN

## 2023-09-18 MED ORDER — PROPOFOL 10 MG/ML IV BOLUS
INTRAVENOUS | Status: DC | PRN
  Administered 2023-09-18: 125 ug/kg/min via INTRAVENOUS
  Administered 2023-09-18: 200 mg via INTRAVENOUS

## 2023-09-18 MED ORDER — OXYCODONE HCL 5 MG PO TABS: 5.0000 mg | ORAL_TABLET | Freq: Once | ORAL | Status: DC | PRN

## 2023-09-18 MED ORDER — OXYCODONE HCL 5 MG/5ML PO SOLN: 5.0000 mg | Freq: Once | ORAL | Status: DC | PRN

## 2023-09-18 MED ORDER — LACTATED RINGERS IV SOLN: INTRAVENOUS | Status: DC | PRN

## 2023-09-18 MED ORDER — MIDAZOLAM HCL 2 MG/2ML IJ SOLN
INTRAMUSCULAR | Status: DC | PRN
  Administered 2023-09-18: 2 mg via INTRAVENOUS

## 2023-09-18 MED ORDER — CHLORHEXIDINE GLUCONATE 0.12 % MT SOLN: 15.0000 mL | OROMUCOSAL | Status: AC

## 2023-09-18 SURGICAL SUPPLY — 1 items: superlock fiducial IMPLANT

## 2023-09-18 NOTE — Discharge Instructions (Addendum)
Flexible Bronchoscopy, Care After This sheet gives you information about how to care for yourself after your test. Your doctor may also give you more specific instructions. If you have problems or questions, contact your doctor. Follow these instructions at home: Eating and drinking When your numbness is gone and your cough and gag reflexes have come back, you may: Eat only soft foods. Slowly drink liquids. The day after the test, go back to your normal diet. Driving Do not drive for 24 hours if you were given a medicine to help you relax (sedative). Do not drive or use heavy machinery while taking prescription pain medicine. General instructions  Take over-the-counter and prescription medicines only as told by your doctor. Return to your normal activities as told. Ask what activities are safe for you. Do not use any products that have nicotine or tobacco in them. This includes cigarettes and e-cigarettes. If you need help quitting, ask your doctor. Keep all follow-up visits as told by your doctor. This is important. It is very important if you had a tissue sample (biopsy) taken. Get help right away if: You have shortness of breath that gets worse. You get light-headed. You feel like you are going to pass out (faint). You have chest pain. You cough up: More than a little blood. More blood than before. Summary Do not eat or drink anything (not even water) for 2 hours after your test, or until your numbing medicine wears off. Do not use cigarettes. Do not use e-cigarettes. Get help right away if you have chest pain.  Please call our office for any questions or concerns.  [phone].  This information is not intended to replace advice given to you by your health care provider. Make sure you discuss any questions you have with your health care provider. Document Released: 07/31/2009 Document Revised: 09/15/2017 Document Reviewed: 10/21/2016 Elsevier Patient Education  2020 [employer].

## 2023-09-18 NOTE — Interval H&P Note (Signed)
History and Physical Interval Note:  09/18/2023 9:52 AM  Jerome Daniel  has presented today for surgery, with the diagnosis of RIGHT MIDDLE LOBE MASS.  The various methods of treatment have been discussed with the patient and family. After consideration of risks, benefits and other options for treatment, the patient has consented to  Procedure(s): ROBOTIC ASSISTED NAVIGATIONAL BRONCHOSCOPY (Right) as a surgical intervention.  The patient's history has been reviewed, patient examined, no change in status, stable for surgery.  I have reviewed the patient's chart and labs.  Questions were answered to the patient's satisfaction.     Leslye Peer

## 2023-09-18 NOTE — Transfer of Care (Signed)
Immediate Anesthesia Transfer of Care Note  Patient: Jerome Daniel  Procedure(s) Performed: ROBOTIC ASSISTED NAVIGATIONAL BRONCHOSCOPY (Right) BRONCHIAL BRUSHINGS BRONCHIAL BIOPSIES BRONCHIAL NEEDLE ASPIRATION BIOPSIES FIDUCIAL MARKER PLACEMENT  Patient Location: PACU  Anesthesia Type:General  Level of Consciousness: drowsy and patient cooperative  Airway & Oxygen Therapy: Patient Spontanous Breathing  Post-op Assessment: Report given to RN and Post -op Vital signs reviewed and stable  Post vital signs: Reviewed and stable  Last Vitals:  Vitals Value Taken Time  BP 111/67 09/18/23 1306  Temp    Pulse 74 09/18/23 1309  Resp 20 09/18/23 1309  SpO2 97 % 09/18/23 1309  Vitals shown include unfiled device data.  Last Pain:  Vitals:   09/18/23 0830  TempSrc:   PainSc: 0-No pain      Patients Stated Pain Goal: 0 (09/18/23 0830)  Complications: No notable events documented.

## 2023-09-18 NOTE — Anesthesia Procedure Notes (Addendum)
Procedure Name: Intubation Date/Time: 09/18/2023 11:32 AM  Performed by: Susy Manor, CRNAPre-anesthesia Checklist: Patient identified, Emergency Drugs available, Suction available and Patient being monitored Patient Re-evaluated:Patient Re-evaluated prior to induction Oxygen Delivery Method: Circle System Utilized Preoxygenation: Pre-oxygenation with 100% oxygen Induction Type: IV induction Ventilation: Mask ventilation without difficulty Laryngoscope Size: Mac and 4 Grade View: Grade II Tube type: Oral Tube size: 8.0 mm Number of attempts: 2 Airway Equipment and Method: Stylet and Oral airway Placement Confirmation: ETT inserted through vocal cords under direct vision, positive ETCO2 and breath sounds checked- equal and bilateral Secured at: 23 cm Tube secured with: Tape Dental Injury: Teeth and Oropharynx as per pre-operative assessment

## 2023-09-18 NOTE — Op Note (Signed)
Video Bronchoscopy with Robotic Assisted Bronchoscopic Navigation   Date of Operation: 09/18/2023   Pre-op Diagnosis: Right middle lobe opacity, right upper lobe nodule  Post-op Diagnosis: Same  Surgeon: Levy Pupa  Assistants: None  Anesthesia: General endotracheal anesthesia  Operation: Flexible video fiberoptic bronchoscopy with robotic assistance and biopsies.  Estimated Blood Loss: Minimal  Complications: None  Indications and History: Jerome Daniel is a 62 y.o. male with history of tobacco use.  He underwent chest imaging to evaluate chest pain and dyspnea.  This showed a right lower lobe opacity that persisted after antibiotics, was hypermetabolic on PET scan performed 08/17/2023.  There was also persistent right upper lobe pulmonary nodule that was borderline for hypermetabolism.  Recommendation made to achieve a tissue diagnosis in both areas via robotic assisted navigational bronchoscopy. The risks, benefits, complications, treatment options and expected outcomes were discussed with the patient.  The possibilities of pneumothorax, pneumonia, reaction to medication, pulmonary aspiration, perforation of a viscus, bleeding, failure to diagnose a condition and creating a complication requiring transfusion or operation were discussed with the patient who freely signed the consent.    Description of Procedure: The patient was seen in the Preoperative Area, was examined and was deemed appropriate to proceed.  The patient was taken to [facility] endoscopy room 3, identified as Jerome Daniel and the procedure verified as Flexible Video Fiberoptic Bronchoscopy.  A Time Out was held and the above information confirmed.   Prior to the date of the procedure a high-resolution CT scan of the chest was performed. Utilizing ION software program a virtual tracheobronchial tree was generated to allow the creation of distinct navigation pathways to the patient's parenchymal abnormalities. After being taken  to the operating room general anesthesia was initiated and the patient  was orally intubated. The video fiberoptic bronchoscope was introduced via the endotracheal tube and a general inspection was performed which showed normal left lung anatomy.  The right sided airways were normal with the exception of some mild narrowing of the right middle lobe airway.  The scope was passed into the airway and the segmental airways appeared normal without any evidence of endobronchial lesion. Aspiration of the bilateral mainstems was completed to remove any remaining secretions. Robotic catheter inserted into patient's endotracheal tube.   Target #1 right middle lobe opacity: The distinct navigation pathways prepared prior to this procedure were then utilized to navigate to patient's lesion identified on CT scan. The robotic catheter was secured into place and the vision probe was withdrawn.  Lesion location was approximated using fluoroscopy.  Local registration and targeting was performed using Cios three-dimensional imaging.  Under fluoroscopic guidance transbronchial needle brushings, transbronchial needle biopsies, and transbronchial forceps biopsies were performed to be sent for cytology and pathology.  Brushings and forceps biopsies were performed via standard scope as well under fluoroscopic guidance.   Target #2 right upper lobe nodule: The distinct navigation pathways prepared prior to this procedure were then utilized to navigate to patient's lesion identified on CT scan. The robotic catheter was secured into place and the vision probe was withdrawn.  Lesion location was approximated using fluoroscopy.  Local registration and targeting was performed using Cios three-dimensional imaging. Under fluoroscopic guidance transbronchial needle brushings, transbronchial needle biopsies, and transbronchial forceps biopsies were performed to be sent for cytology and pathology.  Needle in lesion was confirmed using  Cios-spin.  Under fluoroscopic guidance a single fiducial marker was placed adjacent to the nodule.  At the end of the procedure a  general airway inspection was performed and there was no evidence of active bleeding. The bronchoscope was removed.  The patient tolerated the procedure well. There was no significant blood loss and there were no obvious complications. A post-procedural chest x-ray is pending.  Samples Target #1: 1. Transbronchial needle brushings from right middle lobe opacity 2. Transbronchial forceps biopsies from right middle lobe opacity  Samples Target #2: 1. Transbronchial needle brushings from right upper lobe nodule 2. Transbronchial Wang needle biopsies from right upper lobe nodule 3. Transbronchial forceps biopsies from right upper lobe nodule   Plans:  The patient will be discharged from the PACU to home when recovered from anesthesia and after chest x-ray is reviewed. We will review the cytology, pathology results with the patient when they become available. Outpatient followup will be with Dr. Delton Coombes and Saralyn Pilar, NP.    Levy Pupa, MD, PhD 09/18/2023, 1:00 PM [facility] [phone] or if no answer before 7:00PM call [phone] For any issues after 7:00PM please call eLink [phone]

## 2023-09-18 NOTE — Anesthesia Postprocedure Evaluation (Signed)
Anesthesia Post Note  Patient: MCCARTNEY MONTOUR  Procedure(s) Performed: ROBOTIC ASSISTED NAVIGATIONAL BRONCHOSCOPY (Right) BRONCHIAL BRUSHINGS BRONCHIAL BIOPSIES BRONCHIAL NEEDLE ASPIRATION BIOPSIES FIDUCIAL MARKER PLACEMENT     Patient location during evaluation: PACU Anesthesia Type: General Level of consciousness: awake and alert and oriented Pain management: pain level controlled Vital Signs Assessment: post-procedure vital signs reviewed and stable Respiratory status: spontaneous breathing, nonlabored ventilation and respiratory function stable Cardiovascular status: blood pressure returned to baseline and stable Postop Assessment: no apparent nausea or vomiting Anesthetic complications: no   No notable events documented.  Last Vitals:  Vitals:   09/18/23 1330 09/18/23 1345  BP: 103/61 121/83  Pulse: 66 69  Resp: 15 20  Temp:  36.5 C  SpO2: 93% 99%    Last Pain:  Vitals:   09/18/23 1310  TempSrc:   PainSc: Asleep                 Khori Rosevear A.

## 2023-09-18 NOTE — Anesthesia Preprocedure Evaluation (Signed)
Anesthesia Evaluation  Patient identified by MRN, date of birth, ID band Patient awake    Reviewed: Allergy & Precautions, NPO status , Patient's Chart, lab work & pertinent test results, reviewed documented beta blocker date and time   Airway Mallampati: II  TM Distance: >3 FB     Dental  (+) Missing, Poor Dentition, Chipped, Dental Advisory Given   Pulmonary asthma , pneumonia, resolved, COPD,  COPD inhaler, former smoker Right middle lobe mass   breath sounds clear to auscultation + decreased breath sounds      Cardiovascular negative cardio ROS Normal cardiovascular exam Rhythm:Regular Rate:Normal     Neuro/Psych negative neurological ROS  negative psych ROS   GI/Hepatic negative GI ROS,,,(+)     substance abuse  alcohol use and cocaine use  Endo/Other  Obesity  Renal/GU negative Renal ROS  negative genitourinary   Musculoskeletal negative musculoskeletal ROS (+)  narcotic dependent  Abdominal   Peds  Hematology negative hematology ROS (+)   Anesthesia Other Findings   Reproductive/Obstetrics                              Anesthesia Physical Anesthesia Plan  ASA: 3  Anesthesia Plan: General   Post-op Pain Management: Minimal or no pain anticipated   Induction: Intravenous  PONV Risk Score and Plan: 3 and Treatment may vary due to age or medical condition, TIVA and Midazolam  Airway Management Planned: Oral ETT  Additional Equipment: None  Intra-op Plan:   Post-operative Plan: Extubation in OR  Informed Consent: I have reviewed the patients History and Physical, chart, labs and discussed the procedure including the risks, benefits and alternatives for the proposed anesthesia with the patient or authorized representative who has indicated his/her understanding and acceptance.     Dental advisory given  Plan Discussed with: Anesthesiologist and CRNA  Anesthesia Plan  Comments:          Anesthesia Quick Evaluation

## 2023-09-20 ENCOUNTER — Encounter: Payer: Self-pay | Admitting: *Deleted

## 2023-09-20 LAB — CYTOLOGY - NON PAP

## 2023-09-20 NOTE — Progress Notes (Addendum)
Patient to be scheduled for follow up with Dr. Ellin Saba following Biopsy, PET scan and MRI brain.  Patient had cancelled MRI.  Called patient to schedule him for scan and follow up.  Number was given to central scheduling, however patient did not call to schedule.  Attempt made again to assist him with scheduling and he stated did not have time.    Patient was contacted again and appointment made.  Per Dr. Ellin Saba, MRI was not needed prior to appointment.

## 2023-09-21 ENCOUNTER — Encounter (HOSPITAL_COMMUNITY): Payer: Self-pay | Admitting: Emergency Medicine

## 2023-09-26 ENCOUNTER — Encounter: Payer: Self-pay | Admitting: Acute Care

## 2023-09-26 ENCOUNTER — Ambulatory Visit: Payer: Medicare HMO | Admitting: Acute Care

## 2023-09-26 VITALS — BP 102/78 | HR 71 | Ht 68.0 in | Wt 215.2 lb

## 2023-09-26 DIAGNOSIS — J441 Chronic obstructive pulmonary disease with (acute) exacerbation: Secondary | ICD-10-CM | POA: Diagnosis not present

## 2023-09-26 DIAGNOSIS — R911 Solitary pulmonary nodule: Secondary | ICD-10-CM | POA: Diagnosis not present

## 2023-09-26 MED ORDER — DOXYCYCLINE HYCLATE 100 MG PO TABS: 100.0000 mg | ORAL_TABLET | Freq: Two times a day (BID) | ORAL | 0 refills | Status: AC
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
## 2023-09-26 NOTE — Patient Instructions (Addendum)
It is good to see you today. I am glad you have done well after the procedure.  Your biopsy did not show a definitive diagnosis of lung cancer, but it did show atypical cells which are not normal cells.  Plan will be for a 3 month follow up CT chest. Dr. Ellin Saba and Delton Coombes may feel we should treat this despite the biopsy results. If that is the case we will place the appropriate referrals at the time. Follow up with Dr.Katragadda tomorrow as scheduled.  I am going to treat you for the discolored secretions you are having, I have sent in an antibiotic , Doxycycline . Take 1 tablet twice daily for 7 days. Please eat  Activia Yogurt twice daily while on antibiotic.  This will help prevent stomach upset. Continue Trelegy 1 puff once daily . Rinse mouth after use.  Have a good holiday. Please contact office for sooner follow up if symptoms do not improve or worsen or seek emergency care

## 2023-09-26 NOTE — Progress Notes (Signed)
History of Present Illness Jerome Daniel is a 62 y.o. male former smoker with history of prior stage 2C prostate cancer, COPD, referred to Dr. Delton Daniel for lung nodule evaluation.   Synopsis A 62 year old individual with a history of tobacco use (20+ pk-yrs), prior stage 2C prostate cancer, COPD, and substance abuse presented with right-sided chest wall pain. He reported a history of recurrent pneumonia and described the current pain as similar to previous pneumonia episodes. The pain was localized to the right side of the chest, and was associated with green sputum production, but no hemoptysis. The pt reported a slight discomfort on deep inhalation, but overall, the symptoms had improved.   A CT angiogram revealed a right middle lobe consolidation associated with occlusion of the right middle lobe bronchus and local extension into the right upper lobe, suggestive of malignancy. Enlarged subcarotid artery and right hilar lymphadenopathy were also noted. A subsequent PET scan confirmed the presence of a hypermetabolic mass lesion in the right middle lobe, with no hypermetabolic mediastinal nodes.   The patient  had a history of smoking approximately a pack and a half to two packs of cigarettes a day for five to six years, but had quit smoking in 2017. They have a  diagnosis of COPD. 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 The patient  was on Trelegy for COPD management and used a rescue inhaler one to two times a day as needed.  Pt was scheduled for a robotic assisted bronchoscopy with biopsies in 09/18/2023 for diagnosis.     09/26/2023 Pt. Presents for follow up after robotic assisted bronchoscopy with biopsies on 09/18/2023. He states he has done well since the procedure. No bleeding, fever, discolored secretions , no shortness of breath, and no anesthesia reaction.  We have discussed his cytology results. They are positive for  atypical cells, but no definitive diagnosis of malignancy. With imaging I am suspicious that this is a cancer. Plan will be for a 3 month follow up CT chest unless Thoracic Conference makes other recommendations. I will plan to discuss  12/12 with the expert group.  Dr. Delton Daniel and I discussed possible TTNA as an option for additional tissue sampling.   Patient has an appointment with Dr. Ellin Daniel tomorrow , and I have encouraged him to go to this appointment , as Dr. Ellin Daniel can also advise recommendations  regarding  surveillance vs treatment. I will present the patient at the thoracic conference this Thursday morning at 7 AM.  This group can give [place] a consensus opinion regarding short-term follow-up versus referral to radiation oncology/surgery  after reviewing imaging and cytology.  Test Results: Cytology A. LUNG, RML MASS, BRUSHING:  - No malignant cells identified  - Reactive bronchial cells   B. LUNG, RML MASS BX:  - Atypical cells present, favor reactive  - Chronic inflammation   C. LUNG, RUL TARGET 2, BRUSHING:  - No malignant cells identified  - Reactive bronchial cells   D. LUNG, RUL TARGET 2, FINE NEEDLE ASPIRATION  BIOPSY:  - No malignant cells identified  - Benign bronchial cells      Latest Ref Rng & Units 09/18/2023    8:17 AM 08/06/2023    4:23 PM 07/07/2023    2:16 PM  CBC  WBC 4.0 - 10.5 K/uL 5.5  7.7  7.9   Hemoglobin 13.0 - 17.0 g/dL 40.9  81.1  91.4   Hematocrit 39.0 - 52.0 %  46.7  45.9  40.7   Platelets 150 - 400 K/uL 245  168  290        Latest Ref Rng & Units 09/18/2023    8:17 AM 08/06/2023    4:23 PM 07/07/2023    2:16 PM  BMP  Glucose 70 - 99 mg/dL 89  95  161   BUN 8 - 23 mg/dL 6  8  10    Creatinine 0.61 - 1.24 mg/dL 0.96  0.45  4.09   Sodium 135 - 145 mmol/L 134  131  137   Potassium 3.5 - 5.1 mmol/L 4.2  3.9  3.4   Chloride 98 - 111 mmol/L 103  98  104   CO2 22 - 32 mmol/L 22  26  25    Calcium 8.9 - 10.3 mg/dL 8.4  8.2  8.2     BNP No  results found for: "BNP"  ProBNP    Component Value Date/Time   PROBNP 28.6 06/08/2014 1702    PFT No results found for: "FEV1PRE", "FEV1POST", "FVCPRE", "FVCPOST", "TLC", "DLCOUNC", "PREFEV1FVCRT", "PSTFEV1FVCRT"  DG Chest Port 1 View  Result Date: 09/18/2023 CLINICAL DATA:  Post bronchoscopy and biopsy EXAM: PORTABLE CHEST 1 VIEW COMPARISON:  CT chest 08/06/2023 FINDINGS: Decreasing consolidated pattern RIGHT middle lobe seen on prior. Biopsy clip noted in the RIGHT upper lobe. No pneumothorax. Pleural fluid. IMPRESSION: No complication following bronchoscopy. Electronically Signed   By: Genevive Bi M.D.   On: 09/18/2023 16:36   DG C-ARM BRONCHOSCOPY  Result Date: 09/18/2023 C-ARM BRONCHOSCOPY: Fluoroscopy was utilized by the requesting physician.  No radiographic interpretation.     Past medical hx Past Medical History:  Diagnosis Date   Cancer (HCC) 08/2023   Prostate   Chronic dental pain    COPD (chronic obstructive pulmonary disease) (HCC)    Pneumonia    x several   Polysubstance abuse (HCC)    etoh, rx narcotics ("buy them off the street"), cocaine     Social History   Tobacco Use   Smoking status: Former    Current packs/day: 0.00    Types: Cigarettes    Quit date: 12/21/2015    Years since quitting: 7.7   Smokeless tobacco: Current    Types: Snuff  Vaping Use   Vaping status: Never Used  Substance Use Topics   Alcohol use: Yes    Alcohol/week: 42.0 standard drinks of alcohol    Types: 42 Cans of beer per week    Comment: 6 pack of beer a day.   Drug use: Not Currently    Types: Cocaine, Other-see comments    Comment: last used illicit drugs more than 10 years ago. Will take a pain pill here and there.    Mr.Jerome Daniel reports that he quit smoking about 7 years ago. His smoking use included cigarettes. His smokeless tobacco use includes snuff. He reports current alcohol use of about 42.0 standard drinks of alcohol per week. He reports that he does not  currently use drugs after having used the following drugs: Cocaine and Other-see comments.  Tobacco Cessation: Former smoker , quit 2009 Past surgical hx, Family hx, Social hx all reviewed.  Current Outpatient Medications on File Prior to Visit  Medication Sig   albuterol (VENTOLIN HFA) 108 (90 Base) MCG/ACT inhaler Inhale 2 puffs into the lungs every 4 (four) hours as needed for wheezing or shortness of breath.   TRELEGY ELLIPTA 100-62.5-25 MCG/ACT AEPB Inhale 1 puff into the lungs daily.   No current facility-administered  medications on file prior to visit.     No Known Allergies  Review Of Systems:  Constitutional:   No  weight loss, night sweats,  Fevers, chills, fatigue, or  lassitude.  HEENT:   No headaches,  Difficulty swallowing,  Tooth/dental problems, or  Sore throat,                No sneezing, itching, ear ache, nasal congestion, post nasal drip,   CV:  No chest pain,  Orthopnea, PND, swelling in lower extremities, anasarca, dizziness, palpitations, syncope.   GI  No heartburn, indigestion, abdominal pain, nausea, vomiting, diarrhea, change in bowel habits, loss of appetite, bloody stools.   Resp: No shortness of breath with exertion or at rest.  No excess mucus, no productive cough,  No non-productive cough,  No coughing up of blood.  No change in color of mucus.  No wheezing.  No chest wall deformity  Skin: no rash or lesions.  GU: no dysuria, change in color of urine, no urgency or frequency.  No flank pain, no hematuria   MS:  No joint pain or swelling.  No decreased range of motion.  No back pain.  Psych:  No change in mood or affect. No depression or anxiety.  No memory loss.   Vital Signs BP 102/78 (BP Location: Left Arm, Cuff Size: Large)   Pulse 71   Ht 5\' 8"  (1.727 m)   Wt 215 lb 3.2 oz (97.6 kg)   SpO2 100%   BMI 32.72 kg/m    Physical Exam:  General- No distress,  A&Ox3, appropriate ENT: No sinus tenderness, TM clear, pale nasal mucosa, no  oral exudate,no post nasal drip, no LAN Cardiac: S1, S2, regular rate and rhythm, no murmur Chest: No wheeze/ rales/ dullness; no accessory muscle use, no nasal flaring, no sternal retractions, diminished per bases bilaterally, wheezes and rhonchi noted.  Abd.: Soft Non-tender, ND, BS +, Body mass index is 32.72 kg/m.  Ext: No clubbing cyanosis, edema Neuro:  normal strength, MAE x 4, A&O x 3 Skin: No rashes, warm and dry, no lesions  Psych: normal mood and behavior   Assessment/Plan  Pulmonary Nodule Atypical cells on bronchoscopy, not definitive for cancer.  History of multiple pneumonias.  Discussed the possibility of discussing the case at the thoracic conference for further input. Plan -Plan for a 13-month follow-up CT scan unless advised otherwise by the thoracic conference/ Dr. Ellin Daniel. - MR Brain as ordered by Dr. Ellin Daniel -If recommended by the thoracic conference, Dr. Ellin Daniel , consider referral to radiation oncology for radiation treatment.  Chronic Bronchitis/ COPD Flare Reports green sputum. No fever or increased shortness of breath.  Currently on Trelegy with good control. -Start Doxycycline for 7 days for possible infection. - Take 1 tablet twice daily for 7 days. -Advise to take probiotics or Activia yogurt twice daily while on antibiotics to prevent stomach upset.  Smoking Cessation Quit smoking approximately 15 years ago. -Continue smoking cessation.  Follow-up -See Dr. Ellin Daniel tomorrow for further discussion. -If any changes to the plan, the patient will be contacted.  I spent 30 minutes dedicated to the care of this patient on the date of this encounter to include pre-visit review of records, face-to-face time with the patient discussing conditions above, post visit ordering of testing, clinical documentation with the electronic health record, making appropriate referrals as documented, and communicating necessary information to the patient's  healthcare team.    Bevelyn Ngo, NP 09/26/2023  11:14 AM

## 2023-09-27 ENCOUNTER — Inpatient Hospital Stay: Payer: Medicare HMO | Attending: Hematology | Admitting: Hematology

## 2023-09-27 VITALS — BP 98/65 | HR 98 | Temp 97.5°F | Resp 20 | Wt 217.0 lb

## 2023-09-27 DIAGNOSIS — R918 Other nonspecific abnormal finding of lung field: Secondary | ICD-10-CM | POA: Diagnosis not present

## 2023-09-27 DIAGNOSIS — Z87891 Personal history of nicotine dependence: Secondary | ICD-10-CM | POA: Insufficient documentation

## 2023-09-27 NOTE — Patient Instructions (Addendum)
[facility] at [facility] Discharge Instructions   You were seen and examined today by Dr. Ellin Saba.  He reviewed the results of your PET scan. It is showing a mass in right lung that is lighting up that is highly suspicious for cancer, even though the biopsy that was done did not show cancer cells. There is no evidence of cancer spread to the lymph nodes or any other parts of you body.   We will need to get another biopsy to confirm the diagnosis and determine what would be the best treatment for your.   Please have brain MRI done. We will get this scheduled for you today.  We will reach out to you after the tumor board discussion tomorrow to let you know what the next best steps are.      Thank you for choosing  [facility] at [facility] to provide your oncology and hematology care.  To afford each patient quality time with our provider, please arrive at least 15 minutes before your scheduled appointment time.   If you have a lab appointment with the Cancer Center please come in thru the Main Entrance and check in at the main information desk.  You need to re-schedule your appointment should you arrive 10 or more minutes late.  We strive to give you quality time with our providers, and arriving late affects you and other patients whose appointments are after yours.  Also, if you no show three or more times for appointments you may be dismissed from the clinic at the providers discretion.     Again, thank you for choosing [facility].  Our hope is that these requests will decrease the amount of time that you wait before being seen by our physicians.       _____________________________________________________________  Should you have questions after your visit to [facility], please contact our office at [phone] and follow the prompts.  Our office hours are 8:00 a.m. and 4:30 p.m. Monday - Friday.  Please note  that voicemails left after 4:00 p.m. may not be returned until the following business day.  We are closed weekends and major holidays.  You do have access to a nurse 24-7, just call the main number to the clinic [phone] and do not press any options, hold on the line and a nurse will answer the phone.    For prescription refill requests, have your pharmacy contact our office and allow 72 hours.    Due to Covid, you will need to wear a mask upon entering the hospital. If you do not have a mask, a mask will be given to you at the Main Entrance upon arrival. For doctor visits, patients may have 1 support person age 88 or older with them. For treatment visits, patients can not have anyone with them due to social distancing guidelines and our immunocompromised population.

## 2023-09-28 ENCOUNTER — Telehealth: Payer: Self-pay | Admitting: Acute Care

## 2023-09-28 ENCOUNTER — Other Ambulatory Visit: Payer: Self-pay | Admitting: Acute Care

## 2023-09-28 DIAGNOSIS — R918 Other nonspecific abnormal finding of lung field: Secondary | ICD-10-CM

## 2023-09-28 NOTE — Telephone Encounter (Addendum)
I have attempted to call the patient . There was no answer, and no option to leave a message( Mail Box was full). I have spoken with Dr. Delton Coombes and Ellin Saba and plan is for referral to thoracic surgery for evaluation and also to IR for CT Guided or TTNa Biopsy.  There is one emergency contact on his demographics. We will reach out to them and see if they can help [place] contact patient so he knows to expect calls to schedule.    I called the patient's Emergency contact number , his aunt. She had him call me back. I explained that I have spoken to Dr. Ellin Saba , and Dr. Delton Coombes, and plan is for referral to thoracic surgery and also to IR for biopsy if possible. I told him he would need to answer his phone so that when he is called to schedule these visits they can get scheduled.  He agrees with the plan and verbalized understanding that he needs to answer his phone to get the calls.   Nothing further is needed.

## 2023-09-28 NOTE — Progress Notes (Signed)
Jerome Come, MD  [name] OK for CT guided R lung mass Bx.  PET CT - 08/17/23 - image 71, series 16109  Negative bronch.  [name]       Previous Messages    ----- Message ----- From: [name] Sent: 09/28/2023   1:05 PM EST To: [name]; Ir Procedure Requests Subject: CT  Biopsy                                    Procedure : Ct Biopsy  Reason: Lung mass  History: Chest xray , Bronchoscopy, NM PET , CT angio chest PE  Provider : Bevelyn Ngo, NP  Provider contact: [phone]

## 2023-10-22 ENCOUNTER — Encounter (HOSPITAL_COMMUNITY): Payer: Self-pay | Admitting: Radiology

## 2023-10-23 ENCOUNTER — Ambulatory Visit (HOSPITAL_COMMUNITY): Admission: RE | Admit: 2023-10-23 | Payer: Medicare HMO | Source: Ambulatory Visit

## 2023-10-25 ENCOUNTER — Encounter: Payer: Medicare HMO | Admitting: Thoracic Surgery (Cardiothoracic Vascular Surgery)

## 2023-10-27 ENCOUNTER — Ambulatory Visit (HOSPITAL_COMMUNITY): Payer: Medicare HMO

## 2023-11-29 DIAGNOSIS — K219 Gastro-esophageal reflux disease without esophagitis: Secondary | ICD-10-CM | POA: Diagnosis not present

## 2023-11-29 DIAGNOSIS — J449 Chronic obstructive pulmonary disease, unspecified: Secondary | ICD-10-CM | POA: Diagnosis not present

## 2023-11-29 DIAGNOSIS — R918 Other nonspecific abnormal finding of lung field: Secondary | ICD-10-CM | POA: Diagnosis not present

## 2023-11-29 DIAGNOSIS — M545 Low back pain, unspecified: Secondary | ICD-10-CM | POA: Diagnosis not present

## 2023-11-29 DIAGNOSIS — Z1331 Encounter for screening for depression: Secondary | ICD-10-CM | POA: Diagnosis not present

## 2023-11-29 DIAGNOSIS — Z1389 Encounter for screening for other disorder: Secondary | ICD-10-CM | POA: Diagnosis not present

## 2023-11-29 DIAGNOSIS — Z0001 Encounter for general adult medical examination with abnormal findings: Secondary | ICD-10-CM | POA: Diagnosis not present

## 2023-11-29 DIAGNOSIS — I10 Essential (primary) hypertension: Secondary | ICD-10-CM | POA: Diagnosis not present

## 2024-01-05 ENCOUNTER — Ambulatory Visit (HOSPITAL_COMMUNITY)
Admission: RE | Admit: 2024-01-05 | Discharge: 2024-01-05 | Disposition: A | Discharge status: Home / Self Care | Source: Ambulatory Visit | Attending: Acute Care | Admitting: Acute Care

## 2024-01-05 DIAGNOSIS — J984 Other disorders of lung: Secondary | ICD-10-CM | POA: Diagnosis not present

## 2024-01-05 DIAGNOSIS — R911 Solitary pulmonary nodule: Secondary | ICD-10-CM | POA: Insufficient documentation

## 2024-01-05 DIAGNOSIS — J479 Bronchiectasis, uncomplicated: Secondary | ICD-10-CM | POA: Diagnosis not present

## 2024-01-05 DIAGNOSIS — R918 Other nonspecific abnormal finding of lung field: Secondary | ICD-10-CM | POA: Diagnosis not present

## 2024-01-16 ENCOUNTER — Ambulatory Visit: Admitting: Acute Care

## 2024-02-16 DIAGNOSIS — K219 Gastro-esophageal reflux disease without esophagitis: Secondary | ICD-10-CM | POA: Diagnosis not present

## 2024-02-16 DIAGNOSIS — Z1159 Encounter for screening for other viral diseases: Secondary | ICD-10-CM | POA: Diagnosis not present

## 2024-02-16 DIAGNOSIS — I10 Essential (primary) hypertension: Secondary | ICD-10-CM | POA: Diagnosis not present

## 2024-03-07 DIAGNOSIS — M545 Low back pain, unspecified: Secondary | ICD-10-CM | POA: Diagnosis not present

## 2024-03-07 DIAGNOSIS — I10 Essential (primary) hypertension: Secondary | ICD-10-CM | POA: Diagnosis not present

## 2024-03-07 DIAGNOSIS — K219 Gastro-esophageal reflux disease without esophagitis: Secondary | ICD-10-CM | POA: Diagnosis not present

## 2024-07-02 DIAGNOSIS — Z23 Encounter for immunization: Secondary | ICD-10-CM | POA: Diagnosis not present

## 2024-07-02 DIAGNOSIS — K219 Gastro-esophageal reflux disease without esophagitis: Secondary | ICD-10-CM | POA: Diagnosis not present

## 2024-07-02 DIAGNOSIS — I10 Essential (primary) hypertension: Secondary | ICD-10-CM | POA: Diagnosis not present

## 2024-07-02 DIAGNOSIS — R918 Other nonspecific abnormal finding of lung field: Secondary | ICD-10-CM | POA: Diagnosis not present

## 2024-07-02 DIAGNOSIS — J449 Chronic obstructive pulmonary disease, unspecified: Secondary | ICD-10-CM | POA: Diagnosis not present

## 2024-07-02 DIAGNOSIS — M2560 Stiffness of unspecified joint, not elsewhere classified: Secondary | ICD-10-CM | POA: Diagnosis not present

## 2024-08-01 DIAGNOSIS — J449 Chronic obstructive pulmonary disease, unspecified: Secondary | ICD-10-CM | POA: Diagnosis not present

## 2024-08-01 DIAGNOSIS — I10 Essential (primary) hypertension: Secondary | ICD-10-CM | POA: Diagnosis not present

## 2024-09-01 DIAGNOSIS — I10 Essential (primary) hypertension: Secondary | ICD-10-CM | POA: Diagnosis not present

## 2024-09-01 DIAGNOSIS — J449 Chronic obstructive pulmonary disease, unspecified: Secondary | ICD-10-CM | POA: Diagnosis not present

## 2024-09-26 NOTE — Progress Notes (Signed)
 Jerome Daniel                                          MRN: [id number]   09/26/2024   The [facility] Quality Team Specialist reviewed this patient medical record for the purposes of chart review for care gap closure. The following were reviewed: chart review for care gap closure-controlling blood pressure.    [facility] Quality Team
